# Patient Record
Sex: Female | Born: 1935
Health system: Southern US, Community
[De-identification: ages and names within clinical notes are randomized; demographics above are authoritative.]

## PROBLEM LIST (undated history)

## (undated) DIAGNOSIS — K219 Gastro-esophageal reflux disease without esophagitis: Secondary | ICD-10-CM

## (undated) DIAGNOSIS — F32A Depression, unspecified: Secondary | ICD-10-CM

## (undated) DIAGNOSIS — F329 Major depressive disorder, single episode, unspecified: Secondary | ICD-10-CM

## (undated) DIAGNOSIS — F419 Anxiety disorder, unspecified: Secondary | ICD-10-CM

## (undated) DIAGNOSIS — E7800 Pure hypercholesterolemia, unspecified: Secondary | ICD-10-CM

## (undated) DIAGNOSIS — G309 Alzheimer's disease, unspecified: Secondary | ICD-10-CM

## (undated) DIAGNOSIS — I10 Essential (primary) hypertension: Secondary | ICD-10-CM

## (undated) DIAGNOSIS — C50919 Malignant neoplasm of unspecified site of unspecified female breast: Secondary | ICD-10-CM

## (undated) DIAGNOSIS — F0280 Dementia in other diseases classified elsewhere without behavioral disturbance: Secondary | ICD-10-CM

## (undated) HISTORY — DX: Depression, unspecified: F32.A

## (undated) HISTORY — DX: Essential (primary) hypertension: I10

## (undated) HISTORY — DX: Major depressive disorder, single episode, unspecified: F32.9

## (undated) HISTORY — PX: EYE SURGERY: SHX253

## (undated) HISTORY — DX: Anxiety disorder, unspecified: F41.9

## (undated) HISTORY — DX: Pure hypercholesterolemia, unspecified: E78.00

---

## 1998-08-26 ENCOUNTER — Other Ambulatory Visit: Admission: RE | Admit: 1998-08-26 | Discharge: 1998-08-26 | Payer: Self-pay | Admitting: Obstetrics and Gynecology

## 1999-09-14 ENCOUNTER — Other Ambulatory Visit: Admission: RE | Admit: 1999-09-14 | Discharge: 1999-09-14 | Payer: Self-pay | Admitting: Obstetrics and Gynecology

## 2000-10-12 ENCOUNTER — Other Ambulatory Visit: Admission: RE | Admit: 2000-10-12 | Discharge: 2000-10-12 | Payer: Self-pay | Admitting: *Deleted

## 2002-10-10 ENCOUNTER — Encounter: Admission: RE | Admit: 2002-10-10 | Discharge: 2002-10-11 | Payer: Self-pay | Admitting: Family Medicine

## 2002-11-08 ENCOUNTER — Other Ambulatory Visit: Admission: RE | Admit: 2002-11-08 | Discharge: 2002-11-08 | Payer: Self-pay | Admitting: Obstetrics and Gynecology

## 2003-04-16 ENCOUNTER — Encounter: Admission: RE | Admit: 2003-04-16 | Discharge: 2003-07-15 | Payer: Self-pay | Admitting: Orthopedic Surgery

## 2003-06-18 ENCOUNTER — Encounter (INDEPENDENT_AMBULATORY_CARE_PROVIDER_SITE_OTHER): Payer: Self-pay | Admitting: Specialist

## 2003-06-18 ENCOUNTER — Ambulatory Visit (HOSPITAL_COMMUNITY): Admission: RE | Admit: 2003-06-18 | Discharge: 2003-06-18 | Payer: Self-pay | Admitting: Gastroenterology

## 2006-07-13 ENCOUNTER — Ambulatory Visit (HOSPITAL_COMMUNITY): Admission: RE | Admit: 2006-07-13 | Discharge: 2006-07-14 | Payer: Self-pay | Admitting: General Surgery

## 2006-07-13 ENCOUNTER — Encounter (INDEPENDENT_AMBULATORY_CARE_PROVIDER_SITE_OTHER): Payer: Self-pay | Admitting: Specialist

## 2007-06-27 ENCOUNTER — Encounter: Admission: RE | Admit: 2007-06-27 | Discharge: 2007-06-27 | Payer: Self-pay | Admitting: Gastroenterology

## 2010-08-28 NOTE — Op Note (Signed)
Shelly Cervantes, Shelly Cervantes                           ACCOUNT NO.:  [id number]   MEDICAL RECORD NO.:  [id number]                   PATIENT TYPE:  AMB   LOCATION:  ENDO                                 FACILITY:  [facility]   PHYSICIAN:  Petra Kuba, M.D.                 DATE OF BIRTH:  26-Oct-1935   DATE OF PROCEDURE:  06/18/2003  DATE OF DISCHARGE:                                 OPERATIVE REPORT   PROCEDURE:  Colonoscopy, hot biopsy.   INDICATIONS:  Screening.   Consent was signed after risks, benefits, methods, options thoroughly  discussed multiple times in the office.   MEDICINES USED:  Demerol 60, Versed 7.   PROCEDURE:  Rectal inspection was pertinent for external hemorrhoid, small.  Digital exam was negative.  A video pediatric adjustable colonoscope was  inserted.  There was some difficulty due to her inability to hold air and a  tortuous approximate level of the splenic flexure, which required first  rolling her on her back, then on her right side and then back to her left  side with various abdominal pressures; however, once by this area, was able  to advanced into the cecum.  No abnormality was seen on insertion.  The  cecum was identified by the appendiceal and the ileocecal valve.  Prep was  adequate.  There were some liquid stool that required washing and  suctioning.  The scope was slowly withdrawn.  In the ascending colon, two  tiny polyps were seen, which were hot biopsied x1, and put in the first  container.  The scope was slowly withdrawn.  She did have a tortuous  __________ and when we fell back around that curve, we did try to readvance  around the curves.  She also had difficulty holding air on withdrawal, which  made the possibility of missing tiny lesions.  In the distal sigmoid, two  tiny polyps were seen, which were hot biopsied and put in a second  container.  No other abnormalities were seen as we slowly withdrew back to  the rectum.  Anorectal pull-through and  retroflexion confirmed some small  hemorrhoids.  The scope was reinserted a short way up the left side of the  colon and air was suctioned, scope removed.  The patient tolerated the  procedure well.  There was no immediate complication.   ENDOSCOPIC DIAGNOSES:  1. Internal and external hemorrhoids.  2. Tortuous __________ colon.  3. Tiny distal sigmoid polyp, hot biopsied.  4. Two ascending small polyps, hot biopsied.  5. Otherwise within normal limits to the cecum.   PLAN:  Will await pathology to determine future colonic screening, consider  virtual colonoscopy in the future based on tortuosity.  Happy to see her  back p.r.n., otherwise return to Dr. Tenny Craw for customary health care  maintenance to include yearly rectals and guaiacs.  Petra Kuba, M.D.    MEM/MEDQ  D:  06/18/2003  T:  06/18/2003  Job:  [id number]   cc:   C. Duane Lope, M.D.  [address]  [place]  [place] [address]  Fax: [phone]

## 2010-08-28 NOTE — Op Note (Signed)
Shelly Cervantes, Shelly Cervantes                 ACCOUNT NO.:  [id number]   MEDICAL RECORD NO.:  [id number]          PATIENT TYPE:  AMB   LOCATION:  DAY                          FACILITY:  [facility]   PHYSICIAN:  Angelia Mould. Derrell Lolling, M.D.DATE OF BIRTH:  [birth year]   DATE OF PROCEDURE:  07/13/2006  DATE OF DISCHARGE:                               OPERATIVE REPORT   PREOPERATIVE DIAGNOSIS:  Biliary dyskinesia   POSTOPERATIVE DIAGNOSIS:  Biliary dyskinesia   OPERATION PERFORMED:  Laparoscopic cholecystectomy with intraoperative  cholangiogram.   SURGEON:  Angelia Mould. Derrell Lolling, M.D.   FIRST ASSISTANT:  Wilmon Arms. Tsuei, M.D.   OPERATIVE INDICATIONS:  This is a 75 year old white female in good  health.  For 2 or 3 months she has had some intermittent pain in the  right upper abdomen.  She had episode in January which lasted 2 weeks  with nausea.  It has not been that severe, but has been intermittent.  She has had a gallbladder ultrasound which looked normal; but a  hepatobiliary scan shows a markedly decreased ejection fraction of only  2%.  It is felt that her symptoms are quite consistent with a biliary  dyskinesia; and cholecystectomy was offered.  She agreed with this; and  is brought to operating room electively.   OPERATIVE FINDINGS:  The gallbladder was chronically inflamed.  There  were fairly extensive dense chronic adhesions to the lower body and  infundibulum of the gallbladder which took at least 10 minutes to take  down.  The dissection of the gallbladder out of its bed also revealed  some chronic inflammation.  The gallbladder was slightly discolored.  The anatomy of the cystic duct, cystic artery, and common bile duct were  traditional.  The intraoperative cholangiogram showed normal  intrahepatic and extrahepatic biliary anatomy, no filling defects, no  obstruction, and good flow of contrast into the duodenum.  The liver,  stomach, duodenum, small intestine, and large intestine were  grossly  normal to inspection.   OPERATIVE TECHNIQUE:  Following the induction of general endotracheal  anesthesia the patient was identified as to correct patient and correct  procedure.  The abdomen was prepped and draped in a sterile fashion.  Intravenous antibiotics were given prior to the incision.  Then 0.5%  Marcaine with epinephrine was used as a local infiltration anesthetic.   A transverse incision was made at the lower rim of the umbilicus,  through a previous laparoscopy scar.  The fascia was incised in the  midline.  The abdominal cavity was entered under direct vision.  There  were no adhesions around the umbilicus.  A 10-mm Hassan trocar was  inserted and secured with a pursestring suture of #0 Vicryl.  A  pneumoperitoneum was created.  A video camera was inserted with  visualization and findings as described above.  A 10-mm trocar was  placed in the subxiphoid region; and two 5-mm trocars placed in the  right midabdomen.  The gallbladder fundus was elevated.  We had to  carefully take down a lot of adhesions, for about 10 minutes, using  sharp dissection and  electrocautery.  This went well.  We were able to  retract the infundibulum laterally.  The cystic artery was somewhat  anterior and we isolated the cystic artery as it went on to the wall of  the gallbladder; secured it with multiple metal clips and divided it.   We dissected out the cystic duct.  A cholangiogram catheter was inserted  into the cystic duct.  A cholangiogram was obtained using the C-arm.  The cholangiogram was normal.  Cholangiogram catheter was removed; and  the cystic duct secured with multiple metal clips and divided.  The  gallbladder was dissected from its bed with electrocautery placed in a  specimen bag and removed.  We had a little bit of bleeding high on the  bed of the gallbladder, near the liver edge, which required some  cautery; but that was a 100% hemostatic at the end of the case.    We irrigated fairly extensively, under the liver; and at the end the  case the irrigation fluid was completely clear.  There was no evidence  of any bleeding or bile leak whatsoever.  The trocars were removed under  direct vision.  There was no bleeding from the trocar sites.  Pneumoperitoneum was released.  Fascia at the umbilicus was closed with  #0 Vicryl sutures.  The skin incisions were closed with subcuticular  sutures of 4-0 Monocryl and Steri-Strips.  Clean bandages were placed;  and the patient taken to the recovery room in stable condition.  Estimated blood loss was about 10 mL.  Complications none.  Sponge  needle and instrument counts were correct.      Angelia Mould. Derrell Lolling, M.D.  Electronically Signed     HMI/MEDQ  D:  07/13/2006  T:  07/13/2006  Job:  [id number]   cc:   C. Duane Lope, M.D.  Fax: [phone]

## 2012-06-12 ENCOUNTER — Other Ambulatory Visit: Payer: Self-pay | Admitting: Neurology

## 2012-06-12 DIAGNOSIS — R413 Other amnesia: Secondary | ICD-10-CM

## 2012-06-12 DIAGNOSIS — G3184 Mild cognitive impairment, so stated: Secondary | ICD-10-CM

## 2012-06-26 ENCOUNTER — Ambulatory Visit
Admission: RE | Admit: 2012-06-26 | Discharge: 2012-06-26 | Disposition: A | Discharge status: Home / Self Care | Payer: Medicare Other | Source: Ambulatory Visit | Attending: Neurology | Admitting: Neurology

## 2012-06-26 DIAGNOSIS — R413 Other amnesia: Secondary | ICD-10-CM

## 2012-06-26 DIAGNOSIS — G3184 Mild cognitive impairment, so stated: Secondary | ICD-10-CM

## 2012-06-30 ENCOUNTER — Telehealth: Payer: Self-pay | Admitting: *Deleted

## 2012-06-30 ENCOUNTER — Telehealth: Payer: Self-pay | Admitting: Neurology

## 2012-06-30 NOTE — Telephone Encounter (Signed)
Brett Canales: Please call patient to  relay the results of her recent brain MRI report. She had her test on 06/26/2012 and it is reported as mildly abnormal showing mild atrophy, meaning mild volume loss. She also had some chronic small vessel disease which means that with time there is mild atherosclerosis in the small blood vessels of the brain. Both of the findings of her brain scan may actually be in keeping with her age. There is really nothing different that we need to do differently at this time. We had talked about her taking a baby aspirin if she can tolerate it. I would like to see if she can start taking a baby aspirin if she can tolerate it from the stomach standpoint. Thanks

## 2012-07-04 NOTE — Telephone Encounter (Signed)
CONTACTED PT AND RELAYED RESULTS.  SHE WILL START TAKING BABY ASPIRIN AGAIN.

## 2012-08-04 ENCOUNTER — Ambulatory Visit: Payer: Self-pay | Admitting: Neurology

## 2016-05-05 DIAGNOSIS — H1852 Epithelial (juvenile) corneal dystrophy: Secondary | ICD-10-CM | POA: Diagnosis not present

## 2016-05-05 DIAGNOSIS — H40053 Ocular hypertension, bilateral: Secondary | ICD-10-CM | POA: Diagnosis not present

## 2016-05-05 DIAGNOSIS — H35033 Hypertensive retinopathy, bilateral: Secondary | ICD-10-CM | POA: Diagnosis not present

## 2016-05-05 DIAGNOSIS — H401134 Primary open-angle glaucoma, bilateral, indeterminate stage: Secondary | ICD-10-CM | POA: Diagnosis not present

## 2016-05-20 DIAGNOSIS — R69 Illness, unspecified: Secondary | ICD-10-CM | POA: Diagnosis not present

## 2016-05-20 DIAGNOSIS — J329 Chronic sinusitis, unspecified: Secondary | ICD-10-CM | POA: Diagnosis not present

## 2016-05-27 DIAGNOSIS — Z7982 Long term (current) use of aspirin: Secondary | ICD-10-CM | POA: Diagnosis not present

## 2016-05-27 DIAGNOSIS — G4700 Insomnia, unspecified: Secondary | ICD-10-CM | POA: Diagnosis not present

## 2016-05-27 DIAGNOSIS — Z6821 Body mass index (BMI) 21.0-21.9, adult: Secondary | ICD-10-CM | POA: Diagnosis not present

## 2016-05-27 DIAGNOSIS — J0100 Acute maxillary sinusitis, unspecified: Secondary | ICD-10-CM | POA: Diagnosis not present

## 2016-05-27 DIAGNOSIS — Z0000 Encounter for general adult medical examination without abnormal findings: Secondary | ICD-10-CM | POA: Diagnosis not present

## 2016-05-27 DIAGNOSIS — I10 Essential (primary) hypertension: Secondary | ICD-10-CM | POA: Diagnosis not present

## 2016-05-27 DIAGNOSIS — R69 Illness, unspecified: Secondary | ICD-10-CM | POA: Diagnosis not present

## 2016-05-27 DIAGNOSIS — R42 Dizziness and giddiness: Secondary | ICD-10-CM | POA: Diagnosis not present

## 2016-06-17 ENCOUNTER — Encounter: Payer: Self-pay | Admitting: Neurology

## 2016-06-17 ENCOUNTER — Ambulatory Visit (INDEPENDENT_AMBULATORY_CARE_PROVIDER_SITE_OTHER): Payer: Medicare HMO | Admitting: Neurology

## 2016-06-17 VITALS — BP 122/68 | HR 70 | Resp 16 | Ht 65.0 in | Wt 125.0 lb

## 2016-06-17 DIAGNOSIS — R69 Illness, unspecified: Secondary | ICD-10-CM | POA: Diagnosis not present

## 2016-06-17 DIAGNOSIS — F0280 Dementia in other diseases classified elsewhere without behavioral disturbance: Secondary | ICD-10-CM | POA: Diagnosis not present

## 2016-06-17 DIAGNOSIS — G301 Alzheimer's disease with late onset: Secondary | ICD-10-CM | POA: Diagnosis not present

## 2016-06-17 MED ORDER — MEMANTINE HCL-DONEPEZIL HCL ER 28-10 MG PO CP24: 1.0000 | ORAL_CAPSULE | Freq: Every day | ORAL | 5 refills | Status: DC

## 2016-06-17 NOTE — Patient Instructions (Signed)
Please increase water intake to about 6 glasses (8 oz each) per day. Please reduce and eliminate alcohol altogether, as the medications you take don't combine well with alcohol.  Try to exercise daily.  We will request a formal neuropsychological test (aka cognitive testing) for your memory complaints. This requires a referral to a trained and licensed neuropsychologist and will be a separate appointment at a different clinic.  I will prescribe a new medication called Namzaric 28-10 mg, which is a combination pill with Namenda and Aricept in one. This may replace the donepezil and memantine in the near future. It will likely require prior authorization.

## 2016-06-17 NOTE — Progress Notes (Signed)
Subjective:    Patient ID: Shelly Cervantes is a 81 y.o. female.  HPI     Star Age, MD, PhD [facility] [address], [address] [place], [place] [address]  Dear Dr. Harrington Challenger,   I saw your patient, Shelly Cervantes, upon your kind request in my neurologic clinic today for initial consultation of memory loss. The patient is accompanied by her husband and daughter  today. As you know, [name] is a 81 year old right-handed woman with an underlying medical history of anxiety, depression, hyperlipidemia, hypertension, insomnia, low back pain, and glaucoma, whom I have seen before over 3 years ago for memory loss.  I met her in February 2014, at which time her MMSE was 27 out of 30, clock drawing was 4 out of 4. Of note, she did not show for a follow-up appointment which was scheduled for April 2014. She had a brain MRI without contrast on 06/26/2012 which showed mild perisylvian, subcortical mesial temporal atrophy, mild chronic small vessel ischemic disease.  She was started on donepezil in November 2015, has been on 5 mg once daily in the evening. She may have tried a higher dose per husband, but he reports that she had GI side effects. She has been on Namenda generic 10 mg once daily in the morning since September 2017. Unclear if this was escalated to 10 mg twice daily but she may have had side effects on a higher dose per husband. She was started on BuSpar in September 2017 as well for anxiety. She has been on Wellbutrin long-acting for over one year for her depression. Mood wise, she has been doing well, feels stable, per husband, she has done quite well with respect to depression and anxiety in the past 6 months. She no longer drives, she has not driven a car and over 18 months per husband and daughter. She has 2 daughters, 3 grandchildren and one great-grandchild. Bedtime is early, around 7 PM or 8 PM, often more closer to 7. Wake up time is around 6 AM. She does take a  couple of naps during the day. She does not always drink enough water and does not exercise regularly. She drinks coffee 1 or 2 cups per morning and some juice, maybe 1 glass per day and wine, 1 glass every other day or so. Her memory loss has worsened over time. She does not have a telltale history of dementia and her family, her older sister has memory loss, she is 13 years older. Per previous records: One brother died in his 63s from [place]. Mother lived to be 55 years old, father died of lung cancer at age 18. She is a nonsmoker.  Her Past Medical History Is Significant For: Past Medical History:  Diagnosis Date  . High cholesterol   . Hypertension     Her Past Surgical History Is Significant For: No past surgical history on file.  Her Family History Is Significant For: Family History  Problem Relation Age of Onset  . Cancer Mother   . Cancer Father     Her Social History Is Significant For: Social History   Social History  . Marital status: Married    Spouse name: N/A  . Number of children: 12  . Years of education: 2   Occupational History  . Retired     Social History Main Topics  . Smoking status: Never Smoker  . Smokeless tobacco: Never Used  . Alcohol use Yes  . Drug use:  No  . Sexual activity: Not Asked   Other Topics Concern  . None   Social History Narrative   Denies caffeine use     Her Allergies Are:  Allergies  Allergen Reactions  . Codeine   :   Her Current Medications Are:  Outpatient Encounter Prescriptions as of 06/17/2016  Medication Sig  . aspirin 81 MG tablet Take 81 mg by mouth daily.  Marland Kitchen buPROPion (WELLBUTRIN XL) 300 MG 24 hr tablet Take 300 mg by mouth every morning.  . busPIRone (BUSPAR) 15 MG tablet TAKE 1/2-1 TABLET TWICE A DAY ORALLY 30 DAYS  . donepezil (ARICEPT) 5 MG tablet Take 5 mg by mouth at bedtime.  Marland Kitchen lisinopril (PRINIVIL,ZESTRIL) 20 MG tablet Take 20 mg by mouth daily.  . memantine (NAMENDA) 10 MG tablet TAKE 1/2-1 TABLET  TWICE A DAY ORALLY 30 DAY(S)  . traZODone (DESYREL) 50 MG tablet Take 50 mg by mouth at bedtime.  . Memantine HCl-Donepezil HCl (NAMZARIC) 28-10 MG CP24 Take 1 capsule by mouth daily.   No facility-administered encounter medications on file as of 06/17/2016.   :   Review of Systems:  Out of a complete 14 point review of systems, all are reviewed and negative with the exception of these symptoms as listed below:  Review of Systems  Neurological:       Patient has been seen here for memory loss 3+ years ago. Daughter asks if patient is on the appropriate medication and if any further testing needs to be done.     Objective:  Neurologic Exam  Physical Exam Physical Examination:   Vitals:   06/17/16 1412  BP: 122/68  Pulse: 70  Resp: 16    General Examination: The patient is a very pleasant 81 y.o. female in no acute distress. She appears well-developed and well-nourished and well groomed. She does not provide her history fully. Her husband provides most of her history. Her daughter provides some details as well.  HEENT: Normocephalic, atraumatic, pupils are equal, round and reactive to light and accommodation. Extraocular tracking is good without limitation to gaze excursion or nystagmus noted. Normal smooth pursuit is noted. Hearing is grossly intact. Tympanic membranes are clear bilaterally. Face is symmetric with normal facial animation and normal facial sensation. Speech is clear with no dysarthria noted. There is no hypophonia. There is no lip, neck/head, jaw or voice tremor. Neck is supple with full range of passive and active motion. There are no carotid bruits on auscultation. Oropharynx exam reveals: mild mouth dryness, adequate dental hygiene and mild airway crowding, due to smaller airway entry and redundant soft palate. Mallampati is class II. Tongue protrudes centrally and palate elevates symmetrically.   Chest: Clear to auscultation without wheezing, rhonchi or crackles  noted.  Heart: S1+S2+0, regular and normal without murmurs, rubs or gallops noted.   Abdomen: Soft, non-tender and non-distended with normal bowel sounds appreciated on auscultation.  Extremities: There is no pitting edema in the distal lower extremities bilaterally. Pedal pulses are intact.  Skin: Warm and dry without trophic changes noted.  Musculoskeletal: exam reveals no obvious joint deformities, tenderness or joint swelling or erythema.   Neurologically:  Mental status: The patient is awake, alert and oriented to place, self, circumstance. Her immediate and remote memory, attention, language skills and fund of knowledge are impaired. Speech is clear with normal prosody and enunciation. Thought process is linear. Mood is normal and affect is blunted. She is mildly anxious appearing.  On 06/17/2016: MMSE: 23/30, CDT: 4/4,  AFT: 7/min.   Cranial nerves II - XII are as described above under HEENT exam. In addition: shoulder shrug is normal with equal shoulder height noted. Motor exam: Normal bulk, strength for age and tone is noted. There is no drift, tremor or rebound. Romberg is positive. Reflexes are 1-2+ throughout. Babinski: Toes are flexor bilaterally. Fine motor skills and coordination: intact with normal finger taps, normal hand movements, normal rapid alternating patting, normal foot taps and normal foot agility.  Cerebellar testing: No dysmetria or intention tremor.  Sensory exam: intact to light touch in the upper and lower extremities.  Gait, station and balance: She stands easily. No veering to one side is noted. No leaning to one side is noted. Posture is age-appropriate and stance is narrow based. Balance is mildly impaired, tandem walk is not possible for her. She is wearing fairly high heeled sandals, I asked her to take this off for gait testing and reflex testing.               Assessment and Plan:   In summary, MYRISSA CHIPLEY is a very pleasant 81 y.o.-year old female with  an underlying medical history of anxiety, depression, hyperlipidemia, hypertension, insomnia, low back pain, and glaucoma, who presents for evaluation of her memory loss. Her history, physical examination, memory scores and brain MRI testing are concerning for Alzheimer's disease without behavioral disturbance. She does have a history of anxiety and depression which are under control on current medications. She is on lower doses for Aricept and Namenda generic, we could try to escalate these one after another if possible. I would like to see if she can take the combination medication called Namzaric 28-10 milligrams strength, especially if she had side effects on the higher doses of donepezil and memantine. We will try to submit this for prior authorization. In the interim, she can continue with her current medications. She is advised to refrain from drinking alcohol and taper off as she is taking multiple psychotropic medications that will not combine well with alcohol consumption. She is furthermore advised to drink more water. Her memory scores have declined compared to 3 years ago which is expected. We will also request formal neuropsychological testing with a neuropsychologist. She is agreeable to a referral. I had a long chat with the patient and her family about my findings and the diagnosis of memory loss and dementia, its prognosis and treatment options. Implications of diagnosis explained at length with the patient and caregiver. We talked about medical treatments and non-pharmacological approaches. We talked about maintaining a healthy lifestyle in general and staying active mentally and physically. I stressed the importance of regular exercise, within of course the patient's own mobility limitations.  I suggested a 3 month follow-up, sooner as needed, I answered all the questions today and the patient and her family were in agreement.   Thank you very much for allowing me to participate in the care  of this nice patient. If I can be of any further assistance to you please do not hesitate to call me at [phone].  Sincerely,   Star Age, MD, PhD

## 2016-06-18 ENCOUNTER — Encounter: Payer: Self-pay | Admitting: Psychology

## 2016-07-07 DIAGNOSIS — R69 Illness, unspecified: Secondary | ICD-10-CM | POA: Diagnosis not present

## 2016-07-07 DIAGNOSIS — G250 Essential tremor: Secondary | ICD-10-CM | POA: Diagnosis not present

## 2016-07-21 DIAGNOSIS — R0981 Nasal congestion: Secondary | ICD-10-CM | POA: Diagnosis not present

## 2016-07-21 DIAGNOSIS — R42 Dizziness and giddiness: Secondary | ICD-10-CM | POA: Diagnosis not present

## 2016-07-21 DIAGNOSIS — H6980 Other specified disorders of Eustachian tube, unspecified ear: Secondary | ICD-10-CM | POA: Diagnosis not present

## 2016-07-26 ENCOUNTER — Encounter: Payer: Self-pay | Admitting: Psychology

## 2016-07-26 ENCOUNTER — Ambulatory Visit (INDEPENDENT_AMBULATORY_CARE_PROVIDER_SITE_OTHER): Payer: Medicare HMO | Admitting: Psychology

## 2016-07-26 DIAGNOSIS — F0280 Dementia in other diseases classified elsewhere without behavioral disturbance: Secondary | ICD-10-CM

## 2016-07-26 DIAGNOSIS — G301 Alzheimer's disease with late onset: Secondary | ICD-10-CM

## 2016-07-26 NOTE — Progress Notes (Signed)
   Neuropsychology Note  Shelly Cervantes came in today for 1 hour of neuropsychological testing with technician, Shelly Cervantes, BS, under the supervision of Dr. Macarthur Critchley. The patient appeared anxious and complained of a sinus headache, per behavioral observation or via self-report to the technician. Rest breaks were offered. Shelly Cervantes will return within 2 weeks for a feedback session with Dr. Si Raider at which time her test performances, clinical impressions and treatment recommendations will be reviewed in detail. The patient understands she can contact our office should she require our assistance before this time.  Full report to follow.

## 2016-07-26 NOTE — Progress Notes (Signed)
NEUROPSYCHOLOGICAL INTERVIEW (CPT: [id number])  Name: Shelly Cervantes Date of Birth: [birth year] Date of Interview: 07/26/2016  Reason for Referral:  Shelly Cervantes is a 81 y.o. female who is referred for neuropsychological evaluation by Dr. Star Age of [facility] due to concerns about memory loss. This patient is accompanied in the office by her husband who supplements the history.  History of Presenting Problem:  Shelly Cervantes has seen Dr. Rexene Alberts for neurologic evaluation in 2014 and more recently on 06/17/2016 for memory loss. She is prescribed donepezil and Namenda for dementia (probable Alzheimer's disease). MRI of the brain performed on 06/23/2016 reportedly revealed mild perisylvian, subcortical and mesial temporal atrophy, as well as mild chronic small vessel ischemic changes. Her MMSE score in 05/2012 was 27/30; on 06/17/2016 it was 23/30.   At today's appointment, the patient endorsed memory decline with gradual onset and progressive worsening. Her husband reported first noticing cognitive decline around the time she stopped working. 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 The patient reported sadness about her cognitive changes, stating, "I know where I'm at and what's happening - I know I'm not as sharp as I used to be." She and her husband reported short term memory loss characterized by forgetfulness for recent conversations and events, misplacing/losing items, repeating questions, and disorientation when driving. About a year ago, she was driving to her hair dresser's and could not recall how to get there. At that point, her husband took over all the driving. He also manages her medications (prepares the weekly pillbox and administers them daily) and is trying to do more of the cooking. She is not doing much cooking anymore and seems to forget how to make things she used to make. She does use the calendar to remember  appointments. She used to manage all the finances and bills but her husband is helping more with that now.   The patient's husband reports he is concerned about "how depressed she can get". She gets down about not being able to do things she used to do, like the housekeeping and cooking. More recently, she has started declining to go places she used to really enjoy, like going out to lunch with her daughter or going dancing with her husband. She will still do these things occasionally but she has more of a tendency to say she "doesn't feel up to it" an hour or two before they are supposed to go. However, when she does go dancing or do other social activities, she enjoys them. She also attends church regularly. She has not had any hallucinations or behavioral disturbance. She denied suicidal ideation or intention. She has been on Wellbutrin for about 1 1/2 to 2 years. She also takes Buspar. Prior psychiatric history was denied.  With regard to physical functioning, she has been having increased unsteadiness on her feet for the past 6 months. She has had one fall, coming into her home. She bumped her head but did not lose consciousness. She has not had any other falls. She notes that she has had sinus problems all her life but this year seems to be particularly bad. She recently got a steroid injection. Her sinus problem causes dizziness.  Her husband reports her appetite/food intake is reduced. She is sleeping more (typically 7:30 pm to 6:30 am) and also likes to take a nap in the afternoon. Her husband would like to get them in a regular  exercise program since they are not dancing as much as they used to. He also would like her to start playing the piano again, to help with mental stimulation (she played >20 years ago).  Family history is significant for dementia in the patient's older sister (8 years her senior).   Social History: Shelly Cervantes was born and raised in [place]. 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 She has been married once before and is divorced. She has been married to her current husband, Shelly Cervantes, for 24 years. She has two daughters from her first marriage, and both live in [place]. She is close with them. She has three grandchildren and one great-grandchild. [name] lives with her husband and their pet dog, Shelly Cervantes. She reportedly has wine a couple times a week, 1-2 glasses. She was never a smoker or tobacco user. She denied illicit substance use.   Medical History: Past Medical History:  Diagnosis Date  . High cholesterol   . Hypertension     Current Medications:  Outpatient Encounter Prescriptions as of 07/26/2016  Medication Sig  . aspirin 81 MG tablet Take 81 mg by mouth daily.  Marland Kitchen buPROPion (WELLBUTRIN XL) 300 MG 24 hr tablet Take 300 mg by mouth every morning.  . busPIRone (BUSPAR) 15 MG tablet TAKE 1/2-1 TABLET TWICE A DAY ORALLY 30 DAYS  . donepezil (ARICEPT) 5 MG tablet Take 5 mg by mouth at bedtime.  Marland Kitchen lisinopril (PRINIVIL,ZESTRIL) 20 MG tablet Take 20 mg by mouth daily.  . memantine (NAMENDA) 10 MG tablet TAKE 1/2-1 TABLET TWICE A DAY ORALLY 30 DAY(S)  . Memantine HCl-Donepezil HCl (NAMZARIC) 28-10 MG CP24 Take 1 capsule by mouth daily.  . traZODone (DESYREL) 50 MG tablet Take 50 mg by mouth at bedtime.   No facility-administered encounter medications on file as of 07/26/2016.      Behavioral Observations:   Appearance: Neatly and appropriately dressed and groomed Gait: Ambulated with her husband's assistance. Unsteady gait. Speech: Fluent; normal rate, rhythm and volume Thought process: Generally linear Affect: Full, anxious Interpersonal: Pleasant, slightly disinhibited, jovial and joking during the clinical interview, low frustration tolerance during the testing session   TESTING: There is medical necessity to proceed with neuropsychological  assessment as the results will be used to aid in differential diagnosis and clinical decision-making and to inform specific treatment recommendations. Per the patient, her husband and medical records reviewed, there has been a change in cognitive functioning and a reasonable suspicion of dementia due to Alzheimer's disease. Following the clinical interview, the patient completed a full battery of neuropsychological testing with my psychometrician under my supervision. The patient required extra encouragement and redirection in order to complete the testing.   PLAN: Mrs. Bibian and her husband will return to see me for a follow-up session at which time her test performances and my impressions and treatment recommendations will be reviewed in detail.   Full neuropsychological evaluation report to follow.

## 2016-08-04 NOTE — Progress Notes (Signed)
NEUROPSYCHOLOGICAL EVALUATION   Name:    Shelly Cervantes  Date of Birth:   [birth year] Date of Interview:  07/26/2016 Date of Testing:  07/26/2016   Date of Feedback:  08/05/2016       Background Information:  Reason for Referral:  Shelly Cervantes is a 81 y.o. female referred by Dr. Star Age to assess her current level of cognitive functioning and assist in differential diagnosis. The current evaluation consisted of a review of available medical records, an interview with the patient and her husband, and the completion of a neuropsychological testing battery. Informed consent was obtained.  History of Presenting Problem:  Shelly Cervantes has seen Dr. Rexene Alberts for neurologic evaluation in 2014 and more recently on 06/17/2016 for memory loss. She is prescribed donepezil and Namenda for dementia (probable Alzheimer's disease). MRI of the brain performed on 06/23/2016 reportedly revealed mild perisylvian, subcortical and mesial temporal atrophy, as well as mild chronic small vessel ischemic changes. Her MMSE score in 05/2012 was 27/30; on 06/17/2016 it was 23/30.   At today's appointment, the patient endorsed memory decline with gradual onset and progressive worsening. Her husband reported first noticing cognitive decline around the time she stopped working. 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 The patient reported sadness about her cognitive changes, stating, "I know where I'm at and what's happening - I know I'm not as sharp as I used to be." She and her husband reported short term memory loss characterized by forgetfulness for recent conversations and events, misplacing/losing items, repeating questions, and disorientation when driving. About a year ago, she was driving to her hair dresser's and could not recall how to get there. At that point, her husband took over all the driving. He also manages her medications (prepares the weekly pillbox and  administers them daily) and is trying to do more of the cooking. She is not doing much cooking anymore and seems to forget how to make things she used to make. She does use the calendar to remember appointments. She used to manage all the finances and bills but her husband is helping more with that now.   The patient's husband reports he is concerned about "how depressed she can get". She gets down about not being able to do things she used to do, like the housekeeping and cooking. More recently, she has started declining to go places she used to really enjoy, like going out to lunch with her daughter or going dancing with her husband. She will still do these things occasionally but she has more of a tendency to say she "doesn't feel up to it" an hour or two before they are supposed to go. However, when she does go dancing or do other social activities, she enjoys them. She also attends church regularly. She has not had any hallucinations or behavioral disturbance. She denied suicidal ideation or intention. She has been on Wellbutrin for about 1 1/2 to 2 years. She also takes Buspar. Prior psychiatric history was denied.  With regard to physical functioning, she has been having increased unsteadiness on her feet for the past 6 months. She has had one fall, coming into her home. She bumped her head but did not lose consciousness. She has not had any other falls. She notes that she has had sinus problems all her life but this year seems to be particularly bad. She recently got a steroid injection. Her sinus  problem causes dizziness.  Her husband reports her appetite/food intake is reduced. She is sleeping more (typically 7:30 pm to 6:30 am) and also likes to take a nap in the afternoon. Her husband would like to get them in a regular exercise program since they are not dancing as much as they used to. He also would like her to start playing the piano again, to help with mental stimulation (she played >20  years ago).  Family history is significant for dementia in the patient's older sister (8 years her senior).   Social History: Shelly Cervantes was born and raised in [place]. 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 She has been married once before and is divorced. She has been married to her current husband, Shelly Cervantes, for 24 years. She has two daughters from her first marriage, and both live in [place]. She is close with them. She has three grandchildren and one great-grandchild. [name] lives with her husband and their pet dog, Shelly Cervantes. She reportedly has wine a couple times a week, 1-2 glasses. She was never a smoker or tobacco user. She denied illicit substance use.   Medical History:  Past Medical History:  Diagnosis Date  . High cholesterol   . Hypertension     Current medications:  Outpatient Encounter Prescriptions as of 08/05/2016  Medication Sig  . aspirin 81 MG tablet Take 81 mg by mouth daily.  Marland Kitchen buPROPion (WELLBUTRIN XL) 300 MG 24 hr tablet Take 300 mg by mouth every morning.  . busPIRone (BUSPAR) 15 MG tablet TAKE 1/2-1 TABLET TWICE A DAY ORALLY 30 DAYS  . donepezil (ARICEPT) 5 MG tablet Take 5 mg by mouth at bedtime.  Marland Kitchen lisinopril (PRINIVIL,ZESTRIL) 20 MG tablet Take 20 mg by mouth daily.  . memantine (NAMENDA) 10 MG tablet TAKE 1/2-1 TABLET TWICE A DAY ORALLY 30 DAY(S)  . Memantine HCl-Donepezil HCl (NAMZARIC) 28-10 MG CP24 Take 1 capsule by mouth daily.  . traZODone (DESYREL) 50 MG tablet Take 50 mg by mouth at bedtime.   No facility-administered encounter medications on file as of 08/05/2016.      Current Examination:  Behavioral Observations:   Appearance: Neatly and appropriately dressed and groomed Gait: Ambulated with her husband's assistance. Unsteady gait. Speech: Fluent; normal rate, rhythm and volume Thought process: Generally linear Affect: Full,  anxious Interpersonal: Pleasant, slightly disinhibited, jovial and joking during the clinical interview, very low frustration tolerance during the testing session and required significant encouragement/redirection in order to complete the testing session Orientation: Oriented to person but was 1 year off on current age (reported "89"). Disoriented to time (one day off on the day of the week, two months off, did not know the date, reported year as 2014). Oriented to city but not to office/street name. Could not name the current President or his predecessor.  Tests Administered: . Test of Premorbid Functioning (TOPF) . Wechsler Adult Intelligence Scale-Fourth Edition (WAIS-IV): Similarities, Music therapist, Coding and Digit Span subtests . Wechsler Memory Scale-Fourth Edition (WMS-IV) Older Adult Version (ages 87-90): Logical Memory I, II and Recognition subtests  . [employer] Verbal Learning Test - 2nd Edition (CVLT-2) Short Form . Repeatable Battery for the Assessment of Neuropsychological Status (RBANS) Form A:  Figure Copy and Recall subtests and Semantic Fluency subtest . Neuropsychological Assessment Battery (NAB) Language Module, Form 1: Naming subtest . Boston Diagnostic Aphasia Examination: Complex Ideational Material subtest . Controlled  Oral Word Association Test (COWAT) . Trail Making Test A and B . Clock drawing test . Geriatric Depression Scale (GDS) 15 Item . Generalized Anxiety Disorder - 7 item screener (GAD-7)  Test Results: Note: Standardized scores are presented only for use by appropriately trained professionals and to allow for any future test-retest comparison. These scores should not be interpreted without consideration of all the information that is contained in the rest of the report. The most recent standardization samples from the test publisher or other sources were used whenever possible to derive standard scores; scores were corrected for age, gender, ethnicity and  education when available.   Test Scores:  Test Name Raw Score Standardized Score Descriptor  TOPF 29/70 SS= 91 Average  WAIS-IV Subtests     Similarities 11/36 ss= 5 Borderline  Block Design 16/66 ss= 6 Low average  Coding 17/135 ss= 4 Impaired  Digit Span Forward 15/16 ss= 17 Very superior  Digit Span Backward 11/16 ss= 15 Superior  WMS-IV Subtests     LM I 7/53 ss= 2 Impaired  LM II 0/39 ss= 1 Severely impaired  LM II Recognition 12/23 Cum %: 3-9 Impaired  RBANS Subtests     Figure Copy 12/20 Z= -3.2 Severely impaired  Figure Recall 0/20 Z= -3 Severely impaired  Semantic Fluency 9/40 Z= -2.1 Impaired  CVLT-II Scores     Trial 1 1/9 Z= -3 Severely impaired  Trial 4 3/9 Z= -2.5 Impaired  Trials 1-4 total 7/36 T= 12 Severely impaired  SD Free Recall 1/9 Z= -2 Impaired  LD Free Recall 0/9 Z= -2 Impaired  LD Cued Recall 1/9 Z= -3 Severely impaired  Recognition Discriminability 9/9 hits,15 false positives Z= -2 Impaired  Forced Choice Recognition 8/9  Abnormal  NAB Naming 19/31 T= 19 Severely impaired  BDAE Complex Ideational Material 6/12  Impaired  COWAT-FAS 41 T= 54 Average  COWAT-Animals 7 T= 28 Impaired  Trail Making Test A  87" 1 error T= 28 Impaired  Trail Making Test B  Pt unable   Severely impaired  Clock Drawing   Impaired  GDS-15 1/15  WNL  GAD-7 5/21  Mild      Description of Test Results:  Premorbid verbal intellectual abilities were estimated to have been within the average range based on a test of word reading. Psychomotor processing speed was impaired. Auditory attention and working memory were areas of relative strength, ranging from very superior to superior. Visual-spatial construction ranged from low average to severely impaired. Language abilities were impaired. Specifically, confrontation naming was severely impaired, and semantic verbal fluency was impaired. Auditory comprehension of complex ideational material was severely impaired. With regard to  verbal memory, encoding and acquisition of non-contextual information (i.e., word list) was severely impaired. After a brief distracter task, free recall was impaired (1/9 items recalled). After a delay, free recall was impaired (0/9 items recalled). Cued recall was severely impaired (1/9 items recalled). Performance on a yes/no recognition task was impaired with a significantly elevated number (15) of false positive errors. On another verbal memory test, encoding and acquisition of contextual auditory information (i.e., short stories) was impaired. After a delay, free recall was severely impaired. Performance on a yes/no recognition task also was impaired. With regard to non-verbal memory, delayed free recall of visual information was severely impaired (no aspects of the original stimulus were recalled). Executive functioning was impaired on several tasks. Mental flexibility and set-shifting were severely impaired; she was unable to complete Trails [address]. Verbal abstract reasoning was  borderline impaired. Performance on a clock drawing task was impaired. Meanwhile, verbal fluency with phonemic search restrictions was average, and an area of relative strength. On self-report questionnaires, the patient's responses were not  indicative of clinically significant depression, but she did endorse mild anxiety at the present time. Symptoms endorsed included: mild nervousness, inability to control worrying, frequent worrying, difficulty relaxing, and irritability.   Clinical Impressions: Mild to moderate dementia most likely secondary to Alzheimer's disease. Results of the current cognitive evaluation reveal several areas of impairment, including temporal orientation, verbal and non-verbal memory, semantic retrieval (i.e., semantic fluency and confrontation naming), mental flexibility and set-shifting, and abstract reasoning. Additionally, there is evidence that her cognitive deficits are interfering with her ability to  manage complex tasks, such as managing finances, medications, driving and cooking. As such, diagnostic criteria for a dementia syndrome are met.   The patient's cognitive profile is suggestive of hippocampal consolidation dysfunction and medial-temporal lobe involvement. Alzheimer's disease is the most likely etiology, given her cognitive profile clinical features and disease course. Due to the level of impairment on testing and functional decline in daily life, she is considered in the mild to moderate stage of dementia. She is experiencing increased depression and anxiety which are considered secondary to her dementia as opposed to a primary psychiatric disorder.   Recommendations: Based on the findings of the present evaluation, the following recommendations are offered:  1. The patient is considered an appropriate candidate for cholinesterase inhibitor therapy. She should continue treatment with Aricept/Namenda. 2. The patient should continue to receive assistance with complex ADLs, including transportation, finances, medications, cooking and transportation. She is not driving.  3. The patient's family may wish to attend a local dementia caregiver support group and/or seek additional information from the Alzheimer's Association ([employer]).  4. The patient should continue to participate in activities which provide mental stimulation, safe cardiovascular exercise, and social interaction. Listening to music regularly is encouraged as this is something that she enjoys and is shown to improve mood/engagement in individuals with AD.  5. The patient's husband is most concerned about the patient's reduce interest in activities she used to enjoy, and she does demonstrate some depression and anxiety. She is already prescribed Wellbutrin and Buspirone. Consultation with a geriatric psychiatrist such as Dr. Norma Fredrickson may be helpful in determining if a change in medication needs to be made.       Feedback to Patient: HARLEA GOETZINGER her husband and her daughter returned for a feedback appointment on 08/05/2016 to review the results of her neuropsychological evaluation with this provider. 40 minutes face-to-face time was spent reviewing her test results, my impressions and my recommendations as detailed above.    Total time spent on this patient's case: [id number] unit for interview with psychologist; [phone] units of testing by psychometrician under psychologist's supervision; [phone] units for medical record review, scoring of neuropsychological tests, interpretation of test results, preparation of this report, and review of results to the patient by psychologist.      Thank you for your referral of SHADOE CRYAN. Please feel free to contact me if you have any questions or concerns regarding this report.

## 2016-08-05 ENCOUNTER — Ambulatory Visit (INDEPENDENT_AMBULATORY_CARE_PROVIDER_SITE_OTHER): Payer: Medicare HMO | Admitting: Psychology

## 2016-08-05 ENCOUNTER — Encounter: Payer: Self-pay | Admitting: Psychology

## 2016-08-05 DIAGNOSIS — G301 Alzheimer's disease with late onset: Secondary | ICD-10-CM | POA: Diagnosis not present

## 2016-08-05 DIAGNOSIS — F0280 Dementia in other diseases classified elsewhere without behavioral disturbance: Secondary | ICD-10-CM

## 2016-08-05 NOTE — Patient Instructions (Signed)
1. The patient is considered an appropriate candidate for cholinesterase inhibitor therapy. She should continue treatment with Aricept/Namenda. 2. The patient should continue to receive assistance with complex ADLs, including transportation, finances, medications, cooking and transportation. She is not driving.  3. The patient's family may wish to attend a local dementia caregiver support group and/or seek additional information from the Alzheimer's Association ([employer]).  4. The patient should continue to participate in activities which provide mental stimulation, safe cardiovascular exercise, and social interaction. Listening to music regularly is encouraged as this is something that she enjoys and is shown to improve mood/engagement in individuals with AD.  5. The patient's husband is most concerned about the patient's reduce interest in activities she used to enjoy, and she does demonstrate some depression and anxiety. She is already prescribed Wellbutrin and Buspirone. Consultation with a geriatric psychiatrist such as Dr. Norma Fredrickson may be helpful in determining if a change in medication needs to be made.   The brain games website I mentioned is www.lumosity.com

## 2016-08-17 DIAGNOSIS — H40053 Ocular hypertension, bilateral: Secondary | ICD-10-CM | POA: Diagnosis not present

## 2016-08-17 DIAGNOSIS — H401111 Primary open-angle glaucoma, right eye, mild stage: Secondary | ICD-10-CM | POA: Diagnosis not present

## 2016-08-17 DIAGNOSIS — H401122 Primary open-angle glaucoma, left eye, moderate stage: Secondary | ICD-10-CM | POA: Diagnosis not present

## 2016-09-09 DIAGNOSIS — R531 Weakness: Secondary | ICD-10-CM | POA: Diagnosis not present

## 2016-09-09 DIAGNOSIS — R42 Dizziness and giddiness: Secondary | ICD-10-CM | POA: Diagnosis not present

## 2016-09-09 DIAGNOSIS — R69 Illness, unspecified: Secondary | ICD-10-CM | POA: Diagnosis not present

## 2016-09-20 ENCOUNTER — Encounter: Payer: Self-pay | Admitting: Neurology

## 2016-09-20 ENCOUNTER — Ambulatory Visit (INDEPENDENT_AMBULATORY_CARE_PROVIDER_SITE_OTHER): Payer: Medicare HMO | Admitting: Neurology

## 2016-09-20 VITALS — BP 130/76 | HR 80 | Ht 64.0 in | Wt 127.5 lb

## 2016-09-20 DIAGNOSIS — R69 Illness, unspecified: Secondary | ICD-10-CM | POA: Diagnosis not present

## 2016-09-20 DIAGNOSIS — G301 Alzheimer's disease with late onset: Secondary | ICD-10-CM | POA: Diagnosis not present

## 2016-09-20 DIAGNOSIS — F0280 Dementia in other diseases classified elsewhere without behavioral disturbance: Secondary | ICD-10-CM | POA: Diagnosis not present

## 2016-09-20 MED ORDER — DONEPEZIL HCL 10 MG PO TABS: 10.0000 mg | ORAL_TABLET | Freq: Every day | ORAL | 3 refills | Status: DC

## 2016-09-20 NOTE — Progress Notes (Signed)
Subjective:    Cervantes ID: Shelly Cervantes is a 81 y.o. female.  HPI     Interim history:   Shelly Cervantes is a 81 year old right-handed woman with an underlying medical history of anxiety, depression, hyperlipidemia, hypertension, insomnia, low back pain, and glaucoma, who presents for follow-up consultation of her memory loss. Shelly Cervantes is accompanied by her husband and her daughter today. I saw her on 06/17/2016 for memory loss. At Shelly time, her MMSE was MMSE: 23/30, CDT: 4/4, AFT: 7/min. she was taking Namenda and Aricept and I suggested we switch her to [employer] for convenience. She was advised to be better hydrated with water and reduce and eliminate in fact her alcohol consumption. I referred her for cognitive testing.  She had neuropsychological testing on 07/26/2016 and a follow-up appointment with neuropsychology on 08/05/2016. I reviewed Dr. Si Raider Heath's neuropsychological report:   <<Impressions: Mild to moderate dementia most likely secondary to Alzheimer's disease. Results of Shelly current cognitive evaluation reveal several areas of impairment, including temporal orientation, verbal and non-verbal memory, semantic retrieval (i.e., semantic fluency and confrontation naming), mental flexibility and set-shifting, and abstract reasoning. Additionally, there is evidence that her cognitive deficits are interfering with her ability to manage complex tasks, such as managing finances, medications, driving and cooking. As such, diagnostic criteria for a dementia syndrome are met.    Shelly Cervantes's cognitive profile is suggestive of hippocampal consolidation dysfunction and medial-temporal lobe involvement. Alzheimer's disease is Shelly most likely etiology, given her cognitive profile clinical features and disease course. Due to Shelly level of impairment on testing and functional decline in daily life, she is considered in Shelly mild to moderate stage of dementia. She is experiencing increased depression and  anxiety which are considered secondary to her dementia as opposed to a primary psychiatric disorder.    Recommendations: Based on Shelly findings of Shelly present evaluation, Shelly following recommendations are offered:   1. Shelly Cervantes is considered an appropriate candidate for cholinesterase inhibitor therapy. She should continue treatment with Aricept/Namenda. 2. Shelly Cervantes should continue to receive assistance with complex ADLs, including transportation, finances, medications, cooking and transportation. She is not driving.  3. Shelly Cervantes's family may wish to attend a local dementia caregiver support group and/or seek additional information from [facility] [employer] ([email or web address]).  4. Shelly Cervantes should continue to participate in activities which provide mental stimulation, safe cardiovascular exercise, and social interaction. Listening to music regularly is encouraged as this is something that she enjoys and is shown to improve mood/engagement in individuals with AD.  5. Shelly Cervantes's husband is most concerned about Shelly Cervantes's reduce interest in activities she used to enjoy, and she does demonstrate some depression and anxiety. She is already prescribed Wellbutrin and Buspirone. Consultation with a geriatric psychiatrist such as Dr. Norma Fredrickson may be helpful in determining if a change in medication needs to be made. >>   Today, 09/20/2016 (all dictated new, as well as above notes, some dictation done in note pad or Word, outside of chart, may appear as copied):  She reports doing okay, no specific complaints, unfortunately, her insurance did not pay for Shelly Namzaric. She is still on donepezil 5 mg in Shelly evening and Namenda generic 10 mg twice a day. Of note, according to my note from March 2018 she may have tried a higher dose of donepezil but had GI side effects. Today, her husband states, she was never on 10 mg. She is willing to try it. She no longer drives.  She is not physically very  active, sometimes walks Shelly dog. She tends to sleep more and takes a midmorning nap and also an afternoon nap per husband. She's not exercising on a regular basis. He would be willing to look into Silver sneakers again. She has had some nonspecific dizziness, no vertigo, no falls thankfully. She has lightheadedness sometimes when she first stands. She has no joint pain thankfully, no headaches. She also c/o chronic sinus issues.    Shelly Cervantes's allergies, current medications, family history, past medical history, past social history, past surgical history and problem list were reviewed and updated as appropriate.   Previously (copied from previous notes for reference):   I met her in February 2014, at which time her MMSE was 27 out of 30, clock drawing was 4 out of 4. Of note, she did not show for a follow-up appointment which was scheduled for April 2014. She had a brain MRI without contrast on 06/26/2012 which showed mild perisylvian, subcortical mesial temporal atrophy, mild chronic small vessel ischemic disease.  She was started on donepezil in November 2015, has been on 5 mg once daily in Shelly evening. She may have tried a higher dose per husband, but he reports that she had GI side effects. She has been on Namenda generic 10 mg once daily in Shelly morning since September 2017. Unclear if this was escalated to 10 mg twice daily but she may have had side effects on a higher dose per husband. She was started on BuSpar in September 2017 as well for anxiety. She has been on Wellbutrin long-acting for over one year for her depression. Mood wise, she has been doing well, feels stable, per husband, she has done quite well with respect to depression and anxiety in Shelly past 6 months. She no longer drives, she has not driven a car and over 18 months per husband and daughter. She has 2 daughters, 3 grandchildren and one great-grandchild. Bedtime is early, around 7 PM or 8 PM, often more closer to 7. Wake up time is  around 6 AM. She does take a couple of naps during Shelly day. She does not always drink enough water and does not exercise regularly. She drinks coffee 1 or 2 cups per morning and some juice, maybe 1 glass per day and wine, 1 glass every other day or so. Her memory loss has worsened over time. She does not have a telltale history of dementia and her family, her older sister has memory loss, she is 48 years older. Per previous records: One brother died in his 43s from [place]. Mother lived to be 89 years old, father died of lung cancer at age 15. She is a nonsmoker.   Her Past Medical History Is Significant For: Past Medical History:  Diagnosis Date  . High cholesterol   . Hypertension     Her Past Surgical History Is Significant For: No past surgical history on file.  Her Family History Is Significant For: Family History  Problem Relation Age of Onset  . Cancer Mother   . Cancer Father   . Dementia Sister     Her Social History Is Significant For: Social History   Social History  . Marital status: Married    Spouse name: N/A  . Number of children: 12  . Years of education: 2   Occupational History  . Retired     Social History Main Topics  . Smoking status: Never Smoker  . Smokeless tobacco: Never Used  .  Alcohol use Yes     Comment: 1-2 glasses of wine 2x/week  . Drug use: No  . Sexual activity: Not Asked   Other Topics Concern  . None   Social History Narrative   Denies caffeine use     Her Allergies Are:  Allergies  Allergen Reactions  . Codeine   :   Her Current Medications Are:  Outpatient Encounter Prescriptions as of 09/20/2016  Medication Sig  . aspirin 81 MG tablet Take 81 mg by mouth daily.  Marland Kitchen buPROPion (WELLBUTRIN XL) 300 MG 24 hr tablet Take 300 mg by mouth every morning.  . busPIRone (BUSPAR) 15 MG tablet TAKE 1/2-1 TABLET TWICE A DAY ORALLY 30 DAYS  . donepezil (ARICEPT) 5 MG tablet Take 5 mg by mouth at bedtime.  Marland Kitchen lisinopril (PRINIVIL,ZESTRIL) 20 MG  tablet Take 20 mg by mouth daily.  . memantine (NAMENDA) 10 MG tablet TAKE 1/2-1 TABLET TWICE A DAY ORALLY 30 DAY(S)  . Memantine HCl-Donepezil HCl (NAMZARIC) 28-10 MG CP24 Take 1 capsule by mouth daily.  . traZODone (DESYREL) 50 MG tablet Take 50 mg by mouth at bedtime.   No facility-administered encounter medications on file as of 09/20/2016.   :  Review of Systems:  Out of a complete 14 point review of systems, all are reviewed and negative with Shelly exception of these symptoms as listed below:  Review of Systems  Neurological:       Pt presents today for follow up. Pt has some concern about dizziness. PCP referred to Dr Rexene Alberts and PCP would like to know if Dr Rexene Alberts would recommend ENT.    Objective:  Neurologic Exam  Physical Exam Physical Examination:   Vitals:   09/20/16 1447  BP: 130/76  Pulse: 80   General Examination: Shelly Cervantes is a very pleasant 81 y.o. female in no acute distress. She appears well-developed and well-nourished and well groomed.   HEENT: Normocephalic, atraumatic, pupils are equal, round and reactive to light and accommodation. Extraocular tracking is good without limitation to gaze excursion or nystagmus noted. Normal smooth pursuit is noted. Hearing is grossly intact. Face is symmetric with normal facial animation and normal facial sensation. Speech is clear with no dysarthria noted. There is no hypophonia. There is no lip, neck/head, jaw or voice tremor. Neck is supple with full range of passive and active motion. There are no carotid bruits on auscultation. Oropharynx exam reveals: mild mouth dryness, adequate dental hygiene and mild airway crowding, due to smaller airway entry and redundant soft palate. Mallampati is class II. Tongue protrudes centrally and palate elevates symmetrically.   Chest: Clear to auscultation without wheezing, rhonchi or crackles noted.  Heart: S1+S2+0, regular and normal without murmurs, rubs or gallops noted.   Abdomen:  Soft, non-tender and non-distended with normal bowel sounds appreciated on auscultation.  Extremities: There is no pitting edema in Shelly distal lower extremities bilaterally. Pedal pulses are intact.  Skin: Warm and dry without trophic changes noted.  Musculoskeletal: exam reveals no obvious joint deformities, tenderness or joint swelling or erythema.   Neurologically:  Mental status: Shelly Cervantes is awake, alert and oriented to place, self, circumstance. Her immediate and remote memory, attention, language skills and fund of knowledge are impaired. Speech is clear with normal prosody and enunciation. Thought process is linear. Mood is normal and affect is better today. She is less anxious appearing.  On 06/17/2016: MMSE: 23/30, CDT: 4/4, AFT: 7/min.   In Feb. 2014: MMSE was 27 out of 30,  clock drawing was 4 out of 4.  Cranial nerves II - XII are as described above under HEENT exam. In addition: shoulder shrug is normal with equal shoulder height noted. Motor exam: Normal bulk, strength for age and tone is noted. There is no drift, tremor or rebound. Romberg is positive. Reflexes are 1-2+ throughout. Fine motor skills and coordination: intact with normal finger taps, normal hand movements, normal rapid alternating patting, normal foot taps and normal foot agility.  Cerebellar testing: No dysmetria or intention tremor.  Sensory exam: intact to light touch in Shelly upper and lower extremities.  Gait, station and balance: She stands easily. No veering to one side is noted. No leaning to one side is noted. Posture is age-appropriate and stance is narrow based. Balance is mildly impaired, tandem walk is not tested for safety. She is wearing sandals with pointy heels.               Assessment and Plan:   In summary, Shelly Cervantes is a very pleasant 81 year old female with an underlying medical history of anxiety, depression, hyperlipidemia, hypertension, insomnia, low back pain, and glaucoma, who  presents for  Follow-up consultation of her memory loss. Her history, physical examination, memory scores and brain MRI testing are concerning for Alzheimer's disease,  Her recent neuropsychological  Test results in April 2018 were also supportive of Shelly diagnosis of Alzheimers disease in Shelly mild-to-moderate range. She also has a history of anxiety and depression which are under control on current medications. She has had some intermittent dizziness, but has a non-focal neurological exam. She may have some  Orthostatic lightheadedness or dizziness which could be side effects from her several neurotropic and psychotropic medications. She could not afford Namzaric 28-10 milligrams strength, and is  Currently on generic Namenda twice daily and donepezil 5 mg daily. We will try to increase this to 10 mg. She is advised to refrain from drinking alcohol altogether.  I had a long chat with Shelly Cervantes and her family about my findings and Shelly diagnosis of memory loss and dementia, its prognosis and treatment options. Implications of diagnosis explained at length with Shelly Cervantes and caregiver. We talked about medical treatments and non-pharmacological approaches. We talked about maintaining a healthy lifestyle in general and staying active mentally and physically. I stressed Shelly importance of regular exercise, within of course Shelly Cervantes's own mobility limitations. She is encouraged to sign up for Silver Sneakers. She is encouraged to play Shelly piano again. I increased her donepezil Rx to 10 mg daily. I suggested a 6 month follow-up, sooner as needed, I answered all Shelly questions today and Shelly Cervantes and her family were in agreement.  I spent 25 minutes in total face-to-face time with Shelly Cervantes, more than 50% of which was spent in counseling and coordination of care, reviewing test results, reviewing medication and discussing or reviewing Shelly diagnosis of dementia, its prognosis and treatment options. Pertinent  laboratory and imaging test results that were available during this visit with Shelly Cervantes were reviewed by me and considered in my medical decision making (see chart for details).

## 2016-09-20 NOTE — Patient Instructions (Addendum)
Unfortunately, dizziness is a very common complaint but is often not due to a primary neurological reason or single underlying medical problem. Often, there a combination of factors, that result in dizziness. This includes blood pressure fluctuations, medication side effects, blood sugar fluctuations, stress, vertigo, poor sleep with sleep deprivation, dehydration, and electrolyte disturbance or other metabolic and endocrinological reasons, meaning hormone related problems such as thyroid dysfunction.  Talk to Dr. Harrington Cervantes about seeing an ENT.   I will increase your donepezil to 10 mg each evening.  We will continue with generic Namenda 10 mg twice daily.   I think overall you are doing fairly well but I do want to suggest a few things today:  Play the piano again!  Remember to drink plenty of fluid, eat healthy meals and do not skip any meals. Try to eat protein with a every meal and eat a healthy snack such as fruit or nuts in between meals. Try to keep a regular sleep-wake schedule and try to exercise daily, particularly in the form of walking, 20-30 minutes a day, if you can. Good nutrition, proper sleep and exercise can help your memory and cognitive function.  Engage in social activities in your community and with your family and try to keep up with current events by reading the newspaper or watching the news. If you have computer and can go online, try [email or web address]. Also, you may like to do word finding puzzles or crossword puzzles.  I would like to see you back in 6 months, sooner if we need to. Please call us with any interim questions, concerns, problems, updates or refill requests.  Shelly Cervantes is my nurse and will answer any of your questions and relay your messages to me and also relay my messages to you.  Our phone number is [phone]. We also have an after hours call service for urgent matters and there is a physician on-call for urgent questions. For any emergencies you know to call 911  or go to the nearest emergency room.

## 2016-09-30 DIAGNOSIS — G479 Sleep disorder, unspecified: Secondary | ICD-10-CM | POA: Diagnosis not present

## 2016-09-30 DIAGNOSIS — I10 Essential (primary) hypertension: Secondary | ICD-10-CM | POA: Diagnosis not present

## 2016-09-30 DIAGNOSIS — Z0000 Encounter for general adult medical examination without abnormal findings: Secondary | ICD-10-CM | POA: Diagnosis not present

## 2016-09-30 DIAGNOSIS — R69 Illness, unspecified: Secondary | ICD-10-CM | POA: Diagnosis not present

## 2016-09-30 DIAGNOSIS — N6489 Other specified disorders of breast: Secondary | ICD-10-CM | POA: Diagnosis not present

## 2016-09-30 DIAGNOSIS — E7800 Pure hypercholesterolemia, unspecified: Secondary | ICD-10-CM | POA: Diagnosis not present

## 2016-10-04 DIAGNOSIS — G479 Sleep disorder, unspecified: Secondary | ICD-10-CM | POA: Diagnosis not present

## 2016-10-04 DIAGNOSIS — Z0000 Encounter for general adult medical examination without abnormal findings: Secondary | ICD-10-CM | POA: Diagnosis not present

## 2016-10-04 DIAGNOSIS — N6489 Other specified disorders of breast: Secondary | ICD-10-CM | POA: Diagnosis not present

## 2016-10-04 DIAGNOSIS — I10 Essential (primary) hypertension: Secondary | ICD-10-CM | POA: Diagnosis not present

## 2016-10-04 DIAGNOSIS — E7800 Pure hypercholesterolemia, unspecified: Secondary | ICD-10-CM | POA: Diagnosis not present

## 2016-10-04 DIAGNOSIS — R69 Illness, unspecified: Secondary | ICD-10-CM | POA: Diagnosis not present

## 2016-10-08 DIAGNOSIS — N6489 Other specified disorders of breast: Secondary | ICD-10-CM | POA: Diagnosis not present

## 2016-10-08 DIAGNOSIS — N6321 Unspecified lump in the left breast, upper outer quadrant: Secondary | ICD-10-CM | POA: Diagnosis not present

## 2016-10-11 ENCOUNTER — Other Ambulatory Visit: Payer: Self-pay | Admitting: Radiology

## 2016-10-11 DIAGNOSIS — Z0000 Encounter for general adult medical examination without abnormal findings: Secondary | ICD-10-CM | POA: Diagnosis not present

## 2016-10-11 DIAGNOSIS — C50412 Malignant neoplasm of upper-outer quadrant of left female breast: Secondary | ICD-10-CM | POA: Diagnosis not present

## 2016-10-14 ENCOUNTER — Telehealth: Payer: Self-pay | Admitting: *Deleted

## 2016-10-14 DIAGNOSIS — C50412 Malignant neoplasm of upper-outer quadrant of left female breast: Secondary | ICD-10-CM

## 2016-10-14 DIAGNOSIS — Z170 Estrogen receptor positive status [ER+]: Principal | ICD-10-CM | POA: Insufficient documentation

## 2016-10-14 NOTE — Telephone Encounter (Signed)
Confirmed [facility] for 10/20/16 at 1215pm .  Instructions and contact information given.

## 2016-10-20 ENCOUNTER — Ambulatory Visit
Admission: RE | Admit: 2016-10-20 | Discharge: 2016-10-20 | Disposition: A | Discharge status: Home / Self Care | Payer: Medicare HMO | Source: Ambulatory Visit | Attending: Radiation Oncology | Admitting: Radiation Oncology

## 2016-10-20 ENCOUNTER — Encounter: Payer: Self-pay | Admitting: Physical Therapy

## 2016-10-20 ENCOUNTER — Ambulatory Visit: Payer: Medicare HMO | Attending: General Surgery | Admitting: Physical Therapy

## 2016-10-20 ENCOUNTER — Other Ambulatory Visit: Payer: Self-pay | Admitting: *Deleted

## 2016-10-20 ENCOUNTER — Ambulatory Visit (HOSPITAL_BASED_OUTPATIENT_CLINIC_OR_DEPARTMENT_OTHER): Payer: Medicare HMO | Admitting: Oncology

## 2016-10-20 ENCOUNTER — Other Ambulatory Visit (HOSPITAL_BASED_OUTPATIENT_CLINIC_OR_DEPARTMENT_OTHER): Payer: Medicare HMO

## 2016-10-20 ENCOUNTER — Encounter: Payer: Self-pay | Admitting: Oncology

## 2016-10-20 VITALS — BP 112/53 | HR 74 | Temp 97.6°F | Resp 17 | Ht 64.0 in | Wt 128.7 lb

## 2016-10-20 DIAGNOSIS — I10 Essential (primary) hypertension: Secondary | ICD-10-CM | POA: Insufficient documentation

## 2016-10-20 DIAGNOSIS — F0280 Dementia in other diseases classified elsewhere without behavioral disturbance: Secondary | ICD-10-CM | POA: Insufficient documentation

## 2016-10-20 DIAGNOSIS — C50412 Malignant neoplasm of upper-outer quadrant of left female breast: Secondary | ICD-10-CM

## 2016-10-20 DIAGNOSIS — R2681 Unsteadiness on feet: Secondary | ICD-10-CM | POA: Diagnosis present

## 2016-10-20 DIAGNOSIS — Z170 Estrogen receptor positive status [ER+]: Secondary | ICD-10-CM | POA: Insufficient documentation

## 2016-10-20 DIAGNOSIS — Z801 Family history of malignant neoplasm of trachea, bronchus and lung: Secondary | ICD-10-CM | POA: Insufficient documentation

## 2016-10-20 DIAGNOSIS — Z7982 Long term (current) use of aspirin: Secondary | ICD-10-CM | POA: Insufficient documentation

## 2016-10-20 DIAGNOSIS — F419 Anxiety disorder, unspecified: Secondary | ICD-10-CM | POA: Insufficient documentation

## 2016-10-20 DIAGNOSIS — R293 Abnormal posture: Secondary | ICD-10-CM | POA: Diagnosis present

## 2016-10-20 DIAGNOSIS — M6281 Muscle weakness (generalized): Secondary | ICD-10-CM | POA: Diagnosis present

## 2016-10-20 DIAGNOSIS — C50912 Malignant neoplasm of unspecified site of left female breast: Secondary | ICD-10-CM | POA: Diagnosis not present

## 2016-10-20 DIAGNOSIS — F329 Major depressive disorder, single episode, unspecified: Secondary | ICD-10-CM | POA: Insufficient documentation

## 2016-10-20 DIAGNOSIS — R262 Difficulty in walking, not elsewhere classified: Secondary | ICD-10-CM

## 2016-10-20 DIAGNOSIS — G309 Alzheimer's disease, unspecified: Secondary | ICD-10-CM | POA: Insufficient documentation

## 2016-10-20 DIAGNOSIS — E7800 Pure hypercholesterolemia, unspecified: Secondary | ICD-10-CM | POA: Insufficient documentation

## 2016-10-20 LAB — COMPREHENSIVE METABOLIC PANEL
ALT: 23 U/L (ref 0–55)
ANION GAP: 11 meq/L (ref 3–11)
AST: 23 U/L (ref 5–34)
Albumin: 4 g/dL (ref 3.5–5.0)
Alkaline Phosphatase: 67 U/L (ref 40–150)
BUN: 16.6 mg/dL (ref 7.0–26.0)
CHLORIDE: 102 meq/L (ref 98–109)
CO2: 28 meq/L (ref 22–29)
Calcium: 10.2 mg/dL (ref 8.4–10.4)
Creatinine: 1.2 mg/dL — ABNORMAL HIGH (ref 0.6–1.1)
EGFR: 43 mL/min/{1.73_m2} — AB (ref >90)
Glucose: 89 mg/dl (ref 70–140)
POTASSIUM: 4 meq/L (ref 3.5–5.1)
Sodium: 140 mEq/L (ref 136–145)
Total Bilirubin: 0.42 mg/dL (ref 0.20–1.20)
Total Protein: 7.2 g/dL (ref 6.4–8.3)

## 2016-10-20 LAB — CBC WITH DIFFERENTIAL/PLATELET
BASO%: 0.9 % (ref 0.0–2.0)
Basophils Absolute: 0 10*3/uL (ref 0.0–0.1)
EOS%: 1.5 % (ref 0.0–7.0)
Eosinophils Absolute: 0.1 10*3/uL (ref 0.0–0.5)
HCT: 40.3 % (ref 34.8–46.6)
HGB: 13.3 g/dL (ref 11.6–15.9)
LYMPH%: 35.2 % (ref 14.0–49.7)
MCH: 30.8 pg (ref 25.1–34.0)
MCHC: 33.1 g/dL (ref 31.5–36.0)
MCV: 93.2 fL (ref 79.5–101.0)
MONO#: 0.5 10*3/uL (ref 0.1–0.9)
MONO%: 11.3 % (ref 0.0–14.0)
NEUT#: 2.1 10*3/uL (ref 1.5–6.5)
NEUT%: 51.1 % (ref 38.4–76.8)
PLATELETS: 173 10*3/uL (ref 145–400)
RBC: 4.32 10*6/uL (ref 3.70–5.45)
RDW: 13 % (ref 11.2–14.5)
WBC: 4.2 10*3/uL (ref 3.9–10.3)
lymph#: 1.5 10*3/uL (ref 0.9–3.3)

## 2016-10-20 MED ORDER — ANASTROZOLE 1 MG PO TABS: 1.0000 mg | ORAL_TABLET | Freq: Every day | ORAL | 4 refills | Status: DC

## 2016-10-20 NOTE — Patient Instructions (Signed)

## 2016-10-20 NOTE — Progress Notes (Signed)
Nutrition Assessment   Reason for Assessment:   Patient seen in breast clinic  ASSESSMENT:  81 year old female with new diagnosis of left breast cancer.  Past medical history of dementia, depression, HTN, high cholesterol  Patient's daughter and husband present during visit.  Husband prepares meals and reports she eats better when I prepare the meals but I am new at cooking.  Family reports appetite has been declining for the last 2 years. Patient has tried oral nutrition supplements but does not really like them.  Nutrition Focused Physical Exam: deferred  Medications: reviewed  Labs: reviewed  Anthropometrics:   Height: 64 inches Weight: 128 bl 11.2 oz BMI: 22  Family reports weight loss over the last 2 years but not sure how much.    Estimated Energy Needs  Kcals: 1700 calories/d Protein: 70-87 g/d Fluid: 1.7 L/d  NUTRITION DIAGNOSIS: Inadequate oral intake related to dementia as evidenced by decreased intake and weight loss per family   MALNUTRITION DIAGNOSIS: not enough information to determine at this time   INTERVENTION:   Encouraged family to liberalize diet.  Discussed ways of increasing calories and protein.  Fact sheet given.   Encouraged family to try several different oral nutrition supplements. Encouraged the high calorie, high protein options.  Discussed ways to modify taste and calorie amount of oral nutrition supplements. Patient may benefit from appetite stimulant if intake is poor and weight loss continues   Contact information provided and encouraged family to contact me with questions    MONITORING, EVALUATION, GOAL: Patient will consume adequate calories and protein to maintain weight   NEXT VISIT: as needed  Ayeza Therriault B. Zenia Resides, [place], [place] Registered Dietitian [phone] (pager)

## 2016-10-20 NOTE — Progress Notes (Signed)
Radiation Oncology         [phone] ________________________________  Initial Outpatient Consultation  Name: Shelly Cervantes MRN: [id number]  Date: 10/20/2016  DOB: March 30, 1936  [id number], Dwyane Luo, MD  Excell Seltzer, MD   REFERRING PHYSICIAN: Excell Seltzer, MD  DIAGNOSIS: The encounter diagnosis was Malignant neoplasm of upper-outer quadrant of left breast in female, estrogen receptor positive ([place]).  Stage T2 Nx Mx Left Breast UOQ Invasive Lobular Mammary Carcinoma, ER Positive, PR Negative, Grade II  HISTORY OF PRESENT ILLNESS::Shelly Cervantes is a 81 y.o. female who initially presented with physician palpated asymmetry. Mammogram on 10/08/16 showed a new 3 cm irregular asymmetry in the left breast. US showed a 2.5 cm mass at the 12:30 o'clock position, and axilla was negative.  Biopsy of the left breast on 10/11/16 revealed invasive mammary carcinoma, ER positive, PR negative, grade II.   The patient presents to the multidisciplinary clinic today to discuss the role that radiation therapy may play in the treatment of her disease.  On review of systems, the patient is positive for glasses, sinus problems, dentures, breast lump, anxiety, and depression.  Gynecologic History: Age at first menstrual period? 18 Are you still having periods? No  Approximate date of last period? 2000 If you no longer have periods: Have you used hormone replacement? No Obstetric History:  How many children have you carried to term? 2  Your age at first live birth? 71 Pregnant now or trying to get pregnant? No Have you used birth control pills or hormone shots for contraception? Yes If so, for how long (or approximate dates)? [id number] Health Maintenance: Have you ever had a colonoscopy? Yes If yes, date? 2007 Have you ever had a bone density? No Date of your last PAP smear? 1996  Date of your FIRST mammogram? 1965  PREVIOUS RADIATION THERAPY: No  PAST MEDICAL HISTORY:  has a past  medical history of Anxiety; Depression; High cholesterol; and Hypertension.    PAST SURGICAL HISTORY:No past surgical history on file.  FAMILY HISTORY: family history includes Cancer in her father and mother; Dementia in her sister; Lung cancer in her father.  SOCIAL HISTORY:  reports that she has never smoked. She has never used smokeless tobacco. She reports that she drinks about 1.8 oz of alcohol per week . She reports that she does not use drugs. The patient is retired. She lives with her husband.  ALLERGIES: Codeine  MEDICATIONS:  Current Outpatient Prescriptions  Medication Sig Dispense Refill  . anastrozole (ARIMIDEX) 1 MG tablet Take 1 tablet (1 mg total) by mouth daily. 90 tablet 4  . aspirin 81 MG tablet Take 81 mg by mouth daily.    Marland Kitchen buPROPion (WELLBUTRIN XL) 300 MG 24 hr tablet Take 300 mg by mouth every morning.  12  . busPIRone (BUSPAR) 15 MG tablet daily  7  . donepezil (ARICEPT) 10 MG tablet Take 1 tablet (10 mg total) by mouth at bedtime. 90 tablet 3  . lisinopril (PRINIVIL,ZESTRIL) 20 MG tablet Take 20 mg by mouth daily.  12  . memantine (NAMENDA) 10 MG tablet two times daily  7  . Multiple Vitamin (MULTI-VITAMIN DAILY PO) Take by mouth.    . traZODone (DESYREL) 50 MG tablet Take 50 mg by mouth at bedtime.  12   No current facility-administered medications for this encounter.     REVIEW OF SYSTEMS:  A 10+ POINT REVIEW OF SYSTEMS WAS OBTAINED including neurology, dermatology, psychiatry, cardiac, respiratory, lymph, extremities, GI, GU, musculoskeletal,  constitutional, reproductive, HEENT. All pertinent positives are noted in the HPI. All others are negative.   PHYSICAL EXAM:  eight 5\' 4"   Weight 128 lbs 11 oz  BMI 75.6  Systolic 433  Diastolic 53  Pulse 74  Respirations 17  General: Alert, very pleasant. Accompanied by husband and 2 daughters  HEENT: Head is normocephalic.  Neck: Neck is supple, no palpable cervical or supraclavicular lymphadenopathy. Heart:  Regular in rate and rhythm with no murmurs. Chest: Clear to auscultation bilaterally. Musculoskeletal: symmetric strength and muscle tone throughout. Neurologic:  No obvious focalities. Speech is fluent. Coordination is intact. Breast: Right breast shows no palpable mass or nipple discharge. Left breast shows a large area of thickening in the upper-outer quadrant spanning 4 cm x 5 cm, concerning for a breast mass. No nipple discharge or bleeding.   ECOG = 3   LABORATORY DATA:  Lab Results  Component Value Date   WBC 4.2 10/20/2016   HGB 13.3 10/20/2016   HCT 40.3 10/20/2016   MCV 93.2 10/20/2016   PLT 173 10/20/2016   NEUTROABS 2.1 10/20/2016   Lab Results  Component Value Date   NA 140 10/20/2016   K 4.0 10/20/2016   CO2 28 10/20/2016   GLUCOSE 89 10/20/2016   CREATININE 1.2 (H) 10/20/2016   CALCIUM 10.2 10/20/2016      RADIOGRAPHY: No results found.    IMPRESSION: Shelly Cervantes is a 81 y.o. woman with left breast invasive lobular carcinoma. Given the patient's progressive alzheimer's disease, it is doubtful she would benefit from radiation therapy and her family express concerns that she would be too fatigued. She may not be able to have a mastectomy given her situation, and thus hormone therapy alone may be the best approach for her.  PLAN: The patient may be a candidate for mastectomy, however based on her progressive alzheimer's she and her family may elect for only hormone therapy. MRI likely to be ordered with lobular histology and further evaluation of breast mass size.      ------------------------------------------------  Blair Promise, PhD, MD  This document serves as a record of services personally performed by Gery Pray, MD. It was created on his behalf by Maryla Morrow, a trained medical scribe. The creation of this record is based on the scribe's personal observations and the provider's statements to them. This document has been checked and approved by  the attending provider.

## 2016-10-20 NOTE — Progress Notes (Signed)
[place]  Telephone:([phone]) [phone] Fax:(336) [phone]     ID: Shelly Cervantes DOB: 25-Dec-1935  MR#: [id number]  LJQ#:[id number]  Patient Care Team: Shelly Cruel, MD as PCP - General (Family Medicine) Shelly Seltzer, MD as Consulting Physician (General Surgery) Shelly Cervantes, Shelly Dad, MD as Consulting Physician (Oncology) Shelly Pray, MD as Consulting Physician (Radiation Oncology) Shelly Cruel, MD OTHER MD:  CHIEF COMPLAINT: Estrogen receptor positive breast cancer  CURRENT TREATMENT: Definitive surgery pending   BREAST CANCER HISTORY: The patient's primary care physician Dr. Harrington Cervantes noted a change in the patient's left breast upper outer quadrant and set her up for bilateral diagnostic mammography with tomography and left breast ultrasonography at [facility] 10/08/2016. The breast density was category B. In the left breast upper outer quadrant there was a new 3 cm area of asymmetry with architectural distortion. Ultrasound measured this at 2.5 cm. There were no abnormalities in the left axilla by sonography.  On 10/11/2016 the patient underwent core biopsy of the left breast mass in question, and this showed ([id number]) invasive lobular carcinoma, E-cadherin negative, grade 2, estrogen receptor 90% positive with strong staining intensity but progesterone receptor negative. MIB-1 was 10% and there was insufficient invasive tumor for HER-2 evaluation.  The patient's subsequent history is as detailed below.  INTERVAL HISTORY: Shelly Cervantes was evaluated in the multidisciplinary breast cancer clinic 10/19/2016 accompanied by her husband Shelly Cervantes and her daughters Shelly Cervantes and Shelly Cervantes. Her case was also presented in the multidisciplinary breast cancer conference that same morning. At that time a preliminary plan was proposed: Definitive surgery with sentinel lymph node sampling, most likely no chemotherapy, most likely adjuvant radiation, depending on final pathology  REVIEW OF  SYSTEMS: Aside from the change noted in the breast, there were no specific symptoms leading to the original mammogram, which was routinely scheduled. The patient denies unusual headaches, visual changes, nausea, vomiting, stiff neck, dizziness, or gait imbalance. There has been no cough, phlegm production, or pleurisy, no chest pain or pressure, and no change in bowel or bladder habits. The patient denies fever, rash, bleeding, unexplained fatigue or unexplained weight loss. The patient does admit to some anxiety and depression. A detailed review of systems was otherwise entirely negative.   PAST MEDICAL HISTORY: Past Medical History:  Diagnosis Date  . Anxiety   . Depression   . High cholesterol   . Hypertension     PAST SURGICAL HISTORY: History reviewed. No pertinent surgical history.  FAMILY HISTORY Family History  Problem Relation Age of Onset  . Cancer Mother   . Cancer Father   . Lung cancer Father   . Dementia Sister   The patient's father died at age 60 from lung cancer. The patient's mother died at age 61. The patient had 2 brothers, 1 sister. There is no history of breast or ovarian cancer in the family.  GYNECOLOGIC HISTORY:  No LMP recorded. Menarche age 89, first live birth age 23, she is [place] P2. She stopped having periods around June 2000. She never took hormone replacement. She used oral contraceptives for approximately 9 years remotely with no complications.  SOCIAL HISTORY:  She is retired. She has 2 children from her first marriage, Shelly Cervantes who is a Marine scientist working as a Occupational hygienist at [employer], and Shelly Cervantes, who also lives in [place] and works in Engineer, technical sales for [employer]. The patient has 3 biologic grandchildren and one great-grandchild. Her husband Shelly Cervantes has 2 children from his first marriage, a daughter in  [place] and his son in [place]. The patient attends a friend's church    ADVANCED DIRECTIVES: In place HEALTH MAINTENANCE: Social  History  Substance Use Topics  . Smoking status: Never Smoker  . Smokeless tobacco: Never Used  . Alcohol use 1.8 oz/week    3 Glasses of wine per week     Comment: 1-2 glasses of wine 2x/week     Colonoscopy:2007  PAP:  Bone density: Never   Allergies  Allergen Reactions  . Codeine     Current Outpatient Prescriptions  Medication Sig Dispense Refill  . aspirin 81 MG tablet Take 81 mg by mouth daily.    Marland Kitchen buPROPion (WELLBUTRIN XL) 300 MG 24 hr tablet Take 300 mg by mouth every morning.  12  . busPIRone (BUSPAR) 15 MG tablet daily  7  . donepezil (ARICEPT) 10 MG tablet Take 1 tablet (10 mg total) by mouth at bedtime. 90 tablet 3  . lisinopril (PRINIVIL,ZESTRIL) 20 MG tablet Take 20 mg by mouth daily.  12  . memantine (NAMENDA) 10 MG tablet two times daily  7  . Multiple Vitamin (MULTI-VITAMIN DAILY PO) Take by mouth.    . traZODone (DESYREL) 50 MG tablet Take 50 mg by mouth at bedtime.  12   No current facility-administered medications for this visit.     OBJECTIVE:Elderly white woman who appears well  Vitals:   10/20/16 1322  BP: (!) 112/53  Pulse: 74  Resp: 17  Temp: 97.6 F (36.4 C)     Body mass index is 22.09 kg/m.  @.WEIGHTLAST3@    ECOG FS:0 - Asymptomatic  Ocular: Sclerae unicteric, pupils round and equal Ear-nose-throat: Oropharynx clear and moist Lymphatic: No cervical or supraclavicular adenopathy Lungs no rales or rhonchi Heart regular rate and rhythm Abd soft, nontender, positive bowel sounds MSK no focal spinal tenderness, no joint edema Neuro: non-focal, oriented to place, did not know the name of the president or the year. Pleasant affect Breasts: The right breast is benign. The left breast is status post recent biopsy. Changes are subtle and her primary care physician deserves credit for picking up on this There is no overlying erythema or nipple change. Both axillae are benign.   LAB RESULTS:  CMP     Component Value Date/Time   NA 140  10/20/2016 1309   K 4.0 10/20/2016 1309   CO2 28 10/20/2016 1309   GLUCOSE 89 10/20/2016 1309   BUN 16.6 10/20/2016 1309   CREATININE 1.2 (H) 10/20/2016 1309   CALCIUM 10.2 10/20/2016 1309   PROT 7.2 10/20/2016 1309   ALBUMIN 4.0 10/20/2016 1309   AST 23 10/20/2016 1309   ALT 23 10/20/2016 1309   ALKPHOS 67 10/20/2016 1309   BILITOT 0.42 10/20/2016 1309    No results found for: TOTALPROTELP, ALBUMINELP, A1GS, A2GS, BETS, BETA2SER, GAMS, MSPIKE, SPEI  No results found for: Nils Pyle, [facility]  Lab Results  Component Value Date   WBC 4.2 10/20/2016   NEUTROABS 2.1 10/20/2016   HGB 13.3 10/20/2016   HCT 40.3 10/20/2016   MCV 93.2 10/20/2016   PLT 173 10/20/2016      Chemistry      Component Value Date/Time   NA 140 10/20/2016 1309   K 4.0 10/20/2016 1309   CO2 28 10/20/2016 1309   BUN 16.6 10/20/2016 1309   CREATININE 1.2 (H) 10/20/2016 1309      Component Value Date/Time   CALCIUM 10.2 10/20/2016 1309   ALKPHOS 67 10/20/2016 1309  AST 23 10/20/2016 1309   ALT 23 10/20/2016 1309   BILITOT 0.42 10/20/2016 1309       No results found for: LABCA2  No components found for: [id number]  No results for input(s): INR in the last 168 hours.  Urinalysis No results found for: COLORURINE, APPEARANCEUR, LABSPEC, PHURINE, GLUCOSEU, HGBUR, BILIRUBINUR, KETONESUR, PROTEINUR, UROBILINOGEN, NITRITE, LEUKOCYTESUR   STUDIES: Outside studies discussed with the patient  ELIGIBLE FOR AVAILABLE RESEARCH PROTOCOL: no  ASSESSMENT: 82 y.o. [place], [place] woman  (1) anastrozole started 10/20/2016  (2) definitive surgery to follow  (3) adjuvant radiation as appropriate  PLAN: We spent the better part of today's hour-long appointment discussing the biology of breast cancer in general, and the specifics of the patient's tumor in particular. We first reviewed the fact that cancer is not one disease but more than 100 different diseases and that it is important to  keep them separate-- otherwise when friends and relatives discuss their own cancer experiences with [facility] confusion can result. Similarly we explained that if breast cancer spreads to the bone or liver, the patient would not have bone cancer or liver cancer, but breast cancer in the bone and breast cancer in the liver: one cancer in three places-- not 3 different cancers which otherwise would have to be treated in 3 different ways.  We discussed the difference between local and systemic therapy. In terms of loco-regional treatment, lumpectomy plus radiation is equivalent to mastectomy as far as survival is concerned. For this reason, and because the cosmetic results are generally superior, we recommend breast conserving surgery. We also noted that in terms of sequencing of treatments, whether systemic therapy or surgery is done first does not affect the ultimate outcome.  We then discussed the rationale for systemic therapy. There is some risk that this cancer may have already spread to other parts of her body. Patients frequently ask at this point about bone scans, CAT scans and PET scans to find out if they have occult breast cancer somewhere else. The problem is that in early stage disease we are much more likely to find false positives then true cancers and this would expose the patient to unnecessary procedures as well as unnecessary radiation. Scans cannot answer the question the patient really would like to know, which is whether she has microscopic disease elsewhere in her body. For those reasons we do not recommend them.  Of course we would proceed to aggressive evaluation of any symptoms that might suggest metastatic disease, but that is not the case here.  Next we went over the options for systemic therapy which are anti-estrogens, anti-HER-2 immunotherapy, and chemotherapy. Jynesis does not meet criteria for anti-HER-2 immunotherapy. She is a good candidate for anti-estrogens.  The question of  chemotherapy is more complicated. Chemotherapy is most effective in rapidly growing, aggressive tumors. It is much less effective in not high-grade, slow growing cancers, like Ammara 's. Since the expected contribution of chemotherapy in this case is expected to be marginal if any, given the patient's age and comorbidity we will bypass Oncotype testing and proceed to anti-estrogens as the only systemic therapy.  In that regard we discussed anastrozole in detail. She has a good understanding of the possible toxicities, side effects and Occasions. I went ahead and placed the prescription for her to start that now so if there are any surgical delays or other complications before definitive surgery she is already under treatment. We will also give [place] information on her tolerance of this  agent which may be relevant to the decision regarding radiation.  The overall plan then is to start with surgery, consider radiation depending on final surgical results, and continue anastrozole or minimum of 5 years.  Yomayra has a good understanding of the overall plan. She agrees with it. She knows the goal of treatment in her case is cure. She will call with any problems that may develop before her next visit here.  Shelly Cruel, MD   10/20/2016 3:01 PM Medical Oncology and Hematology [facility] [address] [place], [place] [address] Tel. [phone]    Fax. [phone]

## 2016-10-20 NOTE — Therapy (Signed)
[place], [place], [address] Phone: [phone]   Fax:  [phone]  Physical Therapy Evaluation  Patient Details  Name: Shelly Cervantes MRN: [id number] Date of Birth: 23-Apr-1935 Referring Provider: Dr. Excell Seltzer  Encounter Date: 10/20/2016      PT End of Session - 10/20/16 2217    Visit Number 1   Number of Visits 1   PT Start Time 1330   PT Stop Time 8315  Also saw pt from 1420-1425 for a total of 29 minutes   PT Time Calculation (min) 24 min   Activity Tolerance Patient tolerated treatment well   Behavior During Therapy [facility] for tasks assessed/performed      Past Medical History:  Diagnosis Date  . Anxiety   . Depression   . High cholesterol   . Hypertension     History reviewed. No pertinent surgical history.  There were no vitals filed for this visit.       Subjective Assessment - 10/20/16 2206    Subjective Patient's family reports she is here today to be seen by her medical team for her newly diagnosed left breast cancer. They also report the patient has been diagnosed with Alzheimer's dementia which they report is fairly severe. Due to her cognition, they report she has declined physically and is unmotivated to be active.   Patient is accompained by: Family member  Husband and daughters (one is a Marine scientist)   Pertinent History Patient was diagnosed on 10/08/16 with left grade 2 invasive lobular carcinoma breast cancer. It is located in the upper outer quadrant and measures 2.5 cm. It is ER positive, PR negative, and there was insufficient tissue to determine HER2 status on biopsy. Ki67 is 10%. She is currently under treatment for Alzheimer's but the patient and family report she is otherwise independent and living at home with her husband.   Patient Stated Goals Reduce lymphedema risk, reduce fall risk, improve strength and function before surgery   Currently in Pain? No/denies             [facility] PT Assessment - 10/20/16 0001      Assessment   Medical Diagnosis Left breast cancer, fall risk, deconditioned   Referring Provider Dr. Excell Seltzer   Onset Date/Surgical Date 10/08/16   Hand Dominance Right   Prior Therapy none     Precautions   Precautions Other (comment);Fall   Precaution Comments Active cancer; cognitive deficits     Restrictions   Weight Bearing Restrictions No     Balance Screen   Has the patient fallen in the past 6 months No   Has the patient had a decrease in activity level because of a fear of falling?  No   Is the patient reluctant to leave their home because of a fear of falling?  No     Home Ecologist residence   Living Arrangements Spouse/significant other   Available Help at Discharge Family     Prior Function   Level of Independence Independent with basic ADLs   Vocation Retired   Leisure She does not exercise     Cognition   Overall Cognitive Status History of cognitive impairments - at baseline  Due to Alzheimer's   Attention Divided   Divided Attention Impaired   Memory Impaired   Memory Impairment Other (comment)  Not fully assessed but unable to recall year     Posture/Postural Control   Posture/Postural Control Postural  limitations   Postural Limitations Forward head;Rounded Shoulders     ROM / Strength   AROM / PROM / Strength AROM     AROM   AROM Assessment Site Shoulder;Cervical   Right/Left Shoulder Right;Left   Right Shoulder Extension 47 Degrees   Right Shoulder Flexion 130 Degrees   Right Shoulder ABduction 163 Degrees   Right Shoulder Internal Rotation 85 Degrees   Right Shoulder External Rotation 85 Degrees   Left Shoulder Extension 45 Degrees   Left Shoulder Flexion 119 Degrees   Left Shoulder ABduction 142 Degrees   Left Shoulder Internal Rotation 56 Degrees   Left Shoulder External Rotation 80 Degrees   Cervical Flexion WNL   Cervical Extension WNL    Cervical - Right Side Bend WNL   Cervical - Left Side Bend WNL   Cervical - Right Rotation WNL   Cervical - Left Rotation WNL           LYMPHEDEMA/ONCOLOGY QUESTIONNAIRE - 10/20/16 2213      Type   Cancer Type Left breast cancer     Treatment   Current Hormone Treatment Yes   Date 10/20/16   Drug Name Arimidex     Lymphedema Assessments   Lymphedema Assessments Upper extremities     Right Upper Extremity Lymphedema   10 cm Proximal to Olecranon Process 24 cm   Olecranon Process 20.7 cm   10 cm Proximal to Ulnar Styloid Process 18.4 cm   Just Proximal to Ulnar Styloid Process 14 cm   Across Hand at [employer] 17.4 cm   At [place] of 2nd Digit 5.6 cm     Left Upper Extremity Lymphedema   10 cm Proximal to Olecranon Process 24 cm   Olecranon Process 20.8 cm   10 cm Proximal to Ulnar Styloid Process 18.1 cm   Just Proximal to Ulnar Styloid Process 13.9 cm   Across Hand at [employer] 16.8 cm   At [place] of 2nd Digit 5.5 cm         Objective measurements completed on examination: See above findings.       Patient was instructed today in a home exercise program today for post op shoulder range of motion. These included active assist shoulder flexion in sitting, scapular retraction, wall walking with shoulder abduction, and hands behind head external rotation.  She was encouraged to do these twice a day, holding 3 seconds and repeating 5 times when permitted by her physician.               PT Education - 10/20/16 2215    Education provided Yes   Education Details Lymphedema risk reduction and post op shoulder ROM HEP   Person(s) Educated Patient;Spouse;Child(ren)   Methods Explanation;Demonstration;Verbal cues;Handout   Comprehension Verbalized understanding;Returned demonstration;Verbal cues required              Breast Clinic Goals - 10/20/16 2226      Patient will be able to verbalize understanding of pertinent lymphedema risk  reduction practices relevant to her diagnosis specifically related to skin care.   Time 1   Period Days   Status Achieved     Patient will be able to return demonstrate and/or verbalize understanding of the post-op home exercise program related to regaining shoulder range of motion.   Time 1   Period Days   Status Achieved     Patient will be able to verbalize understanding of the importance of attending the postoperative After Breast Cancer Class  for further lymphedema risk reduction education and therapeutic exercise.   Time 1   Period Days   Status Achieved          Long Term Clinic Goals - 10/20/16 2227      CC Long Term Goal  #1   Title Patient will be independent with her home exercise program for functional strengthening with hre husband's assistance.   Time 4   Period Weeks   Status New     CC Long Term Goal  #2   Title Patient's family will report >/= 25% improvement in tolerance for doing tasks which require walking such as shopping.   Time 4   Period Weeks   Status New     CC Long Term Goal  #3   Title Improve BERG balance score by >/= 4 points to reduce fall risk.   Baseline BERG to be performed at next visit.   Time 4   Period Weeks   Status New     CC Long Term Goal  #4   Title Patient and/or husband will report she is able to stand for >/= 20 minutes without sitting to tolerate showering and dressing without rest.   Time 4   Period Weeks   Status New     CC Long Term Goal  #5   Title Patient and/or husband will report a good understanding of a walking program and that it has been implemented.   Time 4   Period Weeks   Status New             Plan - 10/20/16 2217    Clinical Impression Statement Patient was diagnosed on 10/08/16 with left grade 2 invasive lobular carcinoma breast cancer. It is located in the upper outer quadrant and measures 2.5 cm. It is ER positive, PR negative, and there was insufficient tissue to determine HER2 status on  biopsy. Ki67 is 10%. She is currently under treatment for Alzheimer's but the patient and family report she is otherwise independent and living at home with her husband. Her family has concerns about her decline in activity tolerance since Alzheimer's symptoms began and wonderes about PT to improve strength and walking. They also have concerns about falling. Her multidisciplinary medical team met prior to her assessments to determine a recommended treatment plan. She is planning to begin Arimidex today and have an MRI to determine extent of disease as this is a lobular cancer and there is an area of asymmetry that appears unclear. Depending on MRI results, she will likely undergo a left lumpectomy and sentinel node biopsy followed by radiation and anti-estrogen therapy. She will benefit from PT now to improve overall functional status, reduce fall risk, and reduce generalized weakness which may improve overall surgical outcomes. She will also benefit from post op PT to reduce lymphedema risk and regain shoulder ROM. Although she is reluctant about attending therapy, her family was insisting on this and after the potential benefits were explained, she agreed to attend.   History and Personal Factors relevant to plan of care: Alzheimer's; unknown extent of breast cancer disease   Clinical Presentation Evolving   Clinical Presentation due to: Unknown extent of breast cancer disease   Clinical Decision Making Moderate   Rehab Potential Good   Clinical Impairments Affecting Rehab Potential Alzheimer's   PT Frequency 2x / week   PT Duration 4 weeks   PT Treatment/Interventions ADLs/Self Care Home Management;Therapeutic activities;Therapeutic exercise;Balance training;Neuromuscular re-education;Patient/family education;Functional mobility training;Energy conservation   PT  Next Visit Plan BERG balance test; assess LE strength; begin HEP for functional strengthening   PT Home Exercise Plan Post op shoulder ROM HEP    Consulted and Agree with Plan of Care Patient;Family member/caregiver   Family Member Consulted Husband and 2 daughters      Patient will benefit from skilled therapeutic intervention in order to improve the following deficits and impairments:  Decreased activity tolerance, Decreased balance, Decreased cognition, Decreased mobility, Decreased knowledge of precautions, Decreased endurance, Decreased range of motion, Decreased strength, Postural dysfunction, Impaired UE functional use, Difficulty walking  Visit Diagnosis: Muscle weakness (generalized) - Plan: PT plan of care cert/re-cert  Carcinoma of upper-outer quadrant of left breast in female, estrogen receptor positive ([place]) - Plan: PT plan of care cert/re-cert  Abnormal posture - Plan: PT plan of care cert/re-cert  Difficulty in walking, not elsewhere classified - Plan: PT plan of care cert/re-cert  Patient will follow up at outpatient cancer rehab if needed following surgery.  If the patient requires physical therapy at that time, a specific plan will be dictated and sent to the referring physician for approval. The patient was educated today on appropriate basic range of motion exercises to begin post operatively and the importance of attending the After Breast Cancer class following surgery.  Patient was educated today on lymphedema risk reduction practices as it pertains to recommendations that will benefit the patient immediately following surgery.  She verbalized good understanding.        G-Codes - 11/11/16 Jun 15, 2227    Functional Assessment Tool Used (Outpatient Only) Clinical Judgement   Functional Limitation Mobility: Walking and moving around   Mobility: Walking and Moving Around Current Status [phone]) At least 40 percent but less than 60 percent impaired, limited or restricted   Mobility: Walking and Moving Around Goal Status [phone]) At least 1 percent but less than 20 percent impaired, limited or restricted       Problem  List Patient Active Problem List   Diagnosis Date Noted  . Malignant neoplasm of upper-outer quadrant of left breast in female, estrogen receptor positive ([place]) 10/14/2016   Annia Friendly, PT November 11, 2016 10:33 PM  [place], [place], [address] Phone: [phone]   Fax:  [phone]  Name: Shelly Cervantes MRN: [id number] Date of Birth: [birth year]

## 2016-10-21 NOTE — Progress Notes (Signed)
[place] Psychosocial Distress Screening Spiritual Care  Counselor met patient and her family in Breast Multidisciplinary Clinic to introduce [place] team/resources, reviewing distress screen per protocol.  The patient scored a 4 on the Psychosocial Distress Thermometer which indicates Mild distress. Also assessed for distress and other psychosocial needs.   Counselor reviewed information packet with pt and her family, informing them of the many opportunities that the [employer] offers. Pt's family seemed highly interested in participating in art and gardening classes.   Follow up needed: No.  Westly Pam, MS [facility] [place] [phone]  ONCBCN DISTRESS SCREENING 10/21/2016  Screening Type Initial Screening  Distress experienced in past week (1-10) 4  Emotional problem type Nervousness/Anxiety;Boredom  Referral to support programs Yes

## 2016-10-26 ENCOUNTER — Telehealth: Payer: Self-pay | Admitting: *Deleted

## 2016-10-26 NOTE — Telephone Encounter (Signed)
Spoke to pt regarding [place] from 7.11.18. Denies questions or concerns regarding dx or treatment care plan. Encourage pt to call with needs. Received verbal understanding

## 2016-10-27 ENCOUNTER — Other Ambulatory Visit: Payer: Medicare HMO

## 2016-10-28 ENCOUNTER — Other Ambulatory Visit: Payer: Self-pay

## 2016-10-28 DIAGNOSIS — Z170 Estrogen receptor positive status [ER+]: Principal | ICD-10-CM

## 2016-10-28 DIAGNOSIS — C50412 Malignant neoplasm of upper-outer quadrant of left female breast: Secondary | ICD-10-CM

## 2016-11-01 ENCOUNTER — Ambulatory Visit
Admission: RE | Admit: 2016-11-01 | Discharge: 2016-11-01 | Disposition: A | Discharge status: Home / Self Care | Payer: Medicare HMO | Source: Ambulatory Visit | Attending: Oncology | Admitting: Oncology

## 2016-11-01 DIAGNOSIS — Z170 Estrogen receptor positive status [ER+]: Principal | ICD-10-CM

## 2016-11-01 DIAGNOSIS — C50412 Malignant neoplasm of upper-outer quadrant of left female breast: Secondary | ICD-10-CM

## 2016-11-01 DIAGNOSIS — C50912 Malignant neoplasm of unspecified site of left female breast: Secondary | ICD-10-CM | POA: Diagnosis not present

## 2016-11-01 MED ORDER — GADOBENATE DIMEGLUMINE 529 MG/ML IV SOLN
6.0000 mL | Freq: Once | INTRAVENOUS | Status: AC | PRN
  Administered 2016-11-01: 6 mL via INTRAVENOUS

## 2016-11-02 ENCOUNTER — Encounter: Payer: Self-pay | Admitting: Physical Therapy

## 2016-11-03 ENCOUNTER — Ambulatory Visit: Payer: Self-pay | Admitting: General Surgery

## 2016-11-03 ENCOUNTER — Other Ambulatory Visit: Payer: Medicare HMO

## 2016-11-03 DIAGNOSIS — C50912 Malignant neoplasm of unspecified site of left female breast: Secondary | ICD-10-CM

## 2016-11-03 DIAGNOSIS — Z170 Estrogen receptor positive status [ER+]: Principal | ICD-10-CM

## 2016-11-03 NOTE — Addendum Note (Signed)
Addended by: Arbutus Ped C on: 11/03/2016 09:05 AM   Modules accepted: Orders

## 2016-11-04 ENCOUNTER — Ambulatory Visit: Payer: Medicare HMO | Admitting: Physical Therapy

## 2016-11-04 DIAGNOSIS — M6281 Muscle weakness (generalized): Secondary | ICD-10-CM | POA: Diagnosis not present

## 2016-11-04 DIAGNOSIS — R2681 Unsteadiness on feet: Secondary | ICD-10-CM

## 2016-11-04 NOTE — Therapy (Addendum)
[place], [place], [address] Phone: [phone]   Fax:  [phone]  Physical Therapy Treatment  Patient Details  Name: Shelly Cervantes MRN: [id number] Date of Birth: 1935/11/13 Referring Provider: Dr. Excell Seltzer  Encounter Date: 11/04/2016      PT End of Session - 11/04/16 1733    PT Start Time 1447  patient late for appt. today   PT Stop Time 1519   PT Time Calculation (min) 32 min   Activity Tolerance Patient tolerated treatment well   Behavior During Therapy [facility] for tasks assessed/performed      Past Medical History:  Diagnosis Date  . Anxiety   . Depression   . High cholesterol   . Hypertension     No past surgical history on file.  There were no vitals filed for this visit.      Subjective Assessment - 11/04/16 1450    Subjective Just got the MRI results yesterday.  It's a little larger than the mammogram showed, about 5 cm., but we still think the plan is a lumpectomy.  Still waiting to find out treatment plans. Husband reports that the patient gets dizzy easily and she blames the sinus problems.   Patient is accompained by: Family member  husband   Pertinent History Patient was diagnosed on 10/08/16 with left grade 2 invasive lobular carcinoma breast cancer. It is located in the upper outer quadrant and measures 2.5 cm. It is ER positive, PR negative, and there was insufficient tissue to determine HER2 status on biopsy. Ki67 is 10%. She is currently under treatment for Alzheimer's but the patient and family report she is otherwise independent and living at home with her husband.   Patient Stated Goals none   Currently in Pain? Yes   Pain Score 8    Pain Location Other (Comment)  sinuses   Pain Descriptors / Indicators Aching   Aggravating Factors  seasonal   Pain Relieving Factors take tylenol            [facility] PT Assessment - 11/04/16 0001      ROM / Strength   AROM  / PROM / Strength (P)  Strength     Strength   Strength Assessment Site (P)  Hip;Knee;Ankle   Right/Left Hip (P)  Right;Left   Right Hip Flexion (P)  5/5   Right Hip Extension (P)  4-/5   Right Hip ABduction (P)  5/5   Right Hip ADduction (P)  3+/5   Left Hip Flexion (P)  5/5   Left Hip Extension (P)  4+/5   Left Hip ABduction (P)  5/5   Left Hip ADduction (P)  3+/5   Right/Left Knee (P)  Right;Left   Right Knee Flexion (P)  5/5   Right Knee Extension (P)  4+/5   Left Knee Flexion (P)  5/5   Left Knee Extension (P)  5/5   Right/Left Ankle (P)  Right;Left   Right Ankle Dorsiflexion (P)  5/5   Right Ankle Inversion (P)  5/5   Right Ankle Eversion (P)  5/5   Left Ankle Dorsiflexion (P)  5/5   Left Ankle Inversion (P)  5/5   Left Ankle Eversion (P)  4+/5     Standardized Balance Assessment   Standardized Balance Assessment (P)  Berg Balance Test     Berg Balance Test   Sit to Stand (P)  Able to stand without using hands and stabilize independently   Standing Unsupported (  P)  Able to stand safely 2 minutes   Sitting with Back Unsupported but Feet Supported on Floor or Stool (P)  Able to sit safely and securely 2 minutes   Stand to Sit (P)  Sits safely with minimal use of hands   Transfers (P)  Able to transfer safely, minor use of hands   Standing Unsupported with Eyes Closed (P)  Able to stand 10 seconds safely   Standing Ubsupported with Feet Together (P)  Able to place feet together independently and stand 1 minute safely   From Standing, Reach Forward with Outstretched Arm (P)  Can reach confidently >25 cm (10")   From Standing Position, Pick up Object from Floor (P)  Able to pick up shoe safely and easily   From Standing Position, Turn to Look Behind Over each Shoulder (P)  Looks behind from both sides and weight shifts well   Turn 360 Degrees (P)  Able to turn 360 degrees safely but slowly   Standing Unsupported, Alternately Place Feet on Step/Stool (P)  Able to stand  independently and complete 8 steps >20 seconds   Standing Unsupported, One Foot in Front (P)  Able to plae foot ahead of the other independently and hold 30 seconds   Standing on One Leg (P)  Tries to lift leg/unable to hold 3 seconds but remains standing independently   Total Score (P)  49                                   Breast Clinic Goals - 10/20/16 2226      Patient will be able to verbalize understanding of pertinent lymphedema risk reduction practices relevant to her diagnosis specifically related to skin care.   Time 1   Period Days   Status Achieved     Patient will be able to return demonstrate and/or verbalize understanding of the post-op home exercise program related to regaining shoulder range of motion.   Time 1   Period Days   Status Achieved     Patient will be able to verbalize understanding of the importance of attending the postoperative After Breast Cancer Class for further lymphedema risk reduction education and therapeutic exercise.   Time 1   Period Days   Status Achieved          Long Term Clinic Goals - 11/04/16 1738      CC Long Term Goal  #1   Title Patient will be independent with her home exercise program for functional strengthening with hre husband's assistance.   Status On-going     CC Long Term Goal  #2   Title Patient's family will report >/= 25% improvement in tolerance for doing tasks which require walking such as shopping.   Status On-going     CC Long Term Goal  #3   Title Improve BERG balance score by >/= 4 points to reduce fall risk.   Baseline 49/56 on initial Berg performance   Status On-going     CC Long Term Goal  #4   Title Patient and/or husband will report she is able to stand for >/= 20 minutes without sitting to tolerate showering and dressing without rest.   Status On-going     CC Long Term Goal  #5   Title Patient and/or husband will report a good understanding of a walking program and that  it has been implemented.   Status On-going  Plan - 11/04/16 1733    Clinical Impression Statement Patient with dementia as noted previously asked several times today, "Why am I doing this?" She was accompanied by her good-natured husband today.  She scored 49/56 on Berg balance test, struggling just with higher level activities involving single leg stance, narrow base of support and the like.  She does have a risk for falls with that score.  Her LE strength is fairly good overall, but with weakness noted in hip extensors and adductors, so she would benefit from LE strengthening.  The couple is not yet sure of the treatment plan for her breast cancer.   Rehab Potential Good   Clinical Impairments Affecting Rehab Potential Alzheimer's   PT Frequency 2x / week   PT Duration 4 weeks   PT Treatment/Interventions ADLs/Self Care Home Management;Therapeutic activities;Therapeutic exercise;Balance training;Neuromuscular re-education;Patient/family education;Functional mobility training;Energy conservation   PT Next Visit Plan Begin HEP for functional strengthening and high level balance retraining   PT Home Exercise Plan Post op shoulder ROM HEP   Consulted and Agree with Plan of Care Patient;Family member/caregiver  husband      Patient will benefit from skilled therapeutic intervention in order to improve the following deficits and impairments:  Decreased activity tolerance, Decreased balance, Decreased cognition, Decreased mobility, Decreased knowledge of precautions, Decreased endurance, Decreased range of motion, Decreased strength, Postural dysfunction, Impaired UE functional use, Difficulty walking  Visit Diagnosis: Muscle weakness (generalized)  Unsteadiness on feet     Problem List Patient Active Problem List   Diagnosis Date Noted  . Malignant neoplasm of upper-outer quadrant of left breast in female, estrogen receptor positive ([place]) 10/14/2016     Jwan Hornbaker 11/04/2016, 5:41 PM  [place], [place], [address] Phone: [phone]   Fax:  [phone]  Name: ADALAE BAYSINGER MRN: [id number] Date of Birth: [birth year]  Serafina Royals, PT 11/04/16 5:41 PM  PHYSICAL THERAPY DISCHARGE SUMMARY  Visits from Start of Care: 2  Current functional level related to goals / functional outcomes: Unknown, as the patient did not return after this second visit to complete the planned course of therapy.   Remaining deficits: Unknown   Education / Equipment: none Plan: Patient agrees to discharge.  Patient goals were not met. Patient is being discharged due to not returning since the last visit.  ?????    Serafina Royals, PT 07/07/17 4:10 PM

## 2016-11-11 ENCOUNTER — Other Ambulatory Visit: Payer: Self-pay | Admitting: *Deleted

## 2016-11-11 DIAGNOSIS — C50412 Malignant neoplasm of upper-outer quadrant of left female breast: Secondary | ICD-10-CM

## 2016-11-11 DIAGNOSIS — Z170 Estrogen receptor positive status [ER+]: Principal | ICD-10-CM

## 2016-11-12 NOTE — Pre-Procedure Instructions (Signed)
SHERRAL DIROCCO  11/12/2016      [id number] [place], [place] [address] [place] [address] Phone: [phone] Fax: [phone]    Your procedure is scheduled on August 16  Report to [facility] at [place].M.  Call this number if you have problems the morning of surgery:  [phone]   Remember:  Do not eat food or drink liquids after midnight.   Take these medicines the morning of surgery with A SIP OF WATER anastrozole (ARIMIDEX),  buPROPion (WELLBUTRIN XL),  donepezil (ARICEPT), memantine (NAMENDA), eye drops,   7 days prior to surgery STOP taking any Aleve, Naproxen, Ibuprofen, Motrin, Advil, Goody's, BC's, all herbal medications, fish oil, and all vitamins  Follow your doctors instructions regarding your Aspirin.  If no instructions were given by the doctor you will need to call the office to get instructions.  Your pre admission RN will also call for those instructions    Do not wear jewelry, make-up or nail polish.  Do not wear lotions, powders, or perfumes, or deoderant.  Do not shave 48 hours prior to surgery.  Men may shave face and neck.  Do not bring valuables to the hospital.  [facility] is not responsible for any belongings or valuables.  Contacts, dentures or bridgework may not be worn into surgery.  Leave your suitcase in the car.  After surgery it may be brought to your room.  For patients admitted to the hospital, discharge time will be determined by your treatment team.  Patients discharged the day of surgery will not be allowed to drive home.    Special instructions:   [facility]- Preparing For Surgery  Before surgery, you can play an important role. Because skin is not sterile, your skin needs to be as free of germs as possible. You can reduce the number of germs on your skin by washing with CHG (chlorahexidine gluconate) Soap before surgery.  CHG is an antiseptic cleaner which kills germs  and bonds with the skin to continue killing germs even after washing.  Please do not use if you have an allergy to CHG or antibacterial soaps. If your skin becomes reddened/irritated stop using the CHG.  Do not shave (including legs and underarms) for at least 48 hours prior to first CHG shower. It is OK to shave your face.  Please follow these instructions carefully.   1. Shower the NIGHT BEFORE SURGERY and the MORNING OF SURGERY with CHG.   2. If you chose to wash your hair, wash your hair first as usual with your normal shampoo.  3. After you shampoo, rinse your hair and body thoroughly to remove the shampoo.  4. Use CHG as you would any other liquid soap. You can apply CHG directly to the skin and wash gently with a scrungie or a clean washcloth.   5. Apply the CHG Soap to your body ONLY FROM THE NECK DOWN.  Do not use on open wounds or open sores. Avoid contact with your eyes, ears, mouth and genitals (private parts). Wash genitals (private parts) with your normal soap.  6. Wash thoroughly, paying special attention to the area where your surgery will be performed.  7. Thoroughly rinse your body with warm water from the neck down.  8. DO NOT shower/wash with your normal soap after using and rinsing off the CHG Soap.  9. Pat yourself dry with a CLEAN TOWEL.   10. Wear CLEAN PAJAMAS  11. Place CLEAN SHEETS on your bed the night of your first shower and DO NOT SLEEP WITH PETS.    Day of Surgery: Do not apply any deodorants/lotions. Please wear clean clothes to the hospital/surgery center.      Please read over the following fact sheets that you were given.

## 2016-11-15 ENCOUNTER — Encounter (HOSPITAL_COMMUNITY)
Admission: RE | Admit: 2016-11-15 | Discharge: 2016-11-15 | Disposition: A | Discharge status: Home / Self Care | Payer: Medicare HMO | Source: Ambulatory Visit | Attending: General Surgery | Admitting: General Surgery

## 2016-11-15 ENCOUNTER — Encounter (HOSPITAL_COMMUNITY): Payer: Self-pay | Admitting: *Deleted

## 2016-11-15 DIAGNOSIS — Z01818 Encounter for other preprocedural examination: Secondary | ICD-10-CM | POA: Insufficient documentation

## 2016-11-15 DIAGNOSIS — C50912 Malignant neoplasm of unspecified site of left female breast: Secondary | ICD-10-CM | POA: Diagnosis not present

## 2016-11-15 HISTORY — DX: Gastro-esophageal reflux disease without esophagitis: K21.9

## 2016-11-15 HISTORY — DX: Dementia in other diseases classified elsewhere, unspecified severity, without behavioral disturbance, psychotic disturbance, mood disturbance, and anxiety: F02.80

## 2016-11-15 HISTORY — DX: Alzheimer's disease, unspecified: G30.9

## 2016-11-15 LAB — BASIC METABOLIC PANEL
ANION GAP: 9 (ref 5–15)
BUN: 11 mg/dL (ref 6–20)
CALCIUM: 9.7 mg/dL (ref 8.9–10.3)
CO2: 26 mmol/L (ref 22–32)
CREATININE: 1.02 mg/dL — AB (ref 0.44–1.00)
Chloride: 106 mmol/L (ref 101–111)
GFR, EST AFRICAN AMERICAN: 59 mL/min — AB (ref >60)
GFR, EST NON AFRICAN AMERICAN: 51 mL/min — AB (ref >60)
Glucose, Bld: 107 mg/dL — ABNORMAL HIGH (ref 65–99)
Potassium: 3.7 mmol/L (ref 3.5–5.1)
SODIUM: 141 mmol/L (ref 135–145)

## 2016-11-15 LAB — CBC
HCT: 38.9 % (ref 36.0–46.0)
Hemoglobin: 12.9 g/dL (ref 12.0–15.0)
MCH: 30.4 pg (ref 26.0–34.0)
MCHC: 33.2 g/dL (ref 30.0–36.0)
MCV: 91.7 fL (ref 78.0–100.0)
PLATELETS: 158 10*3/uL (ref 150–400)
RBC: 4.24 MIL/uL (ref 3.87–5.11)
RDW: 12.6 % (ref 11.5–15.5)
WBC: 4.5 10*3/uL (ref 4.0–10.5)

## 2016-11-15 NOTE — Pre-Procedure Instructions (Addendum)
Shelly Cervantes  11/15/2016      [id number] [place], [place] - [place] [address] Phone: [phone] Fax: [phone]    Your procedure is scheduled on August 16  Report to [facility] at [place].M.  Call this number if you have problems the morning of surgery:  [phone]   Remember:  Do not eat food or drink liquids after midnight.  Drink breeze boost at 330 am day of surgery   Take these medicines the morning of surgery with A SIP OF WATER anastrozole (ARIMIDEX),  buPROPion (WELLBUTRIN XL),buspirone(buspar)  donepezil (ARICEPT), memantine (NAMENDA)  7 days prior to surgery 11/19/16 STOP taking any Aleve, Naproxen, Ibuprofen, Motrin, Advil, Goody's, BC's, all herbal medications, fish oil, and all vitamins,aspirin    Do not wear jewelry, make-up or nail polish.  Do not wear lotions, powders, or perfumes, or deoderant.  Do not shave 48 hours prior to surgery.  Men may shave face and neck.  Do not bring valuables to the hospital.  [facility] is not responsible for any belongings or valuables.  Contacts, dentures or bridgework may not be worn into surgery.  Leave your suitcase in the car.  After surgery it may be brought to your room.  For patients admitted to the hospital, discharge time will be determined by your treatment team.  Patients discharged the day of surgery will not be allowed to drive home.    Special instructions:   [name] Preparing For Surgery  Before surgery, you can play an important role. Because skin is not sterile, your skin needs to be as free of germs as possible. You can reduce the number of germs on your skin by washing with CHG (chlorahexidine gluconate) Soap before surgery.  CHG is an antiseptic cleaner which kills germs and bonds with the skin to continue killing germs even after washing.  Please do not use if you have an allergy to CHG or antibacterial soaps. If your  skin becomes reddened/irritated stop using the CHG.  Do not shave (including legs and underarms) for at least 48 hours prior to first CHG shower. It is OK to shave your face.  Please follow these instructions carefully.   1. Shower the NIGHT BEFORE SURGERY and the MORNING OF SURGERY with CHG.   2. If you chose to wash your hair, wash your hair first as usual with your normal shampoo.  3. After you shampoo, rinse your hair and body thoroughly to remove the shampoo.  4. Use CHG as you would any other liquid soap. You can apply CHG directly to the skin and wash gently with a scrungie or a clean washcloth.   5. Apply the CHG Soap to your body ONLY FROM THE NECK DOWN.  Do not use on open wounds or open sores. Avoid contact with your eyes, ears, mouth and genitals (private parts). Wash genitals (private parts) with your normal soap.  6. Wash thoroughly, paying special attention to the area where your surgery will be performed.  7. Thoroughly rinse your body with warm water from the neck down.  8. DO NOT shower/wash with your normal soap after using and rinsing off the CHG Soap.  9. Pat yourself dry with a CLEAN TOWEL.   10. Wear CLEAN PAJAMAS   11. Place CLEAN SHEETS on your bed the night of your first shower and DO NOT SLEEP WITH PETS.  Day of Surgery: Do not apply any deodorants/lotions.  Please wear clean clothes to the hospital/surgery center.    Please read over the following fact sheets that you were given.

## 2016-11-17 ENCOUNTER — Ambulatory Visit: Payer: Medicare HMO | Admitting: Physical Therapy

## 2016-11-22 ENCOUNTER — Encounter: Payer: Medicare HMO | Admitting: Physical Therapy

## 2016-11-24 ENCOUNTER — Encounter: Payer: Medicare HMO | Admitting: Physical Therapy

## 2016-11-24 DIAGNOSIS — C50412 Malignant neoplasm of upper-outer quadrant of left female breast: Secondary | ICD-10-CM | POA: Diagnosis not present

## 2016-11-24 MED ORDER — CELECOXIB 200 MG PO CAPS
400.0000 mg | ORAL_CAPSULE | ORAL | Status: AC
  Administered 2016-11-25: 400 mg via ORAL
  Filled 2016-11-24: qty 2

## 2016-11-24 MED ORDER — GABAPENTIN 300 MG PO CAPS
300.0000 mg | ORAL_CAPSULE | ORAL | Status: AC
  Administered 2016-11-25: 300 mg via ORAL
  Filled 2016-11-24: qty 1

## 2016-11-24 MED ORDER — CEFAZOLIN SODIUM-DEXTROSE 2-4 GM/100ML-% IV SOLN
2.0000 g | INTRAVENOUS | Status: AC
  Administered 2016-11-25: 2 g via INTRAVENOUS
  Filled 2016-11-24: qty 100

## 2016-11-24 MED ORDER — ACETAMINOPHEN 500 MG PO TABS
1000.0000 mg | ORAL_TABLET | ORAL | Status: AC
  Administered 2016-11-25: 1000 mg via ORAL
  Filled 2016-11-24: qty 2

## 2016-11-24 NOTE — H&P (Signed)
History of Present Illness  The patient is a 81 year old female who presents with breast cancer. She is a post menopausal female referred by Dr. Ladonna Snide a for evaluation of recently diagnosed carcinoma of the left breast. her primary physician Dr. Harrington Challenger recently noted a possible mass in her left breast on examination. Subsequent imaging included diagnostic mamogram showing a new approximately 3 cm area of irregular asymmetry in the upper outer quadrant middle depth with associated distortion and ultrasound showing a 2.5 cm irregular mass with indistinct margins in the upper outer breast corresponding to the mammographic finding. Axilla was negative. An ultrasound guided breast biopsy was performed on 10/12/2014 with pathology revealing invasive lobular carcinoma of the breast. She is seen now in breast multidisciplinary clinic for initial treatment planning. She has experienced a breast lump but denies pain or skin changes or nipple discharge. She does not have a personal history of any previous breast problems.  Findings at that time were the following: Tumor size: 2.5 cm Tumor grade: 2 Estrogen Receptor: positive Progesterone Receptor: negative Her-2 neu: insufficient tissue Lymph node status: negative    Problem List/Past Medical  LATE ONSET ALZHEIMER'S DISEASE WITHOUT BEHAVIORAL DISTURBANCE (G30.1)   Past Surgical History  No pertinent past surgical history   Diagnostic Studies History  Colonoscopy  5-10 years ago Mammogram  within last year Pap Smear  >5 years ago  Medication History  Medications Reconciled  Social History  Alcohol use  Occasional alcohol use. Caffeine use  Carbonated beverages, Coffee. No drug use  Tobacco use  Never smoker.  Family History  Family history unknown  First Degree Relatives   Pregnancy / Birth History  Age at menarche  30 years. Age of menopause  >62 Contraceptive History  Oral contraceptives. Gravida  2 Maternal age   30-25 Para  2  Other Problems  Breast Cancer  Depression  High blood pressure  Hypercholesterolemia  Lump In Breast     Review of Systems  General Not Present- Appetite Loss, Chills, Fatigue, Fever, Night Sweats, Weight Gain and Weight Loss. HEENT Present- Seasonal Allergies, Sinus Pain and Wears glasses/contact lenses. Not Present- Earache, Hearing Loss, Hoarseness, Nose Bleed, Oral Ulcers, Ringing in the Ears, Sore Throat, Visual Disturbances and Yellow Eyes. Respiratory Not Present- Bloody sputum, Chronic Cough, Difficulty Breathing, Snoring and Wheezing. Breast Present- Breast Mass. Not Present- Breast Pain, Nipple Discharge and Skin Changes. Cardiovascular Not Present- Chest Pain, Difficulty Breathing Lying Down, Leg Cramps, Palpitations, Rapid Heart Rate, Shortness of Breath and Swelling of Extremities. Gastrointestinal Present- Abdominal Pain. Not Present- Bloating, Bloody Stool, Change in Bowel Habits, Chronic diarrhea, Constipation, Difficulty Swallowing, Excessive gas, Gets full quickly at meals, Hemorrhoids, Indigestion, Nausea, Rectal Pain and Vomiting. Neurological Present- Decreased Memory. Not Present- Fainting, Headaches, Numbness, Seizures, Tingling, Tremor, Trouble walking and Weakness. Psychiatric Present- Depression. Not Present- Anxiety, Bipolar, Change in Sleep Pattern, Fearful and Frequent crying. Endocrine Not Present- Cold Intolerance, Excessive Hunger, Hair Changes, Heat Intolerance, Hot flashes and New Diabetes. Hematology Present- Blood Thinners. Not Present- Easy Bruising, Excessive bleeding, Gland problems, HIV and Persistent Infections.   Physical Exam  The physical exam findings are as follows: Note:General: Alert, well-developed and well nourished Caucasian female, in no distress Skin: Warm and dry without rash or infection. HEENT: No palpable masses or thyromegaly. Sclera nonicteric. Pupils equal round and reactive. Oropharynx clear. Lymph  nodes: No cervical, supraclavicular, nodes palpable. Breasts: There is a somewhat soft fairly indistinct approximately 3 cm mass palpable in the upper outer left  breast. No skin changes or nipple inversion or er steast.No other palpable abnormalities in either breast. no palpable axillary adenopathy. Lungs: Breath sounds clear and equal. No wheezing or increased work of breathing. Cardiovascular: Regular rate and rhythm without murmer. No JVD or edema. Abdomen: Nondistended. Soft and nontender. No masses palpable. No organomegaly. No palpable hernias. Extremities: No edema or joint swelling or deformity. No chronic venous stasis changes. Neurologic: Alert and responsive but oriented to person and somewhat to the situation only. Gait normal. No focal weakness. Psychiatric: Normal mood and affect. appears to have minimal insight to her problems.    Assessment & Plan  LOBULAR BREAST CANCER, LEFT (F41.423) Impression: 81 year old female with a new diagnosis of cancer of the left breast, upper outer quadrant. Clinical stage 1B, ER positive, PR negative, an insufficient tissue for HER-2 analysis. I discussed with the patient and family members present today initial surgical treatment options. We discussed options of breast conservation with lumpectomy or total mastectomy and sentinal lymph node biopsy/dissection. she and her family would prefer breast conservation with minimal surgery if at all possible. with a moderately large lobular cancer I recommended a preoperative MRI to assess the extent of her tumor before definitely recommending lumpectomy with sentinel lymph node biopsy. She has been started on Anastrazole.  We discussed the indications and nature of the procedure, and expected recovery, in detail. Surgical risks including anesthetic complications, cardiorespiratory complications, bleeding, infection, wound healing complications, blood clots, lymphedema, local and distant recurrence and possible  need for further surgery based on the final pathology was discussed and understood. Chemotherapy, hormonal therapy and radiation therapy have been discussed. They have been provided with literature regarding the treatment of breast cancer. All questions were answered. MRI: FINDINGS: Breast composition: b. Scattered fibroglandular tissue.  Background parenchymal enhancement: Moderate.  Right breast: No mass or abnormal enhancement.  Left breast: Asymmetric non masslike patchy and nodular enhancement is seen in the 12 o'clock region of the left breast measuring 3.7 x 2.3 x 4.4 cm. Signal void artifact is seen in the lateral aspect of the abnormal enhancement peer  Lymph nodes: No abnormal appearing lymph nodes.  Ancillary findings:  None.  IMPRESSION: Abnormal enhancement in the superior aspect of the left breast corresponding with the biopsy proven invasive mammary carcinoma.  RECOMMENDATION: Treatment planning of the known left breast invasive mammary carcinoma is recommended  Plan Right breast lumpectomy with axillary SLN biopsy

## 2016-11-25 ENCOUNTER — Ambulatory Visit (HOSPITAL_COMMUNITY)
Admission: RE | Admit: 2016-11-25 | Discharge: 2016-11-25 | Disposition: A | Discharge status: Home / Self Care | Payer: Medicare HMO | Source: Ambulatory Visit | Attending: General Surgery | Admitting: General Surgery

## 2016-11-25 ENCOUNTER — Ambulatory Visit (HOSPITAL_COMMUNITY): Payer: Medicare HMO | Admitting: Anesthesiology

## 2016-11-25 ENCOUNTER — Ambulatory Visit (HOSPITAL_COMMUNITY): Payer: Medicare HMO

## 2016-11-25 ENCOUNTER — Encounter (HOSPITAL_COMMUNITY): Payer: Self-pay

## 2016-11-25 ENCOUNTER — Encounter (HOSPITAL_COMMUNITY)
Admission: RE | Disposition: A | Discharge status: Home / Self Care | Payer: Self-pay | Source: Ambulatory Visit | Attending: General Surgery

## 2016-11-25 DIAGNOSIS — I10 Essential (primary) hypertension: Secondary | ICD-10-CM | POA: Insufficient documentation

## 2016-11-25 DIAGNOSIS — C773 Secondary and unspecified malignant neoplasm of axilla and upper limb lymph nodes: Secondary | ICD-10-CM | POA: Insufficient documentation

## 2016-11-25 DIAGNOSIS — F0280 Dementia in other diseases classified elsewhere without behavioral disturbance: Secondary | ICD-10-CM | POA: Diagnosis not present

## 2016-11-25 DIAGNOSIS — Z170 Estrogen receptor positive status [ER+]: Secondary | ICD-10-CM | POA: Diagnosis not present

## 2016-11-25 DIAGNOSIS — C50912 Malignant neoplasm of unspecified site of left female breast: Secondary | ICD-10-CM

## 2016-11-25 DIAGNOSIS — G8918 Other acute postprocedural pain: Secondary | ICD-10-CM | POA: Diagnosis not present

## 2016-11-25 DIAGNOSIS — G309 Alzheimer's disease, unspecified: Secondary | ICD-10-CM | POA: Insufficient documentation

## 2016-11-25 DIAGNOSIS — C50412 Malignant neoplasm of upper-outer quadrant of left female breast: Secondary | ICD-10-CM | POA: Diagnosis not present

## 2016-11-25 DIAGNOSIS — R69 Illness, unspecified: Secondary | ICD-10-CM | POA: Diagnosis not present

## 2016-11-25 HISTORY — PX: BREAST LUMPECTOMY WITH RADIOACTIVE SEED AND SENTINEL LYMPH NODE BIOPSY: SHX6550

## 2016-11-25 SURGERY — BREAST LUMPECTOMY WITH RADIOACTIVE SEED AND SENTINEL LYMPH NODE BIOPSY
Anesthesia: Regional | Site: Breast | Laterality: Left

## 2016-11-25 MED ORDER — PHENYLEPHRINE 40 MCG/ML (10ML) SYRINGE FOR IV PUSH (FOR BLOOD PRESSURE SUPPORT)
PREFILLED_SYRINGE | INTRAVENOUS | Status: DC | PRN
  Administered 2016-11-25: 120 ug via INTRAVENOUS

## 2016-11-25 MED ORDER — DEXAMETHASONE SODIUM PHOSPHATE 10 MG/ML IJ SOLN
INTRAMUSCULAR | Status: DC | PRN
  Administered 2016-11-25: 10 mg via INTRAVENOUS

## 2016-11-25 MED ORDER — LIDOCAINE 2% (20 MG/ML) 5 ML SYRINGE
INTRAMUSCULAR | Status: DC | PRN
  Administered 2016-11-25: 60 mg via INTRAVENOUS

## 2016-11-25 MED ORDER — DEXAMETHASONE SODIUM PHOSPHATE 10 MG/ML IJ SOLN
INTRAMUSCULAR | Status: AC
  Filled 2016-11-25: qty 1

## 2016-11-25 MED ORDER — LIDOCAINE 2% (20 MG/ML) 5 ML SYRINGE
INTRAMUSCULAR | Status: AC
  Filled 2016-11-25: qty 5

## 2016-11-25 MED ORDER — METHYLENE BLUE 0.5 % INJ SOLN
INTRAVENOUS | Status: AC
  Filled 2016-11-25: qty 10

## 2016-11-25 MED ORDER — TRAMADOL HCL 50 MG PO TABS: 50.0000 mg | ORAL_TABLET | Freq: Four times a day (QID) | ORAL | 1 refills | Status: DC | PRN

## 2016-11-25 MED ORDER — FENTANYL CITRATE (PF) 250 MCG/5ML IJ SOLN
INTRAMUSCULAR | Status: DC | PRN
  Administered 2016-11-25: 50 ug via INTRAVENOUS

## 2016-11-25 MED ORDER — BUPIVACAINE-EPINEPHRINE (PF) 0.25% -1:200000 IJ SOLN
INTRAMUSCULAR | Status: AC
  Filled 2016-11-25: qty 30

## 2016-11-25 MED ORDER — ONDANSETRON HCL 4 MG/2ML IJ SOLN
INTRAMUSCULAR | Status: DC | PRN
  Administered 2016-11-25: 4 mg via INTRAVENOUS

## 2016-11-25 MED ORDER — FENTANYL CITRATE (PF) 100 MCG/2ML IJ SOLN: 25.0000 ug | INTRAMUSCULAR | Status: DC | PRN

## 2016-11-25 MED ORDER — BUPIVACAINE-EPINEPHRINE 0.25% -1:200000 IJ SOLN
INTRAMUSCULAR | Status: DC | PRN
  Administered 2016-11-25: 20 mL

## 2016-11-25 MED ORDER — LACTATED RINGERS IV SOLN
INTRAVENOUS | Status: DC | PRN
  Administered 2016-11-25 (×2): via INTRAVENOUS

## 2016-11-25 MED ORDER — ONDANSETRON HCL 4 MG/2ML IJ SOLN
INTRAMUSCULAR | Status: AC
  Filled 2016-11-25: qty 2

## 2016-11-25 MED ORDER — PROPOFOL 10 MG/ML IV BOLUS
INTRAVENOUS | Status: DC | PRN
  Administered 2016-11-25: 25 mg via INTRAVENOUS
  Administered 2016-11-25: 50 mg via INTRAVENOUS
  Administered 2016-11-25: 25 mg via INTRAVENOUS
  Administered 2016-11-25 (×3): 50 mg via INTRAVENOUS

## 2016-11-25 MED ORDER — CHLORHEXIDINE GLUCONATE CLOTH 2 % EX PADS: 6.0000 | MEDICATED_PAD | Freq: Once | CUTANEOUS | Status: DC

## 2016-11-25 MED ORDER — ONDANSETRON HCL 4 MG/2ML IJ SOLN: 4.0000 mg | Freq: Once | INTRAMUSCULAR | Status: DC | PRN

## 2016-11-25 MED ORDER — PROPOFOL 10 MG/ML IV BOLUS
INTRAVENOUS | Status: AC
  Filled 2016-11-25: qty 20

## 2016-11-25 MED ORDER — PROPOFOL 500 MG/50ML IV EMUL
INTRAVENOUS | Status: DC | PRN
  Administered 2016-11-25: 75 ug/kg/min via INTRAVENOUS

## 2016-11-25 MED ORDER — 0.9 % SODIUM CHLORIDE (POUR BTL) OPTIME
TOPICAL | Status: DC | PRN
Start: 2016-11-25 — End: 2016-11-25
  Administered 2016-11-25: 1000 mL

## 2016-11-25 MED ORDER — PROPOFOL 1000 MG/100ML IV EMUL
INTRAVENOUS | Status: AC
  Filled 2016-11-25: qty 100

## 2016-11-25 MED ORDER — ROPIVACAINE HCL 7.5 MG/ML IJ SOLN
INTRAMUSCULAR | Status: DC | PRN
  Administered 2016-11-25: 20 mL via PERINEURAL

## 2016-11-25 MED ORDER — FENTANYL CITRATE (PF) 250 MCG/5ML IJ SOLN
INTRAMUSCULAR | Status: AC
  Filled 2016-11-25: qty 5

## 2016-11-25 MED ORDER — PHENYLEPHRINE 40 MCG/ML (10ML) SYRINGE FOR IV PUSH (FOR BLOOD PRESSURE SUPPORT)
PREFILLED_SYRINGE | INTRAVENOUS | Status: AC
  Filled 2016-11-25: qty 10

## 2016-11-25 MED ORDER — TECHNETIUM TC 99M SULFUR COLLOID FILTERED
1.0000 | Freq: Once | INTRAVENOUS | Status: AC | PRN
  Administered 2016-11-25: 1 via INTRADERMAL

## 2016-11-25 SURGICAL SUPPLY — 57 items
ADH SKN CLS APL DERMABOND .7 (GAUZE/BANDAGES/DRESSINGS) ×1
APPLIER CLIP 9.375 MED OPEN (MISCELLANEOUS)
APR CLP MED 9.3 20 MLT OPN (MISCELLANEOUS)
BINDER BREAST LRG (GAUZE/BANDAGES/DRESSINGS) IMPLANT
BINDER BREAST XLRG (GAUZE/BANDAGES/DRESSINGS) IMPLANT
BINDER BREAST XXLRG (GAUZE/BANDAGES/DRESSINGS) ×1 IMPLANT
BLADE SURG 15 STRL LF DISP TIS (BLADE) ×1 IMPLANT
BLADE SURG 15 STRL SS (BLADE) ×2
CANISTER SUCT 3000ML PPV (MISCELLANEOUS) ×2 IMPLANT
CHLORAPREP W/TINT 26ML (MISCELLANEOUS) ×2 IMPLANT
CLIP APPLIE 9.375 MED OPEN (MISCELLANEOUS) IMPLANT
CLIP VESOCCLUDE MED 6/CT (CLIP) ×2 IMPLANT
CONT SPEC 4OZ CLIKSEAL STRL BL (MISCELLANEOUS) ×2 IMPLANT
COVER PROBE W GEL 5X96 (DRAPES) ×2 IMPLANT
COVER SURGICAL LIGHT HANDLE (MISCELLANEOUS) ×2 IMPLANT
DERMABOND ADVANCED (GAUZE/BANDAGES/DRESSINGS) ×1
DERMABOND ADVANCED .7 DNX12 (GAUZE/BANDAGES/DRESSINGS) ×1 IMPLANT
DEVICE DUBIN SPECIMEN MAMMOGRA (MISCELLANEOUS) ×2 IMPLANT
DRAPE CHEST BREAST 15X10 FENES (DRAPES) ×2 IMPLANT
DRAPE SURG 17X23 STRL (DRAPES) ×8 IMPLANT
DRAPE UTILITY XL STRL (DRAPES) ×2 IMPLANT
ELECT COATED BLADE 2.86 ST (ELECTRODE) ×2 IMPLANT
ELECT REM PT RETURN 9FT ADLT (ELECTROSURGICAL) ×2
ELECTRODE REM PT RTRN 9FT ADLT (ELECTROSURGICAL) ×1 IMPLANT
GLOVE BIOGEL PI IND STRL 8 (GLOVE) ×1 IMPLANT
GLOVE BIOGEL PI IND STRL 8.5 (GLOVE) IMPLANT
GLOVE BIOGEL PI INDICATOR 8 (GLOVE) ×1
GLOVE BIOGEL PI INDICATOR 8.5 (GLOVE) ×1
GLOVE ECLIPSE 7.5 STRL STRAW (GLOVE) ×5 IMPLANT
GLOVE ECLIPSE 8.0 STRL XLNG CF (GLOVE) ×1 IMPLANT
GOWN STRL REUS W/ TWL LRG LVL3 (GOWN DISPOSABLE) ×1 IMPLANT
GOWN STRL REUS W/ TWL XL LVL3 (GOWN DISPOSABLE) ×1 IMPLANT
GOWN STRL REUS W/TWL LRG LVL3 (GOWN DISPOSABLE)
GOWN STRL REUS W/TWL XL LVL3 (GOWN DISPOSABLE) ×4
ILLUMINATOR WAVEGUIDE N/F (MISCELLANEOUS) IMPLANT
KIT BASIN OR (CUSTOM PROCEDURE TRAY) ×2 IMPLANT
KIT MARKER MARGIN INK (KITS) ×2 IMPLANT
NDL FILTER BLUNT 18X1 1/2 (NEEDLE) IMPLANT
NDL HYPO 25GX1X1/2 BEV (NEEDLE) ×1 IMPLANT
NDL SAFETY ECLIPSE 18X1.5 (NEEDLE) IMPLANT
NEEDLE FILTER BLUNT 18X 1/2SAF (NEEDLE)
NEEDLE FILTER BLUNT 18X1 1/2 (NEEDLE) IMPLANT
NEEDLE HYPO 18GX1.5 SHARP (NEEDLE)
NEEDLE HYPO 25GX1X1/2 BEV (NEEDLE) ×2 IMPLANT
NS IRRIG 1000ML POUR BTL (IV SOLUTION) ×2 IMPLANT
PACK SURGICAL SETUP 50X90 (CUSTOM PROCEDURE TRAY) ×2 IMPLANT
PAD ABD 8X10 STRL (GAUZE/BANDAGES/DRESSINGS) ×1 IMPLANT
PENCIL BUTTON HOLSTER BLD 10FT (ELECTRODE) ×2 IMPLANT
SPONGE LAP 18X18 X RAY DECT (DISPOSABLE) ×2 IMPLANT
SUT MON AB 5-0 PS2 18 (SUTURE) ×4 IMPLANT
SUT VIC AB 3-0 SH 18 (SUTURE) ×2 IMPLANT
SYR BULB 3OZ (MISCELLANEOUS) ×2 IMPLANT
SYR CONTROL 10ML LL (SYRINGE) ×2 IMPLANT
TOWEL OR 17X24 6PK STRL BLUE (TOWEL DISPOSABLE) ×2 IMPLANT
TOWEL OR 17X26 10 PK STRL BLUE (TOWEL DISPOSABLE) ×2 IMPLANT
TUBE CONNECTING 12X1/4 (SUCTIONS) ×2 IMPLANT
YANKAUER SUCT BULB TIP NO VENT (SUCTIONS) ×2 IMPLANT

## 2016-11-25 NOTE — Transfer of Care (Signed)
Immediate Anesthesia Transfer of Care Note  Patient: Shelly Cervantes  Procedure(s) Performed: Procedure(s): LEFT BREAST LUMPECTOMY WITH RADIOACTIVE SEED AND AXILLARY SENTINEL LYMPH NODE BIOPSY (Left)  Patient Location: PACU  Anesthesia Type:General  Level of Consciousness: drowsy, patient cooperative and responds to stimulation  Airway & Oxygen Therapy: Patient Spontanous Breathing and Patient connected to face mask oxygen  Post-op Assessment: Report given to RN  Post vital signs: Reviewed and stable  Last Vitals:  Vitals:   11/25/16 0627 11/25/16 0717  BP: (!) 132/51   Pulse: 68 64  Resp: 18 16  Temp: 36.7 C   SpO2: 98% 96%    Last Pain:  Vitals:   11/25/16 0627  TempSrc: Oral      Patients Stated Pain Goal: 4 (30/13/14 3888)  Complications: No apparent anesthesia complications

## 2016-11-25 NOTE — Discharge Instructions (Signed)
[facility] °Office Phone Number [phone] ° °BREAST BIOPSY/ PARTIAL MASTECTOMY: POST OP INSTRUCTIONS ° °Always review your discharge instruction sheet given to you by the facility where your surgery was performed. ° °IF YOU HAVE DISABILITY OR FAMILY LEAVE FORMS, YOU MUST BRING THEM TO THE OFFICE FOR PROCESSING.  DO NOT GIVE THEM TO YOUR DOCTOR. ° °1. A prescription for pain medication may be given to you upon discharge.  Take your pain medication as prescribed, if needed.  If narcotic pain medicine is not needed, then you may take acetaminophen (Tylenol) or ibuprofen (Advil) as needed. °2. Take your usually prescribed medications unless otherwise directed °3. If you need a refill on your pain medication, please contact your pharmacy.  They will contact our office to request authorization.  Prescriptions will not be filled after 5pm or on week-ends. °4. You should eat very light the first 24 hours after surgery, such as soup, crackers, pudding, etc.  Resume your normal diet the day after surgery. °5. Most patients will experience some swelling and bruising in the breast.  Ice packs and a good support bra will help.  Swelling and bruising can take several days to resolve.  °6. It is common to experience some constipation if taking pain medication after surgery.  Increasing fluid intake and taking a stool softener will usually help or prevent this problem from occurring.  A mild laxative (Milk of Magnesia or Miralax) should be taken according to package directions if there are no bowel movements after 48 hours. °7. Unless discharge instructions indicate otherwise, you may remove your bandages 24-48 hours after surgery, and you may shower at that time.  You may have steri-strips (small skin tapes) in place directly over the incision.  These strips should be left on the skin for 7-10 days.  If your surgeon used skin glue on the incision, you may shower in 24 hours.  The glue will flake off over the  next 2-3 weeks.  Any sutures or staples will be removed at the office during your follow-up visit. °8. ACTIVITIES:  You may resume regular daily activities (gradually increasing) beginning the next day.  Wearing a good support bra or sports bra minimizes pain and swelling.  You may have sexual intercourse when it is comfortable. °a. You may drive when you no longer are taking prescription pain medication, you can comfortably wear a seatbelt, and you can safely maneuver your car and apply brakes. °b. RETURN TO WORK:  ______________________________________________________________________________________ °9. You should see your doctor in the office for a follow-up appointment approximately two weeks after your surgery.  Your doctor’s nurse will typically make your follow-up appointment when she calls you with your pathology report.  Expect your pathology report 2-3 business days after your surgery.  You may call to check if you do not hear from us after three days. °10. OTHER INSTRUCTIONS: _______________________________________________________________________________________________ _____________________________________________________________________________________________________________________________________ °_____________________________________________________________________________________________________________________________________ °_____________________________________________________________________________________________________________________________________ ° °WHEN TO CALL YOUR DOCTOR: °1. Fever over 101.0 °2. Nausea and/or vomiting. °3. Extreme swelling or bruising. °4. Continued bleeding from incision. °5. Increased pain, redness, or drainage from the incision. ° °The clinic staff is available to answer your questions during regular business hours.  Please don’t hesitate to call and ask to speak to one of the nurses for clinical concerns.  If you have a medical emergency, go to the nearest  emergency room or call 911.  A surgeon from [facility] is always on call at the hospital. ° °For further questions, please visit [email or web address]  °

## 2016-11-25 NOTE — Interval H&P Note (Signed)
History and Physical Interval Note:  11/25/2016 7:31 AM  Shelly Cervantes  has presented today for surgery, with the diagnosis of LEFT BREAST CANCER  The various methods of treatment have been discussed with the patient and family. After consideration of risks, benefits and other options for treatment, the patient has consented to  Procedure(s): LEFT BREAST LUMPECTOMY WITH RADIOACTIVE SEED AND AXILLARY SENTINEL LYMPH NODE BIOPSY (Left) as a surgical intervention .  The patient's history has been reviewed, patient examined, no change in status, stable for surgery.  I have reviewed the patient's chart and labs.  Questions were answered to the patient's satisfaction.     [name] T

## 2016-11-25 NOTE — Anesthesia Procedure Notes (Signed)
Procedure Name: LMA Insertion Date/Time: 11/25/2016 8:18 AM Performed by: Sampson Si E Pre-anesthesia Checklist: Patient identified, Emergency Drugs available, Suction available and Patient being monitored Patient Re-evaluated:Patient Re-evaluated prior to induction Oxygen Delivery Method: Circle System Utilized Preoxygenation: Pre-oxygenation with 100% oxygen Induction Type: IV induction Ventilation: Mask ventilation without difficulty LMA: LMA inserted LMA Size: 4.0 Number of attempts: 1 Airway Equipment and Method: Bite block Placement Confirmation: positive ETCO2 Tube secured with: Tape Dental Injury: Teeth and Oropharynx as per pre-operative assessment

## 2016-11-25 NOTE — Anesthesia Preprocedure Evaluation (Addendum)
Anesthesia Evaluation  Patient identified by MRN, date of birth, ID band Patient awake    Reviewed: Allergy & Precautions, NPO status , Patient's Chart, lab work & pertinent test results  Airway Mallampati: II  TM Distance: >3 FB Neck ROM: Full    Dental no notable dental hx. (+) Teeth Intact, Partial Upper, Partial Lower, Dental Advisory Given   Pulmonary neg pulmonary ROS,    Pulmonary exam normal breath sounds clear to auscultation       Cardiovascular hypertension, Pt. on medications Normal cardiovascular exam Rhythm:Regular Rate:Normal     Neuro/Psych PSYCHIATRIC DISORDERS Anxiety Depression Alzheimer's disease    GI/Hepatic Neg liver ROS, Medicated and Controlled,  Endo/Other  negative endocrine ROS  Renal/GU negative Renal ROS     Musculoskeletal negative musculoskeletal ROS (+)   Abdominal   Peds  Hematology negative hematology ROS (+)   Anesthesia Other Findings High cholesterol  Reproductive/Obstetrics                           Anesthesia Physical Anesthesia Plan  ASA: III  Anesthesia Plan: General and Regional   Post-op Pain Management: GA combined w/ Regional for post-op pain   Induction: Intravenous  PONV Risk Score and Plan: 3 and Ondansetron, Dexamethasone and Treatment may vary due to age or medical condition  Airway Management Planned: LMA  Additional Equipment:   Intra-op Plan:   Post-operative Plan: Extubation in OR  Informed Consent: I have reviewed the patients History and Physical, chart, labs and discussed the procedure including the risks, benefits and alternatives for the proposed anesthesia with the patient or authorized representative who has indicated his/her understanding and acceptance.   Dental advisory given  Plan Discussed with: CRNA  Anesthesia Plan Comments:        Anesthesia Quick Evaluation

## 2016-11-25 NOTE — Op Note (Signed)
Preoperative Diagnosis: LEFT BREAST CANCER  Postoprative Diagnosis: LEFT BREAST CANCER  Procedure: Procedure(s): LEFT BREAST LUMPECTOMY WITH RADIOACTIVE SEED AND AXILLARY SENTINEL LYMPH NODE BIOPSY   Surgeon: Excell Seltzer T   Assistants: None  Anesthesia:  General LMA anesthesia  Indications: 81 year old female with a new diagnosis of cancer of the left breast, upper outer quadrant. Clinical stage 1B, ER positive, PR negative, HER-2/neu insufficient tumor measuring approximately 3 cm on ultrasound and 4 cm on MRI. After extensive discussion with the patient and her family regarding options and risks with elected to proceed with radioactive seed localized left breast lumpectomy and axillary sentinel lymph node biopsy is initial surgical treatment.    Procedure Detail:  Patient had previously undergone accurate placement of a radioactive seed at the tumor site in the upper outer left breast. Prior to surgery she underwent injection of 1 mCi of technetium sulfur colloid intradermally around the left nipple. She was brought to the operating room, placed in the supine position on the operating table, and laryngeal mask general anesthesia induced. She received preoperative IV antibiotics. The entire left chest and axilla and upper arm were widely sterilely prepped and draped. Patient timeout was performed and correct procedure verified. The neoprobe was used to localize the seed in the upper outer left breast. I made a curvilinear incision in the skin crease directly overlying the area just off the areolar border. Dissection was carried down into the subcutaneous tissue. Short skin and subcutaneous flaps were raised in all directions. There was a palpable mass associated with the area of high counts. Using the neoprobe and the palpable masses guidance I did a generous lumpectomy with cautery back into normal-appearing fatty breast tissue in all directions and the specimen was excised. This was a  globular 6 cm specimen. The margins were marked with ink. Specimen x-ray showed the seed and marking clip centrally located within the specimen. This was sent for permanent pathology. Hemostasis was assured in the wound. The lumpectomy cavity was marked with clips. The deep breast and subcutaneous tissue was closed with interrupted 3-0 Vicryl. Attention was turned to the sentinel lymph node. A hot area in the left axilla was identified and a transverse incision made in the skin crease. Dissection was carried down through the subcutaneous tissue and the clavipectoral fascia with cautery. Using the neoprobe for guidance I dissected down onto a slightly firm lymph node with high counts which was excised with cautery. Ex vivo this had counts of 675. The neoprobe identified one other area of elevated counts more medially against the chest wall and a small node was excised with ex vivo counts of 300. At this point background counts in the axilla were minimal and there was no palpable adenopathy. The deep axillary and subcutaneous tissue in this incision was closed with interrupted Vicryl and both skin incisions were closed with subcuticular Monocryl and Dermabond. Sponge needle and this may counts were correct. Dressing of ABDs and breast binder was placed.    Findings: As above  Estimated Blood Loss:  Minimal         Drains: None  Blood Given: none          Specimens: #1 left breast lumpectomy   #2 left axillary sentinel lymph nodes X 2        Complications:  * No complications entered in OR log *         Disposition: PACU - hemodynamically stable.  Condition: stable

## 2016-11-25 NOTE — Anesthesia Postprocedure Evaluation (Signed)
Anesthesia Post Note  Patient: Shelly Cervantes  Procedure(s) Performed: Procedure(s) (LRB): LEFT BREAST LUMPECTOMY WITH RADIOACTIVE SEED AND AXILLARY SENTINEL LYMPH NODE BIOPSY (Left)     Patient location during evaluation: PACU Anesthesia Type: Regional and General Level of consciousness: awake and alert Pain management: pain level controlled Vital Signs Assessment: post-procedure vital signs reviewed and stable Respiratory status: spontaneous breathing, nonlabored ventilation, respiratory function stable and patient connected to nasal cannula oxygen Cardiovascular status: blood pressure returned to baseline and stable Postop Assessment: no signs of nausea or vomiting Anesthetic complications: no    Last Vitals:  Vitals:   11/25/16 1045 11/25/16 1050  BP:  (!) 152/67  Pulse: 63 64  Resp: (!) 22 18  Temp: 36.6 C 36.6 C  SpO2: 99% 98%    Last Pain:  Vitals:   11/25/16 1050  TempSrc:   PainSc: 0-No pain                 Ryan P Ellender

## 2016-11-25 NOTE — Anesthesia Procedure Notes (Signed)
Anesthesia Regional Block: Pectoralis block   Pre-Anesthetic Checklist: ,, timeout performed, Correct Patient, Correct Site, Correct Laterality, Correct Procedure,, site marked, risks and benefits discussed, Surgical consent,  Pre-op evaluation,  At surgeon's request and post-op pain management  Laterality: Left  Prep: chloraprep       Needles:  Injection technique: Single-shot  Needle Type: Echogenic Stimulator Needle     Needle Length: 9cm  Needle Gauge: 21     Additional Needles:   Procedures: ultrasound guided,,,,,,,,  Narrative:  Start time: 11/25/2016 7:30 AM End time: 11/25/2016 7:40 AM Injection made incrementally with aspirations every 5 mL.  Performed by: Personally  Anesthesiologist: Adele Barthel P  Additional Notes: Functioning IV was confirmed and monitors were applied.  A 52mm 21ga Arrow echogenic stimulator needle was used. Sterile prep, hand hygiene and sterile gloves were used.  Negative aspiration and negative test dose prior to incremental administration of local anesthetic. The patient tolerated the procedure well.

## 2016-11-26 ENCOUNTER — Encounter (HOSPITAL_COMMUNITY): Payer: Self-pay | Admitting: General Surgery

## 2016-11-29 ENCOUNTER — Encounter: Payer: Medicare HMO | Admitting: Physical Therapy

## 2016-12-01 ENCOUNTER — Encounter: Payer: Medicare HMO | Admitting: Physical Therapy

## 2016-12-03 NOTE — Progress Notes (Addendum)
Location of Breast Cancer: upper-outer quadrant of left breast in female  Histology per Pathology Report:   11/25/16 Diagnosis 1. Breast, lumpectomy, Left - INVASIVE LOBULAR CARCINOMA, GRADE I/III, SPANNING 4.5 CM. - LOBULAR CARCINOMA IN SITU. Marland Kitchen - INVASIVE CARCINOMA IS BROADLY LESS THAN 0.1 CM TO THE ANTERIOR MARGIN, FOCALLY LESS THAN 0.1 CM TO THE INFERIOR MARGIN, AND FOCALLY LESS THAN 0.1 CM TO THE LATERAL MARGIN. - SEE ONCOLOGY TABLE BELOW. 2. Lymph node, sentinel, biopsy, Left Axillary - METASTATIC CARCINOMA IN 1 OF 1 LYMPH NODE (1/1). 3. Lymph node, sentinel, biopsy, Left Axillary - METASTATIC CARCINOMA IN 1 OF 1 LYMPH NODE (1/1).  10/10/16 Diagnosis Breast, left, needle core biopsy - INVASIVE MAMMARY CARCINOMA  Receptor Status: ER(90%), PR (0%), Her2-neu (insuffient), Ki-(10%)  Did patient present with symptoms (if so, please note symptoms) or was this found on screening mammography?: patient's PCP found a lump on exam  Past/Anticipated interventions by surgeon, if any: 11/25/16 - Procedure: LEFT BREAST LUMPECTOMY WITH RADIOACTIVE SEED AND AXILLARY SENTINEL LYMPH NODE BIOPSY;  Surgeon: Excell Seltzer, MD  Past/Anticipated interventions by medical oncology, if any: anastrozole started in July 2018.  Lymphedema issues, if any:  no    Pain issues, if any:  Yes rates pain in left breast at a 4/10.  Takes tramadol as needed.  SAFETY ISSUES:  Prior radiation? no  Pacemaker/ICD? no  Possible current pregnancy?no  Is the patient on methotrexate? no  Current Complaints / other details:  Patient is here with her husband and daughter.  Vitals were taken in Dr. [name] office today: bp 153/67, hr 80, temp 98.1, rr 18, O2 sat 100%.    Wt Readings from Last 3 Encounters:  12/08/16 126 lb 11.2 oz (57.5 kg)  11/25/16 127 lb 6.4 oz (57.8 kg)  11/15/16 127 lb 6.4 oz (57.8 kg)

## 2016-12-06 ENCOUNTER — Encounter: Payer: Medicare HMO | Admitting: Physical Therapy

## 2016-12-08 ENCOUNTER — Ambulatory Visit
Admission: RE | Admit: 2016-12-08 | Discharge: 2016-12-08 | Disposition: A | Discharge status: Home / Self Care | Payer: Medicare HMO | Source: Ambulatory Visit | Attending: Radiation Oncology | Admitting: Radiation Oncology

## 2016-12-08 ENCOUNTER — Ambulatory Visit (HOSPITAL_BASED_OUTPATIENT_CLINIC_OR_DEPARTMENT_OTHER): Payer: Medicare HMO | Admitting: Oncology

## 2016-12-08 ENCOUNTER — Encounter: Payer: Medicare HMO | Admitting: Physical Therapy

## 2016-12-08 ENCOUNTER — Encounter: Payer: Self-pay | Admitting: Radiation Oncology

## 2016-12-08 ENCOUNTER — Telehealth: Payer: Self-pay | Admitting: Oncology

## 2016-12-08 DIAGNOSIS — Z170 Estrogen receptor positive status [ER+]: Principal | ICD-10-CM

## 2016-12-08 DIAGNOSIS — C50412 Malignant neoplasm of upper-outer quadrant of left female breast: Secondary | ICD-10-CM

## 2016-12-08 DIAGNOSIS — F0280 Dementia in other diseases classified elsewhere without behavioral disturbance: Secondary | ICD-10-CM | POA: Diagnosis not present

## 2016-12-08 DIAGNOSIS — I10 Essential (primary) hypertension: Secondary | ICD-10-CM | POA: Diagnosis not present

## 2016-12-08 DIAGNOSIS — R69 Illness, unspecified: Secondary | ICD-10-CM | POA: Diagnosis not present

## 2016-12-08 DIAGNOSIS — Z9889 Other specified postprocedural states: Secondary | ICD-10-CM | POA: Diagnosis not present

## 2016-12-08 DIAGNOSIS — F329 Major depressive disorder, single episode, unspecified: Secondary | ICD-10-CM | POA: Diagnosis not present

## 2016-12-08 DIAGNOSIS — Z801 Family history of malignant neoplasm of trachea, bronchus and lung: Secondary | ICD-10-CM | POA: Diagnosis not present

## 2016-12-08 DIAGNOSIS — M8580 Other specified disorders of bone density and structure, unspecified site: Secondary | ICD-10-CM

## 2016-12-08 DIAGNOSIS — F419 Anxiety disorder, unspecified: Secondary | ICD-10-CM | POA: Diagnosis not present

## 2016-12-08 DIAGNOSIS — Z7982 Long term (current) use of aspirin: Secondary | ICD-10-CM | POA: Diagnosis not present

## 2016-12-08 DIAGNOSIS — E7800 Pure hypercholesterolemia, unspecified: Secondary | ICD-10-CM | POA: Diagnosis not present

## 2016-12-08 DIAGNOSIS — G309 Alzheimer's disease, unspecified: Secondary | ICD-10-CM | POA: Diagnosis not present

## 2016-12-08 HISTORY — DX: Malignant neoplasm of unspecified site of unspecified female breast: C50.919

## 2016-12-08 NOTE — Progress Notes (Signed)
Radiation Oncology         [phone] ________________________________  Name: Shelly Cervantes MRN: [id number]  Date: 12/08/2016  DOB: February 03, 1936  Re-evaluation  Note  CC: [name], MD  [name], [name], MD    ICD-10-CM   1. Malignant neoplasm of upper-outer quadrant of left breast in female, estrogen receptor positive ([place]) C50.412    Z17.0     Diagnosis:   Stage IIB (pT2, pN1c, cM0, G1, ER: Positive, PR: Negative, HER2: Negative) invasive lobular carcinoma presenting in the left breast  Narrative:  The patient returns today for further evaluation. The patient was initially seen in the multidisciplinary breast clinic. Since that evaluation the patient has completed her definitive surgery on 11/25/2016 the patient underwent a left lumpectomy and sentinel node procedure. The patient was found to have a 4.5 cm invasive lobular carcinoma within the left upper breast area. The surgical margins were clear but broadly less than 0.1 cm from the anterior margin and focally less than 0.1 cm from the inferior margin and focally less than 0.1 cm from the lateral margin. Earlier today the patient met with Dr. Jana Hakim and he recommended adjuvant hormonal therapy. Patient was not felt to be a potential candidate for adjuvant chemotherapy given her progressive dementia.  The patient and her family now return to discuss the role that radiation may play in her overall management.  Patient has had some pain in the left breast but is not requiring significant pain medication. Patient's husband felt the axillary scar had opened up and she was seen by Dr. Excell Seltzer who felt everything was healing appropriately without signs of infection or wound breakdown.                         ALLERGIES:  is allergic to codeine.  Meds: Current Outpatient Prescriptions  Medication Sig Dispense Refill  . anastrozole (ARIMIDEX) 1 MG tablet Take 1 tablet (1 mg total) by mouth daily. 90 tablet 4  . aspirin 81 MG  tablet Take 81 mg by mouth daily.    Marland Kitchen buPROPion (WELLBUTRIN XL) 300 MG 24 hr tablet Take 300 mg by mouth every morning.  12  . busPIRone (BUSPAR) 15 MG tablet Take 7.5-15 mg by mouth 2 (two) times daily. Takes 1 tablet in am and 0.5 tablet  at bedtime    . donepezil (ARICEPT) 10 MG tablet Take 1 tablet (10 mg total) by mouth at bedtime. (Patient taking differently: Take 10 mg by mouth daily. ) 90 tablet 3  . latanoprost (XALATAN) 0.005 % ophthalmic solution Place 1 drop into both eyes at bedtime.    Marland Kitchen lisinopril (PRINIVIL,ZESTRIL) 20 MG tablet Take 20 mg by mouth daily.  12  . memantine (NAMENDA) 10 MG tablet Take 10 mg by mouth 2 (two) times daily.    . Multiple Vitamin (MULTI-VITAMIN DAILY PO) Take 1 tablet by mouth daily.     Marland Kitchen OVER THE COUNTER MEDICATION aller -fld nasal spray  At night    . traMADol (ULTRAM) 50 MG tablet Take 1 tablet (50 mg total) by mouth every 6 (six) hours as needed. 10 tablet 1  . traZODone (DESYREL) 50 MG tablet Take 50 mg by mouth at bedtime.  12   No current facility-administered medications for this encounter.     Physical Findings: The patient is in no acute distress. She is pleasantly demented. No palpable supraclavicular or axillary adenopathy. The lungs are clear to auscultation. The heart has  a regular rhythm and rate. Examination of the right breast reveals no mass or nipple discharge. Examination of the left breast reveals a well-healing scar in the upper central breast area. The patient has a separate scar in the axillary region which is separated somewhat but no signs of drainage or infection.    Lab Findings: Lab Results  Component Value Date   WBC 4.5 11/15/2016   HGB 12.9 11/15/2016   HCT 38.9 11/15/2016   MCV 91.7 11/15/2016   PLT 158 11/15/2016    Radiographic Findings: No results found.  Impression:  Stage IIB (pT2, pN1c, cM0, G1, ER: Positive, PR: Negative, HER2: Negative) invasive lobular carcinoma presenting in the left breast. At the  time of surgery the patient was found to have a large tumor with surgical margins are extremely close in 3 directions. As above the patient has lymph node metastasis in 2 out of 2 nodes recovered. The patient would be at high risk for local regional recurrence given this situation and I would recommend adjuvant radiation therapy encompassing the left breast and axillary region. I discussed the course of treatment side effects and potential toxicities with the patient and her family. Patient's family is quite concerned that given her dementia she would not be able to tolerate the treatments well with significant fatigue. At this time the patient is taking 2 naps morning and afternoon as well as sleeping through the night in light of her generalized fatigue. The difficulty in deciding whether to do the treatment hinges on how rapid the patient's Alzheimer's will progress.  Plan:  The patient and her family will discuss whether to proceed with radiation therapy over the next several days and inform me of their  decision within the next week. If they proceed with adjuvant radiation therapy then I anticipate her radiation therapy therapy starting the first week in October. She would receive approximately 6-1/2 weeks of radiation therapy.  ____________________________________ Gery Pray, MD

## 2016-12-08 NOTE — Telephone Encounter (Signed)
Called patient's husband to see if they would like to come in earlier today for their appointment with Dr. Sondra Come.  [name] said Shelly Cervantes is not feeling well today and that they have an appointment with Dr. Jana Hakim at 2:30 so they are not able to come in earlier.

## 2016-12-08 NOTE — Progress Notes (Signed)
[place]  Telephone:(336) [phone] Fax:(336) [phone]     ID: Shelly Cervantes DOB: Aug 15, 1935  MR#: [id number]  JKK#:[id number]  Patient Care Team: [name], MD as PCP - General (Family Medicine) Excell Seltzer, MD as Consulting Physician (General Surgery) [name], [name], MD as Consulting Physician (Oncology) Gery Pray, MD as Consulting Physician (Radiation Oncology) Melvenia Beam, MD as Consulting Physician (Neurology) Chauncey Cruel, MD OTHER MD:  CHIEF COMPLAINT: Estrogen receptor positive lobular breast cancer  CURRENT TREATMENT:    BREAST CANCER HISTORY: From the original intake note:  The patient's primary care physician Dr. Harrington Challenger noted a change in the patient's left breast upper outer quadrant and set her up for bilateral diagnostic mammography with tomography and left breast ultrasonography at [facility] 10/08/2016. The breast density was category B. In the left breast upper outer quadrant there was a new 3 cm area of asymmetry with architectural distortion. Ultrasound measured this at 2.5 cm. There were no abnormalities in the left axilla by sonography.  On 10/11/2016 the patient underwent core biopsy of the left breast mass in question, and this showed ([id number]) invasive lobular carcinoma, E-cadherin negative, grade 2, estrogen receptor 90% positive with strong staining intensity but progesterone receptor negative. MIB-1 was 10% and there was insufficient invasive tumor for HER-2 evaluation.  The patient's subsequent history is as detailed below.  INTERVAL HISTORY: Kayleana returns today for further evaluation and treatment of her estrogen receptor positive lobular breast cancer accompanied by her husband and HER-2 daughters.. Since her last visit here she underwent left lumpectomy and sentinel lymph node sampling, on 11/25/2016. This showed (SZA [phone]) and invasive lobular carcinoma, grade 1, measuring 4.5 cm. Both sentinel lymph  nodes were involved.  Her case was presented at the multidisciplinary breast conference earlier today. Because the patient would have met criteria for the Z-11 study, it was felt that we could forgo full axillary dissection, proceed to radiation and anti-estrogens. She is here to discuss that and also to consider adjuvant chemotherapy since she did have spread to lymph nodes  REVIEW OF SYSTEMS: She is having some postoperative pain and taking Norco for this. It is not constipating her. She had some difficulty today recalling what she had yesterday for some per or what she did yesterday. She is generally vague in her answers to direct questions and defers to her family. Aside from these issues a detailed review of systems today was stable   PAST MEDICAL HISTORY: Past Medical History:  Diagnosis Date  . Alzheimer's disease   . Anxiety   . Breast cancer ([place])    left  . Depression   . GERD (gastroesophageal reflux disease)    occ  . High cholesterol   . Hypertension     PAST SURGICAL HISTORY: Past Surgical History:  Procedure Laterality Date  . BREAST LUMPECTOMY WITH RADIOACTIVE SEED AND SENTINEL LYMPH NODE BIOPSY Left 11/25/2016   Procedure: LEFT BREAST LUMPECTOMY WITH RADIOACTIVE SEED AND AXILLARY SENTINEL LYMPH NODE BIOPSY;  Surgeon: Excell Seltzer, MD;  Location: [place];  Service: General;  Laterality: Left;  . EYE SURGERY Bilateral    cataracts    FAMILY HISTORY Family History  Problem Relation Age of Onset  . Colon cancer Mother   . Cancer Father   . Lung cancer Father   . Dementia Sister   The patient's father died at age 68 from lung cancer. The patient's mother died at age 47. The patient had 2 brothers, 1 sister.  There is no history of breast or ovarian cancer in the family.  GYNECOLOGIC HISTORY:  No LMP recorded. Patient is postmenopausal. Menarche age 45, first live birth age 71, she is [place] P2. She stopped having periods around June 2000. She never took hormone  replacement. She used oral contraceptives for approximately 9 years remotely with no complications.  SOCIAL HISTORY:  She is retired. She has 2 children from her first marriage, [name] who is a Marine scientist working as a Occupational hygienist at [employer], and [employer], who also lives in [place] and works in Engineer, technical sales for [employer]. The patient has 3 biologic grandchildren and one great-grandchild. Her husband [name] has 2 children from his first marriage, a daughter in [place] and his son in [place]. The patient attends a friend's church    ADVANCED DIRECTIVES: In place HEALTH MAINTENANCE: Social History  Substance Use Topics  . Smoking status: Never Smoker  . Smokeless tobacco: Never Used  . Alcohol use 1.8 oz/week    3 Glasses of wine per week     Comment: 1-2 glasses of wine 2x/week     Colonoscopy:2007  PAP:  Bone density: Never   Allergies  Allergen Reactions  . Codeine Rash    Current Outpatient Prescriptions  Medication Sig Dispense Refill  . anastrozole (ARIMIDEX) 1 MG tablet Take 1 tablet (1 mg total) by mouth daily. 90 tablet 4  . aspirin 81 MG tablet Take 81 mg by mouth daily.    Marland Kitchen buPROPion (WELLBUTRIN XL) 300 MG 24 hr tablet Take 300 mg by mouth every morning.  12  . busPIRone (BUSPAR) 15 MG tablet Take 7.5-15 mg by mouth 2 (two) times daily. Takes 1 tablet in am and 0.5 tablet  at bedtime    . donepezil (ARICEPT) 10 MG tablet Take 1 tablet (10 mg total) by mouth at bedtime. (Patient taking differently: Take 10 mg by mouth daily. ) 90 tablet 3  . latanoprost (XALATAN) 0.005 % ophthalmic solution Place 1 drop into both eyes at bedtime.    Marland Kitchen lisinopril (PRINIVIL,ZESTRIL) 20 MG tablet Take 20 mg by mouth daily.  12  . memantine (NAMENDA) 10 MG tablet Take 10 mg by mouth 2 (two) times daily.    . Multiple Vitamin (MULTI-VITAMIN DAILY PO) Take 1 tablet by mouth daily.     Marland Kitchen OVER THE COUNTER MEDICATION aller -fld nasal spray  At night    . traMADol (ULTRAM)  50 MG tablet Take 1 tablet (50 mg total) by mouth every 6 (six) hours as needed. 10 tablet 1  . traZODone (DESYREL) 50 MG tablet Take 50 mg by mouth at bedtime.  12   No current facility-administered medications for this visit.     OBJECTIVE:Elderly white woman In no acute distress  Vitals:   12/08/16 1416  BP: (!) 153/67  Pulse: 80  Resp: 18  Temp: 98.1 F (36.7 C)  SpO2: 100%     Body mass index is 21.41 kg/m.  @.WEIGHTLAST3@    ECOG FS:1 - Symptomatic but completely ambulatory  Sclerae unicteric, pupils round and equal Oropharynx clear and moist No cervical or supraclavicular adenopathy Lungs no rales or rhonchi Heart regular rate and rhythm Abd soft, nontender, positive bowel sounds MSK no focal spinal tenderness, no upper extremity lymphedema Neuro: nonfocal, not confused but not well oriented with poor short-term memory, pleasant affect  Breasts: The right breast is benign. The left breast is status post recent lumpectomy. The incision is healing very nicely.  There is no evidence of dehiscence, swelling, or erythema. Both axillae are benign.   LAB RESULTS:  CMP     Component Value Date/Time   NA 141 11/15/2016 1417   NA 140 10/20/2016 1309   K 3.7 11/15/2016 1417   K 4.0 10/20/2016 1309   CL 106 11/15/2016 1417   CO2 26 11/15/2016 1417   CO2 28 10/20/2016 1309   GLUCOSE 107 (H) 11/15/2016 1417   GLUCOSE 89 10/20/2016 1309   BUN 11 11/15/2016 1417   BUN 16.6 10/20/2016 1309   CREATININE 1.02 (H) 11/15/2016 1417   CREATININE 1.2 (H) 10/20/2016 1309   CALCIUM 9.7 11/15/2016 1417   CALCIUM 10.2 10/20/2016 1309   PROT 7.2 10/20/2016 1309   ALBUMIN 4.0 10/20/2016 1309   AST 23 10/20/2016 1309   ALT 23 10/20/2016 1309   ALKPHOS 67 10/20/2016 1309   BILITOT 0.42 10/20/2016 1309   GFRNONAA 51 (L) 11/15/2016 1417   GFRAA 59 (L) 11/15/2016 1417    No results found for: TOTALPROTELP, ALBUMINELP, A1GS, A2GS, BETS, BETA2SER, GAMS, MSPIKE, SPEI  No results found  for: Nils Pyle, [facility]  Lab Results  Component Value Date   WBC 4.5 11/15/2016   NEUTROABS 2.1 10/20/2016   HGB 12.9 11/15/2016   HCT 38.9 11/15/2016   MCV 91.7 11/15/2016   PLT 158 11/15/2016      Chemistry      Component Value Date/Time   NA 141 11/15/2016 1417   NA 140 10/20/2016 1309   K 3.7 11/15/2016 1417   K 4.0 10/20/2016 1309   CL 106 11/15/2016 1417   CO2 26 11/15/2016 1417   CO2 28 10/20/2016 1309   BUN 11 11/15/2016 1417   BUN 16.6 10/20/2016 1309   CREATININE 1.02 (H) 11/15/2016 1417   CREATININE 1.2 (H) 10/20/2016 1309      Component Value Date/Time   CALCIUM 9.7 11/15/2016 1417   CALCIUM 10.2 10/20/2016 1309   ALKPHOS 67 10/20/2016 1309   AST 23 10/20/2016 1309   ALT 23 10/20/2016 1309   BILITOT 0.42 10/20/2016 1309       No results found for: LABCA2  No components found for: HFGBMS111  No results for input(s): INR in the last 168 hours.  Urinalysis No results found for: COLORURINE, APPEARANCEUR, LABSPEC, PHURINE, GLUCOSEU, HGBUR, BILIRUBINUR, KETONESUR, PROTEINUR, UROBILINOGEN, NITRITE, LEUKOCYTESUR   STUDIES: Pathology report discussed in detail with the patient who received a copy  ELIGIBLE FOR AVAILABLE RESEARCH PROTOCOL: no  ASSESSMENT: 81 y[place]o. [place], [place] womanStatus post left breast upper outer quadrant biopsy 10/11/2016 for a clinical T2 N0 invasive lobular carcinoma, E-cadherin negative, grade 2, 10 receptor strongly positive, progesterone receptor negative, with an MIB-1 of 10%. There was insufficient tissue for HER-2 evaluation.  (1) anastrozole started 10/20/2016  (2) status post left lumpectomy and sentinel lymph node sampling 11/25/2016 for a pT2 pN1, stage IIB invasive lobular carcinoma, grade 1, with close but negative margins.  (3) the patient opted against Mammaprint and adjuvant chemotherapy  (4) adjuvant radiation to follow  PLAN: I spent approximately 45 minutes with the patient, her husband,  and the patient's 2 daughters. We reviewed the pathology in detail. They understand lobular breast cancer can be difficult to detect and in fact the MRI showed no evidence of lymph node involvement. It turned out both the lymph nodes sampled were involved by tumor.  The possibility of a completion axillary dissection was discussed today. It was felt since the patient did meet criteria for  Z-11, completion axillary dissection could be avoided. The point of course is to save the patient the morbidity of significant lymphedema, which would be much more likely with a full dissection. This does mean the patient will need adjuvant radiation and that the adjuvant radiation will need to be modified slightly to cover more of the armpit.  This is important because the family questioned whether the patient should undergo radiation. I strongly urged them to agree to radiation and I described the effect of uncontrolled local disease, which can be devastating.  We then discussed the possibility of chemotherapy. I asked the patient and her family if I told them she had a 10% decrease in risk of recurrence by taking 3 months of chemotherapy would they agree and they were very clear that they would not think that a sufficient motivation for chemotherapy. I offered to send the Mammaprint in any case so there would have the actual information whether that risk reduction might or might not be available but they did not see any reason to send that test and so we are not requesting a Mammaprint on her.  She will meet with radiation oncology later today. She will continue on anastrozole, which she is tolerating remarkably well. She will return to see me in approximately 2 months and we will obtain a bone density scan prior to that visit.  They know to call for any problems that may develop before then.      Lanier Prude, MD   12/08/2016 6:32 PM Medical Oncology and Hematology [facility] [address] [place], [place] [address] Tel. [phone]    Fax. [phone]

## 2016-12-08 NOTE — Progress Notes (Signed)
Please see the Nurse Progress Note in the MD Initial Consult Encounter for this patient. 

## 2016-12-09 ENCOUNTER — Telehealth: Payer: Self-pay | Admitting: Oncology

## 2016-12-09 NOTE — Telephone Encounter (Signed)
Spoke with Mickel Baas at [facility] and scheduled her DEXA scan.   Called and spoke with her husband regarding her scan and her appt with Mendel Ryder on 10/29.   Dr.[name] was booked.

## 2016-12-17 ENCOUNTER — Telehealth: Payer: Self-pay | Admitting: Oncology

## 2016-12-17 NOTE — Telephone Encounter (Signed)
Called [name] (patient's husband) and asked if they have made a decision about radiation treatments.  He said that Lynden has an appointment with Dr. Excell Seltzer today and that they should make a decision within the next week.

## 2016-12-23 ENCOUNTER — Telehealth: Payer: Self-pay | Admitting: Oncology

## 2016-12-23 NOTE — Telephone Encounter (Signed)
Left a message for patient's husband regarding radiation treatment.  Requested a return call.

## 2016-12-29 ENCOUNTER — Telehealth: Payer: Self-pay | Admitting: Oncology

## 2016-12-29 NOTE — Telephone Encounter (Signed)
Left a message for patient's husband regarding radiation treatment.  Requested a return call.

## 2017-01-05 DIAGNOSIS — M85852 Other specified disorders of bone density and structure, left thigh: Secondary | ICD-10-CM | POA: Diagnosis not present

## 2017-01-05 DIAGNOSIS — M810 Age-related osteoporosis without current pathological fracture: Secondary | ICD-10-CM | POA: Diagnosis not present

## 2017-01-07 DIAGNOSIS — R51 Headache: Secondary | ICD-10-CM | POA: Diagnosis not present

## 2017-01-07 DIAGNOSIS — J310 Chronic rhinitis: Secondary | ICD-10-CM | POA: Diagnosis not present

## 2017-01-07 DIAGNOSIS — J343 Hypertrophy of nasal turbinates: Secondary | ICD-10-CM | POA: Diagnosis not present

## 2017-01-07 DIAGNOSIS — J342 Deviated nasal septum: Secondary | ICD-10-CM | POA: Diagnosis not present

## 2017-01-28 DIAGNOSIS — J324 Chronic pansinusitis: Secondary | ICD-10-CM | POA: Diagnosis not present

## 2017-01-28 DIAGNOSIS — J310 Chronic rhinitis: Secondary | ICD-10-CM | POA: Diagnosis not present

## 2017-01-28 DIAGNOSIS — J343 Hypertrophy of nasal turbinates: Secondary | ICD-10-CM | POA: Diagnosis not present

## 2017-01-28 DIAGNOSIS — J342 Deviated nasal septum: Secondary | ICD-10-CM | POA: Diagnosis not present

## 2017-02-07 ENCOUNTER — Telehealth: Payer: Self-pay | Admitting: Adult Health

## 2017-02-07 ENCOUNTER — Other Ambulatory Visit (HOSPITAL_BASED_OUTPATIENT_CLINIC_OR_DEPARTMENT_OTHER): Payer: Medicare HMO

## 2017-02-07 ENCOUNTER — Ambulatory Visit (HOSPITAL_BASED_OUTPATIENT_CLINIC_OR_DEPARTMENT_OTHER): Payer: Medicare HMO | Admitting: Adult Health

## 2017-02-07 ENCOUNTER — Encounter: Payer: Self-pay | Admitting: Adult Health

## 2017-02-07 VITALS — BP 130/59 | HR 87 | Temp 97.9°F | Resp 17 | Ht 64.5 in | Wt 124.5 lb

## 2017-02-07 DIAGNOSIS — Z79811 Long term (current) use of aromatase inhibitors: Secondary | ICD-10-CM

## 2017-02-07 DIAGNOSIS — Z23 Encounter for immunization: Secondary | ICD-10-CM

## 2017-02-07 DIAGNOSIS — Z170 Estrogen receptor positive status [ER+]: Secondary | ICD-10-CM

## 2017-02-07 DIAGNOSIS — C50412 Malignant neoplasm of upper-outer quadrant of left female breast: Secondary | ICD-10-CM

## 2017-02-07 DIAGNOSIS — M8580 Other specified disorders of bone density and structure, unspecified site: Secondary | ICD-10-CM

## 2017-02-07 LAB — COMPREHENSIVE METABOLIC PANEL
ALBUMIN: 4.3 g/dL (ref 3.5–5.0)
ALT: 24 U/L (ref 0–55)
ANION GAP: 9 meq/L (ref 3–11)
AST: 23 U/L (ref 5–34)
Alkaline Phosphatase: 64 U/L (ref 40–150)
BILIRUBIN TOTAL: 0.45 mg/dL (ref 0.20–1.20)
BUN: 18.3 mg/dL (ref 7.0–26.0)
CALCIUM: 10.2 mg/dL (ref 8.4–10.4)
CO2: 26 mEq/L (ref 22–29)
Chloride: 104 mEq/L (ref 98–109)
Creatinine: 1.2 mg/dL — ABNORMAL HIGH (ref 0.6–1.1)
EGFR: 45 mL/min/{1.73_m2} — ABNORMAL LOW (ref >60)
Glucose: 98 mg/dl (ref 70–140)
Potassium: 4 mEq/L (ref 3.5–5.1)
Sodium: 139 mEq/L (ref 136–145)
TOTAL PROTEIN: 7.5 g/dL (ref 6.4–8.3)

## 2017-02-07 LAB — CBC WITH DIFFERENTIAL/PLATELET
BASO%: 1 % (ref 0.0–2.0)
Basophils Absolute: 0 10*3/uL (ref 0.0–0.1)
EOS%: 1.1 % (ref 0.0–7.0)
Eosinophils Absolute: 0.1 10*3/uL (ref 0.0–0.5)
HEMATOCRIT: 41.9 % (ref 34.8–46.6)
HEMOGLOBIN: 14.2 g/dL (ref 11.6–15.9)
LYMPH#: 1.6 10*3/uL (ref 0.9–3.3)
LYMPH%: 35.5 % (ref 14.0–49.7)
MCH: 30.9 pg (ref 25.1–34.0)
MCHC: 33.8 g/dL (ref 31.5–36.0)
MCV: 91.5 fL (ref 79.5–101.0)
MONO#: 0.4 10*3/uL (ref 0.1–0.9)
MONO%: 8.3 % (ref 0.0–14.0)
NEUT%: 54.1 % (ref 38.4–76.8)
NEUTROS ABS: 2.4 10*3/uL (ref 1.5–6.5)
PLATELETS: 172 10*3/uL (ref 145–400)
RBC: 4.58 10*6/uL (ref 3.70–5.45)
RDW: 13.1 % (ref 11.2–14.5)
WBC: 4.5 10*3/uL (ref 3.9–10.3)

## 2017-02-07 MED ORDER — INFLUENZA VAC SPLIT QUAD 0.5 ML IM SUSY
0.5000 mL | PREFILLED_SYRINGE | Freq: Once | INTRAMUSCULAR | Status: AC
  Administered 2017-02-07: 0.5 mL via INTRAMUSCULAR
  Filled 2017-02-07: qty 0.5

## 2017-02-07 NOTE — Patient Instructions (Signed)

## 2017-02-07 NOTE — Progress Notes (Signed)
[place]  Telephone:(336) [phone] Fax:(336) [phone]     ID: Shelly Cervantes DOB: 1935-06-05  MR#: [id number]  LMR#:[id number]  Patient Care Team: Shelly Cruel, MD as PCP - General (Family Medicine) Shelly Seltzer, MD as Consulting Physician (General Surgery) Shelly Cervantes, Shelly Dad, MD as Consulting Physician (Oncology) Shelly Pray, MD as Consulting Physician (Radiation Oncology) Shelly Beam, MD as Consulting Physician (Neurology) Shelly Dock, NP OTHER MD:  CHIEF COMPLAINT: Estrogen receptor positive lobular breast cancer  CURRENT TREATMENT: Adjuvant anti-estrogen therapy  BREAST CANCER HISTORY: From the original intake note:  The patient's primary care physician Dr. Harrington Cervantes noted a change in the patient's left breast upper outer quadrant and set her up for bilateral diagnostic mammography with tomography and left breast ultrasonography at [facility] 10/08/2016. The breast density was category B. In the left breast upper outer quadrant there was a new 3 cm area of asymmetry with architectural distortion. Ultrasound measured this at 2.5 cm. There were no abnormalities in the left axilla by sonography.  On 10/11/2016 the patient underwent core biopsy of the left breast mass in question, and this showed ([id number]) invasive lobular carcinoma, E-cadherin negative, grade 2, estrogen receptor 90% positive with strong staining intensity but progesterone receptor negative. MIB-1 was 10% and there was insufficient invasive tumor for HER-2 evaluation.  The patient's subsequent history is as detailed below.  INTERVAL HISTORY: Shelly Cervantes returns today for further evaluation and treatment of her estrogen receptor positive lobular breast cancer accompanied by her husband, and daughter Shelly Cervantes.  She is doing well today and is taking anastrozole without any difficulty. She has opted against chemotherapy and adjuvant radiation.    REVIEW OF SYSTEMS: Shelly Cervantes is doing well.  She  denies any joint aches, dryness, hot flashes.  She did undergo bone density in 12/2016 at [facility] that demonstrates osteoporosis.  She wants to know what to do about this.  Otherwise, a detailed ROS is non contributory.    PAST MEDICAL HISTORY: Past Medical History:  Diagnosis Date  . Alzheimer's disease   . Anxiety   . Breast cancer ([place])    left  . Depression   . GERD (gastroesophageal reflux disease)    occ  . High cholesterol   . Hypertension     PAST SURGICAL HISTORY: Past Surgical History:  Procedure Laterality Date  . BREAST LUMPECTOMY WITH RADIOACTIVE SEED AND SENTINEL LYMPH NODE BIOPSY Left 11/25/2016   Procedure: LEFT BREAST LUMPECTOMY WITH RADIOACTIVE SEED AND AXILLARY SENTINEL LYMPH NODE BIOPSY;  Surgeon: Shelly Seltzer, MD;  Location: [place];  Service: General;  Laterality: Left;  . EYE SURGERY Bilateral    cataracts    FAMILY HISTORY Family History  Problem Relation Age of Onset  . Colon cancer Mother   . Cancer Father   . Lung cancer Father   . Dementia Sister   The patient's father died at age 53 from lung cancer. The patient's mother died at age 5. The patient had 2 brothers, 1 sister. There is no history of breast or ovarian cancer in the family.  GYNECOLOGIC HISTORY:  No LMP recorded. Patient is postmenopausal. Menarche age 65, first live birth age 11, she is [place] P2. She stopped having periods around June 2000. She never took hormone replacement. She used oral contraceptives for approximately 9 years remotely with no complications.  SOCIAL HISTORY:  She is retired. She has 2 children from her first marriage, Shelly Cervantes who is a Marine scientist working as a Occupational hygienist at  [employer], who also lives in [place] and works in Engineer, technical sales for [employer]. The patient has 3 biologic grandchildren and one great-grandchild. Her husband Shelly Cervantes has 2 children from his first marriage, a daughter in [place] and his son in [place]. The patient  attends a friend's church    ADVANCED DIRECTIVES: In place HEALTH MAINTENANCE: Social History  Substance Use Topics  . Smoking status: Never Smoker  . Smokeless tobacco: Never Used  . Alcohol use 1.8 oz/week    3 Glasses of wine per week     Comment: 1-2 glasses of wine 2x/week     Colonoscopy:2007  PAP:  Bone density: Never   Allergies  Allergen Reactions  . Codeine Rash    Current Outpatient Prescriptions  Medication Sig Dispense Refill  . anastrozole (ARIMIDEX) 1 MG tablet Take 1 tablet (1 mg total) by mouth daily. 90 tablet 4  . aspirin 81 MG tablet Take 81 mg by mouth daily.    Marland Kitchen buPROPion (WELLBUTRIN XL) 300 MG 24 hr tablet Take 300 mg by mouth every morning.  12  . busPIRone (BUSPAR) 15 MG tablet Take 7.5-15 mg by mouth 2 (two) times daily. Takes 1 tablet in am and 0.5 tablet  at bedtime    . donepezil (ARICEPT) 10 MG tablet Take 1 tablet (10 mg total) by mouth at bedtime. (Patient taking differently: Take 10 mg by mouth daily. ) 90 tablet 3  . latanoprost (XALATAN) 0.005 % ophthalmic solution Place 1 drop into both eyes at bedtime.    Marland Kitchen lisinopril (PRINIVIL,ZESTRIL) 20 MG tablet Take 20 mg by mouth daily.  12  . memantine (NAMENDA) 10 MG tablet Take 10 mg by mouth 2 (two) times daily.    . Multiple Vitamin (MULTI-VITAMIN DAILY PO) Take 1 tablet by mouth daily.     Marland Kitchen OVER THE COUNTER MEDICATION aller -fld nasal spray  At night    . traMADol (ULTRAM) 50 MG tablet Take 1 tablet (50 mg total) by mouth every 6 (six) hours as needed. 10 tablet 1  . traZODone (DESYREL) 50 MG tablet Take 50 mg by mouth at bedtime.  12   Current Facility-Administered Medications  Medication Dose Route Frequency Provider Last Rate Last Dose  . Influenza vac split quadrivalent PF (FLUARIX) injection 0.5 mL  0.5 mL Intramuscular Once Missie Gehrig, Charlestine Massed, NP        OBJECTIVE:Elderly white woman In no acute distress  Vitals:   02/07/17 1329  BP: (!) 130/59  Pulse: 87  Resp: 17  Temp:  97.9 F (36.6 C)  SpO2: 100%     Body mass index is 21.04 kg/m.  @.WEIGHTLAST3@    ECOG FS:1 - Symptomatic but completely ambulatory GENERAL: Patient is a well appearing female in no acute distress HEENT:  Sclerae anicteric.  Oropharynx clear and moist. No ulcerations or evidence of oropharyngeal candidiasis. Neck is supple.  NODES:  No cervical, supraclavicular, or axillary lymphadenopathy palpated.  BREAST EXAM:  Left lumpectomy healing well, mod amt of scar tissue present (unchanged per patient), left breast without nodules, masses, skin/nipple changes, right breast without nodules, masses, skin or nipple changes LUNGS:  Clear to auscultation bilaterally.  No wheezes or rhonchi. HEART:  Regular rate and rhythm. No murmur appreciated. ABDOMEN:  Soft, nontender.  Positive, normoactive bowel sounds. No organomegaly palpated. MSK:  No focal spinal tenderness to palpation. Full range of motion bilaterally in the upper extremities. EXTREMITIES:  No peripheral edema.   SKIN:  Clear with no obvious rashes or skin changes. No nail dyscrasia. NEURO:  Nonfocal. Well oriented.  Appropriate affect.     LAB RESULTS:  CMP     Component Value Date/Time   NA 139 02/07/2017 1301   K 4.0 02/07/2017 1301   CL 106 11/15/2016 1417   CO2 26 02/07/2017 1301   GLUCOSE 98 02/07/2017 1301   BUN 18.3 02/07/2017 1301   CREATININE 1.2 (H) 02/07/2017 1301   CALCIUM 10.2 02/07/2017 1301   PROT 7.5 02/07/2017 1301   ALBUMIN 4.3 02/07/2017 1301   AST 23 02/07/2017 1301   ALT 24 02/07/2017 1301   ALKPHOS 64 02/07/2017 1301   BILITOT 0.45 02/07/2017 1301   GFRNONAA 51 (L) 11/15/2016 1417   GFRAA 59 (L) 11/15/2016 1417    No results found for: TOTALPROTELP, ALBUMINELP, A1GS, A2GS, BETS, BETA2SER, GAMS, MSPIKE, SPEI  No results found for: Nils Pyle, [facility]  Lab Results  Component Value Date   WBC 4.5 02/07/2017   NEUTROABS 2.4 02/07/2017   HGB 14.2 02/07/2017   HCT 41.9  02/07/2017   MCV 91.5 02/07/2017   PLT 172 02/07/2017      Chemistry      Component Value Date/Time   NA 139 02/07/2017 1301   K 4.0 02/07/2017 1301   CL 106 11/15/2016 1417   CO2 26 02/07/2017 1301   BUN 18.3 02/07/2017 1301   CREATININE 1.2 (H) 02/07/2017 1301      Component Value Date/Time   CALCIUM 10.2 02/07/2017 1301   ALKPHOS 64 02/07/2017 1301   AST 23 02/07/2017 1301   ALT 24 02/07/2017 1301   BILITOT 0.45 02/07/2017 1301       No results found for: LABCA2  No components found for: SELTRV202  No results for input(s): INR in the last 168 hours.  Urinalysis No results found for: COLORURINE, APPEARANCEUR, LABSPEC, PHURINE, GLUCOSEU, HGBUR, BILIRUBINUR, KETONESUR, PROTEINUR, UROBILINOGEN, NITRITE, LEUKOCYTESUR   STUDIES: Pathology report discussed in detail with the patient who received a copy  ELIGIBLE FOR AVAILABLE RESEARCH PROTOCOL: no  ASSESSMENT: 81 [place] womanStatus post left breast upper outer quadrant biopsy 10/11/2016 for a clinical T2 N0 invasive lobular carcinoma, E-cadherin negative, grade 2, 10 receptor strongly positive, progesterone receptor negative, with an MIB-1 of 10%. There was insufficient tissue for HER-2 evaluation.  (1) anastrozole started 10/20/2016  (a) Bone density on 01/05/2017 consistent with osteoporosis with a T score of -3.0 in the right femoral neck.  (2) status post left lumpectomy and sentinel lymph node sampling 11/25/2016 for a pT2 pN1, stage IIB invasive lobular carcinoma, grade 1, with close but negative margins.  (3) the patient opted against Mammaprint and adjuvant chemotherapy  (4) declined adjuvant radiation  PLAN: Janay is doing well and continues to tolerate the Anastrozole with minimal difficulty.  We reviewed the fact that she has osteoporosis.  We reviewed the fact that due to this she is a candidate to start bisphosphanate therapy such as Prolia.  I reviewed risks and benefits in detail. I reviewed  that she will require dental clearance.  We discussed rare risk of osteonecrosis of the jaw.  I also gave her detailed info in her AVS.  She and her family would like to think about it, and take her into see her dentist and review with then.  I reviewed it is an every 6 months injection.  I also reviewed that in the meantime she should work on calcium, vitamin d intake along with weight bearing  exercises.  She will return for follow up in 6 months for labs, and eval by Dr. Jana Hakim.  I encouraged her to continue to f/u with Dr. Excell Cervantes as recommended.    They know to call for any problems that may develop before then.  A total of (30) minutes of face-to-face time was spent with this patient with greater than 50% of that time in counseling and care-coordination.    Shelly Dock, NP   02/07/2017 1:41 PM Medical Oncology and Hematology [facility] [address] [place] [address] [place], [place] [address] Tel. [phone]    Fax. [phone]

## 2017-02-07 NOTE — Telephone Encounter (Signed)
Scheduled appt per 10/29 los - Gave patient AVS and calender per los.  

## 2017-02-08 DIAGNOSIS — H40053 Ocular hypertension, bilateral: Secondary | ICD-10-CM | POA: Diagnosis not present

## 2017-02-08 DIAGNOSIS — H401122 Primary open-angle glaucoma, left eye, moderate stage: Secondary | ICD-10-CM | POA: Diagnosis not present

## 2017-02-08 DIAGNOSIS — H401111 Primary open-angle glaucoma, right eye, mild stage: Secondary | ICD-10-CM | POA: Diagnosis not present

## 2017-03-08 DIAGNOSIS — J343 Hypertrophy of nasal turbinates: Secondary | ICD-10-CM | POA: Diagnosis not present

## 2017-03-08 DIAGNOSIS — J310 Chronic rhinitis: Secondary | ICD-10-CM | POA: Diagnosis not present

## 2017-03-08 DIAGNOSIS — J329 Chronic sinusitis, unspecified: Secondary | ICD-10-CM | POA: Diagnosis not present

## 2017-03-08 DIAGNOSIS — J342 Deviated nasal septum: Secondary | ICD-10-CM | POA: Diagnosis not present

## 2017-03-21 ENCOUNTER — Telehealth: Payer: Self-pay

## 2017-03-21 NOTE — Telephone Encounter (Signed)
I called pt, spoke to pt's husband, [name], per [facility]. I advised him that our office will be closed tomorrow 03/22/17. Pt's husband is agreeable to r/s pt's appt until 03/25/17 at 10:00am. Pt's husband verbalized understanding of new appt date and time.

## 2017-03-22 ENCOUNTER — Ambulatory Visit: Payer: Medicare HMO | Admitting: Neurology

## 2017-03-25 ENCOUNTER — Encounter: Payer: Self-pay | Admitting: Neurology

## 2017-03-25 ENCOUNTER — Ambulatory Visit: Payer: Medicare HMO | Admitting: Neurology

## 2017-03-25 VITALS — BP 125/76 | HR 90 | Ht 64.0 in | Wt 124.0 lb

## 2017-03-25 DIAGNOSIS — G301 Alzheimer's disease with late onset: Secondary | ICD-10-CM

## 2017-03-25 DIAGNOSIS — F0280 Dementia in other diseases classified elsewhere without behavioral disturbance: Secondary | ICD-10-CM | POA: Diagnosis not present

## 2017-03-25 DIAGNOSIS — R69 Illness, unspecified: Secondary | ICD-10-CM | POA: Diagnosis not present

## 2017-03-25 MED ORDER — DONEPEZIL HCL 10 MG PO TABS: 10.0000 mg | ORAL_TABLET | Freq: Every day | ORAL | 3 refills | Status: DC

## 2017-03-25 NOTE — Patient Instructions (Signed)
We will continue with your dual memory medications.  Try to exercise daily if you can. Try to hydrate well with water.  We will follow up in 6 months.

## 2017-03-25 NOTE — Progress Notes (Signed)
Subjective:    Patient ID: Shelly Cervantes is a 81 y.o. female.  HPI     Interim history:   Shelly Cervantes is a 81 year old right-handed woman with an underlying medical history of anxiety, depression, hyperlipidemia, hypertension, insomnia, low back pain, and glaucoma, who presents for follow-up consultation of her memory loss. The patient is accompanied by her husband today. I saw her on 09/20/2016, at which time she reported doing okay. She was on donepezil 5 mg in the evening and generic Namenda 10 mg twice a day. I suggested we increase this to 10 mg daily if tolerated. She may have had some GI side effects in the past on the 10 mg strength. We also talked about her neuropsychological evaluation with Dr. Bonita Quin in April 2018 with findings in keeping with mild to moderate dementia, most likely of the Alzheimer's type.  Today, 03/25/2017 (all dictated new, as well as above notes, some dictation done in note pad or Word, outside of chart, may appear as copied):   She reports doing okay, husband has noted a decline in her memory. She is tolerating her memory medications. Dx of L breast cancer in June/July 2018, had L lumpectomy for br cancer on 11/25/16. Decided against XRT.               The patient's allergies, current medications, family history, past medical history, past social history, past surgical history and problem list were reviewed and updated as appropriate.    Previously (copied from previous notes for reference):   I saw her on 06/17/2016 for memory loss. At the time, her MMSE was MMSE: 23/30, CDT: 4/4, AFT: 7/min. she was taking Namenda and Aricept and I suggested we switch her to [employer] for convenience. She was advised to be better hydrated with water and reduce and eliminate in fact her alcohol consumption. I referred her for cognitive testing.   She had neuropsychological testing on 07/26/2016 and a follow-up appointment with neuropsychology on 08/05/2016. I reviewed Dr.  Si Raider Heath's neuropsychological report:    <<Impressions: Mild to moderate dementia most likely secondary to Alzheimer's disease. Results of the current cognitive evaluation reveal several areas of impairment, including temporal orientation, verbal and non-verbal memory, semantic retrieval (i.e., semantic fluency and confrontation naming), mental flexibility and set-shifting, and abstract reasoning. Additionally, there is evidence that her cognitive deficits are interfering with her ability to manage complex tasks, such as managing finances, medications, driving and cooking. As such, diagnostic criteria for a dementia syndrome are met.    The patient's cognitive profile is suggestive of hippocampal consolidation dysfunction and medial-temporal lobe involvement. Alzheimer's disease is the most likely etiology, given her cognitive profile clinical features and disease course. Due to the level of impairment on testing and functional decline in daily life, she is considered in the mild to moderate stage of dementia. She is experiencing increased depression and anxiety which are considered secondary to her dementia as opposed to a primary psychiatric disorder.    Recommendations: Based on the findings of the present evaluation, the following recommendations are offered:   1. The patient is considered an appropriate candidate for cholinesterase inhibitor therapy. She should continue treatment with Aricept/Namenda. 2. The patient should continue to receive assistance with complex ADLs, including transportation, finances, medications, cooking and transportation. She is not driving.  3. The patient's family may wish to attend a local dementia caregiver support group and/or seek additional information from the Alzheimer's Association ([email or web address]).  4. The patient should continue to participate  in activities which provide mental stimulation, safe cardiovascular exercise, and social interaction. Listening to  music regularly is encouraged as this is something that she enjoys and is shown to improve mood/engagement in individuals with AD.  5. The patient's husband is most concerned about the patient's reduce interest in activities she used to enjoy, and she does demonstrate some depression and anxiety. She is already prescribed Wellbutrin and Buspirone. Consultation with a geriatric psychiatrist such as Dr. Norma Fredrickson may be helpful in determining if a change in medication needs to be made. >>    I met her in February 2014, at which time her MMSE was 27 out of 30, clock drawing was 4 out of 4. Of note, she did not show for a follow-up appointment which was scheduled for April 2014. She had a brain MRI without contrast on 06/26/2012 which showed mild perisylvian, subcortical mesial temporal atrophy, mild chronic small vessel ischemic disease.  She was started on donepezil in November 2015, has been on 5 mg once daily in the evening. She may have tried a higher dose per husband, but he reports that she had GI side effects. She has been on Namenda generic 10 mg once daily in the morning since September 2017. Unclear if this was escalated to 10 mg twice daily but she may have had side effects on a higher dose per husband. She was started on BuSpar in September 2017 as well for anxiety. She has been on Wellbutrin long-acting for over one year for her depression. Mood wise, she has been doing well, feels stable, per husband, she has done quite well with respect to depression and anxiety in the past 6 months. She no longer drives, she has not driven a car and over 18 months per husband and daughter. She has 2 daughters, 3 grandchildren and one great-grandchild. Bedtime is early, around 7 PM or 8 PM, often more closer to 7. Wake up time is around 6 AM. She does take a couple of naps during the day. She does not always drink enough water and does not exercise regularly. She drinks coffee 1 or 2 cups per morning and some  juice, maybe 1 glass per day and wine, 1 glass every other day or so. Her memory loss has worsened over time. She does not have a telltale history of dementia and her family, her older sister has memory loss, she is 89 years older. Per previous records: One brother died in his 86s from [place]. Mother lived to be 2 years old, father died of lung cancer at age 32. She is a nonsmoker.  Her Past Medical History Is Significant For: Past Medical History:  Diagnosis Date  . Alzheimer's disease   . Anxiety   . Breast cancer ([facility])    left  . Depression   . GERD (gastroesophageal reflux disease)    occ  . High cholesterol   . Hypertension     Her Past Surgical History Is Significant For: Past Surgical History:  Procedure Laterality Date  . BREAST LUMPECTOMY WITH RADIOACTIVE SEED AND SENTINEL LYMPH NODE BIOPSY Left 11/25/2016   Procedure: LEFT BREAST LUMPECTOMY WITH RADIOACTIVE SEED AND AXILLARY SENTINEL LYMPH NODE BIOPSY;  Surgeon: Excell Seltzer, MD;  Location: [facility];  Service: General;  Laterality: Left;  . EYE SURGERY Bilateral    cataracts    Her Family History Is Significant For: Family History  Problem Relation Age of Onset  . Colon cancer Mother   . Cancer Father   .  Lung cancer Father   . Dementia Sister     Her Social History Is Significant For: Social History   Socioeconomic History  . Marital status: Married    Spouse name: None  . Number of children: 2  . Years of education: 2  . Highest education level: None  Social Needs  . Financial resource strain: None  . Food insecurity - worry: None  . Food insecurity - inability: None  . Transportation needs - medical: None  . Transportation needs - non-medical: None  Occupational History  . Occupation: Retired   Tobacco Use  . Smoking status: Never Smoker  . Smokeless tobacco: Never Used  Substance and Sexual Activity  . Alcohol use: Yes    Alcohol/week: 1.8 oz    Types: 3 Glasses of wine per week    Comment: 1-2  glasses of wine 2x/week  . Drug use: No  . Sexual activity: None  Other Topics Concern  . None  Social History Narrative   Denies caffeine use     Her Allergies Are:  Allergies  Allergen Reactions  . Codeine Rash  :   Her Current Medications Are:  Outpatient Encounter Medications as of 03/25/2017  Medication Sig  . anastrozole (ARIMIDEX) 1 MG tablet Take 1 tablet (1 mg total) by mouth daily.  Marland Kitchen aspirin 81 MG tablet Take 81 mg by mouth daily.  Marland Kitchen buPROPion (WELLBUTRIN XL) 300 MG 24 hr tablet Take 300 mg by mouth every morning.  . busPIRone (BUSPAR) 15 MG tablet Take 7.5-15 mg by mouth 2 (two) times daily. Takes 1 tablet in am and 0.5 tablet  at bedtime  . donepezil (ARICEPT) 10 MG tablet Take 1 tablet (10 mg total) by mouth at bedtime. (Patient taking differently: Take 10 mg by mouth daily. )  . latanoprost (XALATAN) 0.005 % ophthalmic solution Place 1 drop into both eyes at bedtime.  Marland Kitchen lisinopril (PRINIVIL,ZESTRIL) 20 MG tablet Take 20 mg by mouth daily.  . memantine (NAMENDA) 10 MG tablet Take 10 mg by mouth 2 (two) times daily.  . Multiple Vitamin (MULTI-VITAMIN DAILY PO) Take 1 tablet by mouth daily.   Marland Kitchen OVER THE COUNTER MEDICATION aller -fld nasal spray  At night  . traMADol (ULTRAM) 50 MG tablet Take 1 tablet (50 mg total) by mouth every 6 (six) hours as needed.  . traZODone (DESYREL) 50 MG tablet Take 50 mg by mouth at bedtime.   No facility-administered encounter medications on file as of 03/25/2017.   :  Review of Systems:  Out of a complete 14 point review of systems, all are reviewed and negative with the exception of these symptoms as listed below:  Review of Systems  Neurological:       Pt presents today to discuss her memory. Pt's husband reports a decline in her memory.    Objective:  Neurological Exam  Physical Exam Physical Examination:   Vitals:   03/25/17 1026  BP: 125/76  Pulse: 90    General Examination: The patient is a very pleasant 81 y.o.  female in no acute distress. She appears well-developed and well-nourished and well groomed. Good spirits.  HEENT:Normocephalic, atraumatic, pupils are equal, round and reactive to light and accommodation. Extraocular tracking is good without limitation to gaze excursion or nystagmus noted. Normal smooth pursuit is noted. Hearing is grossly intact. Face is symmetric with normal facial animation and normal facial sensation. Speech is clear with no dysarthria noted. There is no hypophonia. There is no lip, neck/head, jaw  or voice tremor. Neck is supple with full range of passive and active motion. There are no carotid bruits on auscultation. Oropharynx exam reveals: mildmouth dryness, adequatedental hygiene and mildairway crowding, due to smaller airway entry and redundant soft palate. Mallampati is class II. Tongue protrudes centrally and palate elevates symmetrically.   Chest:Clear to auscultation without wheezing, rhonchi or crackles noted. No tenderness left chest area.   Heart:S1+S2+0, regular and normal without murmurs, rubs or gallops noted.   Abdomen:Soft, non-tender and non-distended with normal bowel sounds appreciated on auscultation.  Extremities:There is nopitting edema in the distal lower extremities bilaterally. Pedal pulses are intact.  Skin: Warm and dry without trophic changes noted.  Musculoskeletal: exam reveals no obvious joint deformities, tenderness or joint swelling or erythema.   Neurologically:  Mental status: The patient is awake, alert and oriented to place, self, circumstance. Herimmediate and remote memory, attention, language skills and fund of knowledge are impaired. Speech is clear with normal prosody and enunciation. Thought process is linear. Mood is normaland affect is good.    On 03/25/2017: MMSE: 20/30, CDT: 4/4, AFT: 8/min.  On 06/17/2016: MMSE: 23/30, CDT: 4/4, AFT: 7/min.   In Feb. 2014: MMSE was 27 out of 30, clock drawing was 4 out of  4.  Cranial nerves II - XII are as described above under HEENT exam. In addition: shoulder shrug is normal with equal shoulder height noted. Motor exam: Normal bulk, strength for age and tone is noted. There is no drift, tremor or rebound. Reflexes are 1+ throughout. Fine motor skills and coordination: grossly intact.  Cerebellar testing: No dysmetria or intention tremor. Sensory exam: intact to light touch in the upper and lower extremities.  Gait, station and balance: Shestands easily. No veering to one side is noted. No leaning to one side is noted. Posture is age-appropriate and stance is narrow based.Balance is mildly impaired, tandem walk is not tested for safety. She is wearing high heels.   Assessment and Plan:   In summary, Tyjae Shvartsman Huffis a very pleasant 81 year old female with an underlying medical history of anxiety, depression, hyperlipidemia, hypertension, insomnia, low back pain, and glaucoma, whopresents for follow-up consultation of her memory loss. Her history, physical examination, memory scores and brain MRI testing, as well as neuropsychological test results in April 2018 are supportive of the diagnosis of Alzheimers disease in the mild-to-moderate range. She has had a Hx of anxiety and depression which are under control on current medications. She has had some intermittent dizziness, but has a non-focal neurological exam. She is stable now. She could not afford Namzaric 28-10 milligrams strength, and is now on generic Namenda twice daily and donepezil 10 mg daily tolerating both medications. She has significantly reduced her alcohol intake and per husband has not had any except for a few occasions. She is doing better with her water hydration. She is advised to try to exercise on a regular basis. She was in the interim diagnosed with left-sided breast cancer and had a lumpectomy in August. She has recovered well from her surgery and decided to forego XRT. Overall,  she is doing reasonably well at this time. I would like for her to continue her medications, we will do a 6 month recheck with one of our nurse practitioners. I answered all their questions today and the patient and her husband were in agreement.  I spent 25 minutes in total face-to-face time with the patient, more than 50% of which was spent in counseling and coordination of  care, reviewing test results, reviewing medication and discussing or reviewing the diagnosis of dementia, its prognosis and treatment options. Pertinent laboratory and imaging test results that were available during this visit with the patient were reviewed by me and considered in my medical decision making (see chart for details).

## 2017-06-13 DIAGNOSIS — M25519 Pain in unspecified shoulder: Secondary | ICD-10-CM | POA: Diagnosis not present

## 2017-06-13 DIAGNOSIS — C50919 Malignant neoplasm of unspecified site of unspecified female breast: Secondary | ICD-10-CM | POA: Diagnosis not present

## 2017-06-13 DIAGNOSIS — F0390 Unspecified dementia without behavioral disturbance: Secondary | ICD-10-CM | POA: Diagnosis not present

## 2017-06-13 DIAGNOSIS — R69 Illness, unspecified: Secondary | ICD-10-CM | POA: Diagnosis not present

## 2017-06-13 DIAGNOSIS — F324 Major depressive disorder, single episode, in partial remission: Secondary | ICD-10-CM | POA: Diagnosis not present

## 2017-06-13 DIAGNOSIS — R1013 Epigastric pain: Secondary | ICD-10-CM | POA: Diagnosis not present

## 2017-06-21 DIAGNOSIS — C50919 Malignant neoplasm of unspecified site of unspecified female breast: Secondary | ICD-10-CM | POA: Diagnosis not present

## 2017-06-21 DIAGNOSIS — M25511 Pain in right shoulder: Secondary | ICD-10-CM | POA: Diagnosis not present

## 2017-06-21 DIAGNOSIS — R69 Illness, unspecified: Secondary | ICD-10-CM | POA: Diagnosis not present

## 2017-06-21 DIAGNOSIS — F419 Anxiety disorder, unspecified: Secondary | ICD-10-CM | POA: Diagnosis not present

## 2017-06-21 DIAGNOSIS — F324 Major depressive disorder, single episode, in partial remission: Secondary | ICD-10-CM | POA: Diagnosis not present

## 2017-06-24 DIAGNOSIS — C50912 Malignant neoplasm of unspecified site of left female breast: Secondary | ICD-10-CM | POA: Diagnosis not present

## 2017-06-28 DIAGNOSIS — M7541 Impingement syndrome of right shoulder: Secondary | ICD-10-CM | POA: Diagnosis not present

## 2017-06-28 DIAGNOSIS — M25511 Pain in right shoulder: Secondary | ICD-10-CM | POA: Diagnosis not present

## 2017-07-08 DIAGNOSIS — C50919 Malignant neoplasm of unspecified site of unspecified female breast: Secondary | ICD-10-CM | POA: Diagnosis not present

## 2017-07-08 DIAGNOSIS — F324 Major depressive disorder, single episode, in partial remission: Secondary | ICD-10-CM | POA: Diagnosis not present

## 2017-07-08 DIAGNOSIS — M25511 Pain in right shoulder: Secondary | ICD-10-CM | POA: Diagnosis not present

## 2017-07-08 DIAGNOSIS — R69 Illness, unspecified: Secondary | ICD-10-CM | POA: Diagnosis not present

## 2017-07-08 DIAGNOSIS — F419 Anxiety disorder, unspecified: Secondary | ICD-10-CM | POA: Diagnosis not present

## 2017-07-11 DIAGNOSIS — R69 Illness, unspecified: Secondary | ICD-10-CM | POA: Diagnosis not present

## 2017-07-11 DIAGNOSIS — M25511 Pain in right shoulder: Secondary | ICD-10-CM | POA: Diagnosis not present

## 2017-07-11 DIAGNOSIS — C50919 Malignant neoplasm of unspecified site of unspecified female breast: Secondary | ICD-10-CM | POA: Diagnosis not present

## 2017-07-11 DIAGNOSIS — F419 Anxiety disorder, unspecified: Secondary | ICD-10-CM | POA: Diagnosis not present

## 2017-07-11 DIAGNOSIS — F324 Major depressive disorder, single episode, in partial remission: Secondary | ICD-10-CM | POA: Diagnosis not present

## 2017-07-13 DIAGNOSIS — M25511 Pain in right shoulder: Secondary | ICD-10-CM | POA: Diagnosis not present

## 2017-07-13 DIAGNOSIS — F419 Anxiety disorder, unspecified: Secondary | ICD-10-CM | POA: Diagnosis not present

## 2017-07-13 DIAGNOSIS — F324 Major depressive disorder, single episode, in partial remission: Secondary | ICD-10-CM | POA: Diagnosis not present

## 2017-07-13 DIAGNOSIS — C50919 Malignant neoplasm of unspecified site of unspecified female breast: Secondary | ICD-10-CM | POA: Diagnosis not present

## 2017-07-13 DIAGNOSIS — R69 Illness, unspecified: Secondary | ICD-10-CM | POA: Diagnosis not present

## 2017-07-18 DIAGNOSIS — R69 Illness, unspecified: Secondary | ICD-10-CM | POA: Diagnosis not present

## 2017-07-18 DIAGNOSIS — F419 Anxiety disorder, unspecified: Secondary | ICD-10-CM | POA: Diagnosis not present

## 2017-07-18 DIAGNOSIS — F324 Major depressive disorder, single episode, in partial remission: Secondary | ICD-10-CM | POA: Diagnosis not present

## 2017-07-18 DIAGNOSIS — M25511 Pain in right shoulder: Secondary | ICD-10-CM | POA: Diagnosis not present

## 2017-07-18 DIAGNOSIS — C50919 Malignant neoplasm of unspecified site of unspecified female breast: Secondary | ICD-10-CM | POA: Diagnosis not present

## 2017-07-20 DIAGNOSIS — F419 Anxiety disorder, unspecified: Secondary | ICD-10-CM | POA: Diagnosis not present

## 2017-07-20 DIAGNOSIS — R69 Illness, unspecified: Secondary | ICD-10-CM | POA: Diagnosis not present

## 2017-07-20 DIAGNOSIS — M25511 Pain in right shoulder: Secondary | ICD-10-CM | POA: Diagnosis not present

## 2017-07-20 DIAGNOSIS — C50919 Malignant neoplasm of unspecified site of unspecified female breast: Secondary | ICD-10-CM | POA: Diagnosis not present

## 2017-07-20 DIAGNOSIS — F324 Major depressive disorder, single episode, in partial remission: Secondary | ICD-10-CM | POA: Diagnosis not present

## 2017-07-27 DIAGNOSIS — C50919 Malignant neoplasm of unspecified site of unspecified female breast: Secondary | ICD-10-CM | POA: Diagnosis not present

## 2017-07-27 DIAGNOSIS — F324 Major depressive disorder, single episode, in partial remission: Secondary | ICD-10-CM | POA: Diagnosis not present

## 2017-07-27 DIAGNOSIS — R69 Illness, unspecified: Secondary | ICD-10-CM | POA: Diagnosis not present

## 2017-07-27 DIAGNOSIS — M25511 Pain in right shoulder: Secondary | ICD-10-CM | POA: Diagnosis not present

## 2017-07-27 DIAGNOSIS — F419 Anxiety disorder, unspecified: Secondary | ICD-10-CM | POA: Diagnosis not present

## 2017-08-05 NOTE — Progress Notes (Signed)
[place]  Telephone:([phone]) [phone] Fax:(336) [phone]     ID: Shelly Cervantes DOB: Jan 11, 1936  MR#: [id number]  GHW#:[id number]  Patient Care Team: [name], MD as PCP - General (Family Medicine) Excell Seltzer, MD as Consulting Physician (General Surgery) [name], [name], MD as Consulting Physician (Oncology) Gery Pray, MD as Consulting Physician (Radiation Oncology) Melvenia Beam, MD as Consulting Physician (Neurology) OTHER MD:  CHIEF COMPLAINT: Estrogen receptor positive lobular breast cancer  CURRENT TREATMENT: Anastrozole  BREAST CANCER HISTORY: From the original intake note:  The patient's primary care physician Dr. Harrington Challenger noted a change in the patient's left breast upper outer quadrant and set her up for bilateral diagnostic mammography with tomography and left breast ultrasonography at [facility] 10/08/2016. The breast density was category B. In the left breast upper outer quadrant there was a new 3 cm area of asymmetry with architectural distortion. Ultrasound measured this at 2.5 cm. There were no abnormalities in the left axilla by sonography.  On 10/11/2016 the patient underwent core biopsy of the left breast mass in question, and this showed ([id number]) invasive lobular carcinoma, E-cadherin negative, grade 2, estrogen receptor 90% positive with strong staining intensity but progesterone receptor negative. MIB-1 was 10% and there was insufficient invasive tumor for HER-2 evaluation.  The patient's subsequent history is as detailed below.  INTERVAL HISTORY: Shelly Cervantes returns today for further evaluation and treatment of her estrogen receptor positive lobular breast cancer accompanied by her husband. She continues on anastrozole, with good tolerance. She denies hot flashes. Vaginal dryness is not a major issue for her.   REVIEW OF SYSTEMS: Shelly Cervantes reports that she takes Kiyona for allergies and sinus issues. She stays very active  throughout the day. She has moved to [place] living in [place]. So far, they like it. Unfortunately they do not have a gym or cafeteria. They waited about 2 years to get in. She denies unusual headaches, visual changes, nausea, vomiting, or dizziness. There has been no unusual cough, phlegm production, or pleurisy. This been no change in bowel or bladder habits. She denies unexplained fatigue or unexplained weight loss, bleeding, rash, or fever. A detailed review of systems was otherwise stable.    PAST MEDICAL HISTORY: Past Medical History:  Diagnosis Date  . Alzheimer's disease   . Anxiety   . Breast cancer ([place])    left  . Depression   . GERD (gastroesophageal reflux disease)    occ  . High cholesterol   . Hypertension     PAST SURGICAL HISTORY: Past Surgical History:  Procedure Laterality Date  . BREAST LUMPECTOMY WITH RADIOACTIVE SEED AND SENTINEL LYMPH NODE BIOPSY Left 11/25/2016   Procedure: LEFT BREAST LUMPECTOMY WITH RADIOACTIVE SEED AND AXILLARY SENTINEL LYMPH NODE BIOPSY;  Surgeon: Excell Seltzer, MD;  Location: [place];  Service: General;  Laterality: Left;  . EYE SURGERY Bilateral    cataracts    FAMILY HISTORY Family History  Problem Relation Age of Onset  . Colon cancer Mother   . Cancer Father   . Lung cancer Father   . Dementia Sister   The patient's father died at age 48 from lung cancer. The patient's mother died at age 16. The patient had 2 brothers, 1 sister. There is no history of breast or ovarian cancer in the family.  GYNECOLOGIC HISTORY:  No LMP recorded. Patient is postmenopausal. Menarche age 40, first live birth age 81, she is [place] P2. She stopped having periods  around June 2000. She never took hormone replacement. She used oral contraceptives for approximately 9 years remotely with no complications.  SOCIAL HISTORY:  She is retired. She has 2 children from her first marriage, Shelly Cervantes who is a Marine scientist working as a  Occupational hygienist at [employer], and [employer], who also lives in [place] and works in Engineer, technical sales for [employer]. The patient has 3 biologic grandchildren and one great-grandchild. Her husband [name] has 2 children from his first marriage, a daughter in [place] and his son in [place]. They also share 5 great grandchildren. The patient attends a friend's church    ADVANCED DIRECTIVES: In place HEALTH MAINTENANCE: Social History   Tobacco Use  . Smoking status: Never Smoker  . Smokeless tobacco: Never Used  Substance Use Topics  . Alcohol use: Yes    Alcohol/week: 1.8 oz    Types: 3 Glasses of wine per week    Comment: 1-2 glasses of wine 2x/week  . Drug use: No     Colonoscopy:2007  PAP:  Bone density: Never   Allergies  Allergen Reactions  . Codeine Rash    Current Outpatient Medications  Medication Sig Dispense Refill  . anastrozole (ARIMIDEX) 1 MG tablet Take 1 tablet (1 mg total) by mouth daily. 90 tablet 4  . aspirin 81 MG tablet Take 81 mg by mouth daily.    Marland Kitchen buPROPion (WELLBUTRIN XL) 300 MG 24 hr tablet Take 300 mg by mouth every morning.  12  . busPIRone (BUSPAR) 15 MG tablet Take 7.5-15 mg by mouth 2 (two) times daily. Takes 1 tablet in am and 0.5 tablet  at bedtime    . donepezil (ARICEPT) 10 MG tablet Take 1 tablet (10 mg total) by mouth daily. 90 tablet 3  . latanoprost (XALATAN) 0.005 % ophthalmic solution Place 1 drop into both eyes at bedtime.    Marland Kitchen lisinopril (PRINIVIL,ZESTRIL) 20 MG tablet Take 20 mg by mouth daily.  12  . memantine (NAMENDA) 10 MG tablet Take 10 mg by mouth 2 (two) times daily.    . Multiple Vitamin (MULTI-VITAMIN DAILY PO) Take 1 tablet by mouth daily.     . traMADol (ULTRAM) 50 MG tablet Take 1 tablet (50 mg total) by mouth every 6 (six) hours as needed. 10 tablet 1  . traZODone (DESYREL) 50 MG tablet Take 50 mg by mouth at bedtime.  12   No current facility-administered medications for this visit.     OBJECTIVE:Elderly  white woman who appears stated age  17:   08/08/17 1430  BP: 119/88  Pulse: 80  Resp: 18  Temp: 97.9 F (36.6 C)  SpO2: 100%     Body mass index is 20.32 kg/m.  @.WEIGHTLAST3@    ECOG FS:0 - Asymptomatic   Sclerae unicteric, EOMs intact  No cervical or supraclavicular adenopathy Lungs no rales or rhonchi Heart regular rate and rhythm Abd soft, nontender, positive bowel sounds MSK no focal spinal tenderness, no upper extremity lymphedema Neuro: nonfocal, well oriented, appropriate affect Breasts: The right breast is unremarkable.  The left breast is status post lumpectomy.  There is no evidence of local recurrence.  Both axillae are benign.  LAB RESULTS:  CMP     Component Value Date/Time   NA 141 08/08/2017 1417   NA 139 02/07/2017 1301   K 3.4 (L) 08/08/2017 1417   K 4.0 02/07/2017 1301   CL 105 08/08/2017 1417   CO2 27 08/08/2017 1417   CO2 26 02/07/2017  1301   GLUCOSE 103 08/08/2017 1417   GLUCOSE 98 02/07/2017 1301   BUN 13 08/08/2017 1417   BUN 18.3 02/07/2017 1301   CREATININE 0.99 08/08/2017 1417   CREATININE 1.2 (H) 02/07/2017 1301   CALCIUM 10.1 08/08/2017 1417   CALCIUM 10.2 02/07/2017 1301   PROT 6.9 08/08/2017 1417   PROT 7.5 02/07/2017 1301   ALBUMIN 4.2 08/08/2017 1417   ALBUMIN 4.3 02/07/2017 1301   AST 24 08/08/2017 1417   AST 23 02/07/2017 1301   ALT 24 08/08/2017 1417   ALT 24 02/07/2017 1301   ALKPHOS 71 08/08/2017 1417   ALKPHOS 64 02/07/2017 1301   BILITOT 0.3 08/08/2017 1417   BILITOT 0.45 02/07/2017 1301   GFRNONAA 52 (L) 08/08/2017 1417   GFRAA >60 08/08/2017 1417    No results found for: TOTALPROTELP, ALBUMINELP, A1GS, A2GS, BETS, BETA2SER, GAMS, MSPIKE, SPEI  No results found for: Nils Pyle, [facility]  Lab Results  Component Value Date   WBC 4.9 08/08/2017   NEUTROABS 2.9 08/08/2017   HGB 13.0 08/08/2017   HCT 39.6 08/08/2017   MCV 94.1 08/08/2017   PLT 166 08/08/2017      Chemistry        Component Value Date/Time   NA 141 08/08/2017 1417   NA 139 02/07/2017 1301   K 3.4 (L) 08/08/2017 1417   K 4.0 02/07/2017 1301   CL 105 08/08/2017 1417   CO2 27 08/08/2017 1417   CO2 26 02/07/2017 1301   BUN 13 08/08/2017 1417   BUN 18.3 02/07/2017 1301   CREATININE 0.99 08/08/2017 1417   CREATININE 1.2 (H) 02/07/2017 1301      Component Value Date/Time   CALCIUM 10.1 08/08/2017 1417   CALCIUM 10.2 02/07/2017 1301   ALKPHOS 71 08/08/2017 1417   ALKPHOS 64 02/07/2017 1301   AST 24 08/08/2017 1417   AST 23 02/07/2017 1301   ALT 24 08/08/2017 1417   ALT 24 02/07/2017 1301   BILITOT 0.3 08/08/2017 1417   BILITOT 0.45 02/07/2017 1301       No results found for: LABCA2  No components found for: [id number]  No results for input(s): INR in the last 168 hours.  Urinalysis No results found for: COLORURINE, APPEARANCEUR, LABSPEC, PHURINE, GLUCOSEU, HGBUR, BILIRUBINUR, KETONESUR, PROTEINUR, UROBILINOGEN, NITRITE, LEUKOCYTESUR   STUDIES: No results found.   ELIGIBLE FOR AVAILABLE RESEARCH PROTOCOL: no  ASSESSMENT: 82 y.o. [place] woman Status post left breast upper outer quadrant biopsy 10/11/2016 for a clinical T2 N0 invasive lobular carcinoma, E-cadherin negative, grade 2, 10 receptor strongly positive, progesterone receptor negative, with an MIB-1 of 10%. There was insufficient tissue for HER-2 evaluation.  (1) anastrozole started 10/20/2016  (a) Bone density on 01/05/2017 consistent with osteoporosis with a T score of -3.0 in the right femoral neck.  (b) offered denosumab/Prolia February 07, 2017--patient decided to wait  (2) status post left lumpectomy and sentinel lymph node sampling 11/25/2016 for a pT2 pN1, stage IIB invasive lobular carcinoma, grade 1, with close but negative margins.  (3) the patient opted against Mammaprint and adjuvant chemotherapy  (4) declined adjuvant radiation  PLAN: Latiqua is coming up on a year from her last mammogram in June and I  am setting her up for repeat mammography late June or early July.  She will be seeing her surgeon shortly after that.  Accordingly she will see me again next February.  She is tolerating anastrozole well and the plan will be to continue that a total of  5 years.  We again discussed osteoporosis issues and she understands the various possibilities.  However she just does not want to do anymore pills or infusions at this point.  We will continue to observe  She knows to call for any other issues that may develop before the next visit.   [name], [name], MD  08/08/17 2:54 PM Medical Oncology and Hematology [facility] [address] [place], [place] [address] Tel. [phone]    Fax. [phone]  This document serves as a record of services personally performed by Lurline Del, MD. It was created on his behalf by Sheron Nightingale, a trained medical scribe. The creation of this record is based on the scribe's personal observations and the provider's statements to them.   I have reviewed the above documentation for accuracy and completeness, and I agree with the above.

## 2017-08-08 ENCOUNTER — Telehealth: Payer: Self-pay | Admitting: Oncology

## 2017-08-08 ENCOUNTER — Inpatient Hospital Stay: Payer: Medicare HMO

## 2017-08-08 ENCOUNTER — Inpatient Hospital Stay: Payer: Medicare HMO | Attending: Oncology | Admitting: Oncology

## 2017-08-08 VITALS — BP 119/88 | HR 80 | Temp 97.9°F | Resp 18 | Ht 64.0 in | Wt 118.4 lb

## 2017-08-08 DIAGNOSIS — M8580 Other specified disorders of bone density and structure, unspecified site: Secondary | ICD-10-CM

## 2017-08-08 DIAGNOSIS — Z170 Estrogen receptor positive status [ER+]: Secondary | ICD-10-CM | POA: Diagnosis not present

## 2017-08-08 DIAGNOSIS — Z79811 Long term (current) use of aromatase inhibitors: Secondary | ICD-10-CM

## 2017-08-08 DIAGNOSIS — C50412 Malignant neoplasm of upper-outer quadrant of left female breast: Secondary | ICD-10-CM

## 2017-08-08 LAB — COMPREHENSIVE METABOLIC PANEL
ALT: 24 U/L (ref 0–55)
ANION GAP: 9 (ref 3–11)
AST: 24 U/L (ref 5–34)
Albumin: 4.2 g/dL (ref 3.5–5.0)
Alkaline Phosphatase: 71 U/L (ref 40–150)
BUN: 13 mg/dL (ref 7–26)
CHLORIDE: 105 mmol/L (ref 98–109)
CO2: 27 mmol/L (ref 22–29)
Calcium: 10.1 mg/dL (ref 8.4–10.4)
Creatinine, Ser: 0.99 mg/dL (ref 0.60–1.10)
GFR, EST NON AFRICAN AMERICAN: 52 mL/min — AB (ref >60)
Glucose, Bld: 103 mg/dL (ref 70–140)
POTASSIUM: 3.4 mmol/L — AB (ref 3.5–5.1)
SODIUM: 141 mmol/L (ref 136–145)
Total Bilirubin: 0.3 mg/dL (ref 0.2–1.2)
Total Protein: 6.9 g/dL (ref 6.4–8.3)

## 2017-08-08 LAB — CBC WITH DIFFERENTIAL/PLATELET
Basophils Absolute: 0 10*3/uL (ref 0.0–0.1)
Basophils Relative: 0 %
EOS ABS: 0 10*3/uL (ref 0.0–0.5)
Eosinophils Relative: 1 %
HEMATOCRIT: 39.6 % (ref 34.8–46.6)
HEMOGLOBIN: 13 g/dL (ref 11.6–15.9)
LYMPHS ABS: 1.6 10*3/uL (ref 0.9–3.3)
LYMPHS PCT: 32 %
MCH: 30.9 pg (ref 25.1–34.0)
MCHC: 32.8 g/dL (ref 31.5–36.0)
MCV: 94.1 fL (ref 79.5–101.0)
MONOS PCT: 7 %
Monocytes Absolute: 0.3 10*3/uL (ref 0.1–0.9)
NEUTROS ABS: 2.9 10*3/uL (ref 1.5–6.5)
NEUTROS PCT: 60 %
Platelets: 166 10*3/uL (ref 145–400)
RBC: 4.21 MIL/uL (ref 3.70–5.45)
RDW: 13.2 % (ref 11.2–14.5)
WBC: 4.9 10*3/uL (ref 3.9–10.3)

## 2017-08-08 MED ORDER — ANASTROZOLE 1 MG PO TABS: 1.0000 mg | ORAL_TABLET | Freq: Every day | ORAL | 4 refills | Status: DC

## 2017-08-08 NOTE — Telephone Encounter (Signed)
Gave patient AVS and calendar of upcoming feb 2020 appointments.

## 2017-08-10 DIAGNOSIS — C50919 Malignant neoplasm of unspecified site of unspecified female breast: Secondary | ICD-10-CM | POA: Diagnosis not present

## 2017-08-10 DIAGNOSIS — F324 Major depressive disorder, single episode, in partial remission: Secondary | ICD-10-CM | POA: Diagnosis not present

## 2017-08-10 DIAGNOSIS — M25511 Pain in right shoulder: Secondary | ICD-10-CM | POA: Diagnosis not present

## 2017-08-10 DIAGNOSIS — F419 Anxiety disorder, unspecified: Secondary | ICD-10-CM | POA: Diagnosis not present

## 2017-08-10 DIAGNOSIS — R69 Illness, unspecified: Secondary | ICD-10-CM | POA: Diagnosis not present

## 2017-08-24 DIAGNOSIS — K602 Anal fissure, unspecified: Secondary | ICD-10-CM | POA: Diagnosis not present

## 2017-08-24 DIAGNOSIS — R319 Hematuria, unspecified: Secondary | ICD-10-CM | POA: Diagnosis not present

## 2017-09-26 ENCOUNTER — Encounter: Payer: Self-pay | Admitting: Adult Health

## 2017-09-26 ENCOUNTER — Ambulatory Visit: Payer: Medicare HMO | Admitting: Adult Health

## 2017-09-27 ENCOUNTER — Ambulatory Visit: Payer: Medicare HMO | Admitting: Adult Health

## 2017-09-27 ENCOUNTER — Encounter: Payer: Self-pay | Admitting: Adult Health

## 2017-09-30 DIAGNOSIS — R2689 Other abnormalities of gait and mobility: Secondary | ICD-10-CM | POA: Diagnosis not present

## 2017-09-30 DIAGNOSIS — R0981 Nasal congestion: Secondary | ICD-10-CM | POA: Diagnosis not present

## 2017-10-11 DIAGNOSIS — J343 Hypertrophy of nasal turbinates: Secondary | ICD-10-CM | POA: Diagnosis not present

## 2017-10-11 DIAGNOSIS — J342 Deviated nasal septum: Secondary | ICD-10-CM | POA: Diagnosis not present

## 2017-10-11 DIAGNOSIS — J310 Chronic rhinitis: Secondary | ICD-10-CM | POA: Diagnosis not present

## 2017-10-11 DIAGNOSIS — J324 Chronic pansinusitis: Secondary | ICD-10-CM | POA: Diagnosis not present

## 2017-10-11 DIAGNOSIS — R51 Headache: Secondary | ICD-10-CM | POA: Diagnosis not present

## 2017-10-31 ENCOUNTER — Encounter

## 2017-10-31 ENCOUNTER — Ambulatory Visit: Payer: Medicare HMO | Admitting: Adult Health

## 2017-10-31 ENCOUNTER — Encounter: Payer: Self-pay | Admitting: Adult Health

## 2017-10-31 VITALS — BP 178/68 | HR 74 | Wt 114.0 lb

## 2017-10-31 DIAGNOSIS — F0280 Dementia in other diseases classified elsewhere without behavioral disturbance: Secondary | ICD-10-CM | POA: Diagnosis not present

## 2017-10-31 DIAGNOSIS — G301 Alzheimer's disease with late onset: Secondary | ICD-10-CM | POA: Diagnosis not present

## 2017-10-31 DIAGNOSIS — R69 Illness, unspecified: Secondary | ICD-10-CM | POA: Diagnosis not present

## 2017-10-31 NOTE — Progress Notes (Addendum)
PATIENT: Shelly Cervantes DOB: 1935/10/17  REASON FOR VISIT: follow up HISTORY FROM: patient  HISTORY OF PRESENT ILLNESS: Today 10/31/17:  Shelly Cervantes is an 82 year old female with a history of Alzheimer's disease.  She returns today for follow-up.  She is currently on Namenda and Aricept.  Reports that she is tolerating the medication well.  Her husband reports that she has lost weight since the last visit.  He reports that her appetite is not great.  He tries to supplement with Ensure' but she does not always drink them.  She lives at home with her husband.  Able to complete all ADLs independently.  She does not drive.  She no longer prepares meals.  Her husband manages all the finances.  She denies any trouble sleeping.  Denies any trouble with  mood or behavior.  Her husband reports that she has daily episodes of dizziness.  This can occur when she bends over or gets out of bed.  She is seeing Shelly Cervantes but they do not feel this is related to her sinus issues.  The patient's blood pressure is elevated today.  She has a physical with her primary care in the next 2 weeks.  She returns today for evaluation.  HISTORY 03/25/2017(Copied from Shelly Cervantes note):  She reports doing okay, husband has noted a decline in her memory. She is tolerating her memory medications. Dx of L breast cancer in June/July 2018, had L lumpectomy for br cancer on 11/25/16. Decided against XRT.   The patient's allergies, current medications, family history, past medical history, past social history, past surgical history and problem list were reviewed and updated as appropriate.   REVIEW OF SYSTEMS: Out of a complete 14 system review of symptoms, the patient complains only of the following symptoms, and all other reviewed systems are negative.  See HPI  ALLERGIES: Allergies  Allergen Reactions  . Codeine Rash    HOME MEDICATIONS: Outpatient Medications Prior to Visit  Medication Sig Dispense Refill  .  anastrozole (ARIMIDEX) 1 MG tablet Take 1 tablet (1 mg total) by mouth daily. 90 tablet 4  . aspirin 81 MG tablet Take 81 mg by mouth daily.    Marland Kitchen buPROPion (WELLBUTRIN XL) 300 MG 24 hr tablet Take 300 mg by mouth every morning.  12  . busPIRone (BUSPAR) 15 MG tablet Take 7.5-15 mg by mouth 2 (two) times daily. Takes 1 tablet in am and 0.5 tablet  at bedtime    . donepezil (ARICEPT) 10 MG tablet Take 1 tablet (10 mg total) by mouth daily. 90 tablet 3  . latanoprost (XALATAN) 0.005 % ophthalmic solution Place 1 drop into both eyes at bedtime.    Marland Kitchen lisinopril (PRINIVIL,ZESTRIL) 20 MG tablet Take 20 mg by mouth daily.  12  . memantine (NAMENDA) 10 MG tablet Take 10 mg by mouth 2 (two) times daily.    . Multiple Vitamin (MULTI-VITAMIN DAILY PO) Take 1 tablet by mouth daily.     . traMADol (ULTRAM) 50 MG tablet Take 1 tablet (50 mg total) by mouth every 6 (six) hours as needed. 10 tablet 1  . traZODone (DESYREL) 50 MG tablet Take 50 mg by mouth at bedtime.  12   No facility-administered medications prior to visit.     PAST MEDICAL HISTORY: Past Medical History:  Diagnosis Date  . Alzheimer's disease   . Anxiety   . Breast cancer ([place])    left  . Depression   . GERD (gastroesophageal reflux disease)  occ  . High cholesterol   . Hypertension     PAST SURGICAL HISTORY: Past Surgical History:  Procedure Laterality Date  . BREAST LUMPECTOMY WITH RADIOACTIVE SEED AND SENTINEL LYMPH NODE BIOPSY Left 11/25/2016   Procedure: LEFT BREAST LUMPECTOMY WITH RADIOACTIVE SEED AND AXILLARY SENTINEL LYMPH NODE BIOPSY;  Surgeon: Shelly Seltzer, MD;  Location: [place];  Service: General;  Laterality: Left;  . EYE SURGERY Bilateral    cataracts    FAMILY HISTORY: Family History  Problem Relation Age of Onset  . Colon cancer Mother   . Cancer Father   . Lung cancer Father   . Dementia Sister     SOCIAL HISTORY: Social History   Socioeconomic History  . Marital status: Married    Spouse  name: Not on file  . Number of children: 2  . Years of education: 2  . Highest education level: Not on file  Occupational History  . Occupation: Retired   Scientific laboratory technician  . Financial resource strain: Not on file  . Food insecurity:    Worry: Not on file    Inability: Not on file  . Transportation needs:    Medical: Not on file    Non-medical: Not on file  Tobacco Use  . Smoking status: Never Smoker  . Smokeless tobacco: Never Used  Substance and Sexual Activity  . Alcohol use: Yes    Alcohol/week: 1.8 oz    Types: 3 Glasses of wine per week    Comment: 1-2 glasses of wine 2x/week  . Drug use: No  . Sexual activity: Not on file  Lifestyle  . Physical activity:    Days per week: Not on file    Minutes per session: Not on file  . Stress: Not on file  Relationships  . Social connections:    Talks on phone: Not on file    Gets together: Not on file    Attends religious service: Not on file    Active member of club or organization: Not on file    Attends meetings of clubs or organizations: Not on file    Relationship status: Not on file  . Intimate partner violence:    Fear of current or ex partner: Not on file    Emotionally abused: Not on file    Physically abused: Not on file    Forced sexual activity: Not on file  Other Topics Concern  . Not on file  Social History Narrative   Denies caffeine use       PHYSICAL EXAM  Vitals:   10/31/17 0827  BP: (!) 178/68  Pulse: 74  SpO2: 96%  Weight: 114 lb (51.7 kg)   Body mass index is 19.57 kg/m.   MMSE - Mini Mental State Exam 10/31/2017 06/17/2016  Orientation to time 0 2  Orientation to Place 4 4  Registration 3 3  Attention/ Calculation 5 5  Recall 0 0  Language- name 2 objects 2 2  Language- repeat 1 1  Language- follow 3 step command 2 3  Language- read & follow direction 1 1  Write a sentence 1 1  Copy design 0 1  Total score 19 23     Generalized: Well developed, in no acute distress    Neurological examination  Mentation: Alert. Follows all commands speech and language fluent Cranial nerve II-XII: Pupils were equal round reactive to light. Extraocular movements were full, visual field were full on confrontational test. Facial sensation and strength were normal. Uvula tongue midline.  Head turning and shoulder shrug  were normal and symmetric. Motor: The motor testing reveals 5 over 5 strength of all 4 extremities. Good symmetric motor tone is noted throughout.  Sensory: Sensory testing is intact to soft touch on all 4 extremities. No evidence of extinction is noted.  Coordination: Cerebellar testing reveals good finger-nose-finger and heel-to-shin bilaterally.  Gait and station: The patient's gait is slightly unsteady.  The patient is wearing heels today. Reflexes: Deep tendon reflexes are symmetric and normal bilaterally.   DIAGNOSTIC DATA (LABS, IMAGING, TESTING) - I reviewed patient records, labs, notes, testing and imaging myself where available.  Lab Results  Component Value Date   WBC 4.9 08/08/2017   HGB 13.0 08/08/2017   HCT 39.6 08/08/2017   MCV 94.1 08/08/2017   PLT 166 08/08/2017      Component Value Date/Time   NA 141 08/08/2017 1417   NA 139 02/07/2017 1301   K 3.4 (L) 08/08/2017 1417   K 4.0 02/07/2017 1301   CL 105 08/08/2017 1417   CO2 27 08/08/2017 1417   CO2 26 02/07/2017 1301   GLUCOSE 103 08/08/2017 1417   GLUCOSE 98 02/07/2017 1301   BUN 13 08/08/2017 1417   BUN 18.3 02/07/2017 1301   CREATININE 0.99 08/08/2017 1417   CREATININE 1.2 (H) 02/07/2017 1301   CALCIUM 10.1 08/08/2017 1417   CALCIUM 10.2 02/07/2017 1301   PROT 6.9 08/08/2017 1417   PROT 7.5 02/07/2017 1301   ALBUMIN 4.2 08/08/2017 1417   ALBUMIN 4.3 02/07/2017 1301   AST 24 08/08/2017 1417   AST 23 02/07/2017 1301   ALT 24 08/08/2017 1417   ALT 24 02/07/2017 1301   ALKPHOS 71 08/08/2017 1417   ALKPHOS 64 02/07/2017 1301   BILITOT 0.3 08/08/2017 1417   BILITOT 0.45  02/07/2017 1301   GFRNONAA 52 (L) 08/08/2017 1417   GFRAA >60 08/08/2017 1417      ASSESSMENT AND PLAN 82 y.o. year old female  has a past medical history of Alzheimer's disease, Anxiety, Breast cancer ([place]), Depression, GERD (gastroesophageal reflux disease), High cholesterol, and Hypertension. here with:  1.  Memory disturbance  The patient's memory score has remained stable.  She will continue on Aricept and Namenda.  I have advised her husband to monitor her weight over the next month.  If he continues to notice that her weight is increasing he should call us and we will most likely will discontinue Aricept.  The patient is encouraged to wear tennis shoes or flats as her gait was unsteady in heels today.  The patient is advised that if her symptoms worsen or she develops new symptoms they should let us know.  She will follow-up in 6 months or sooner if needed.   I spent 15 minutes with the patient. 50% of this time was spent discussing her memory score.  Ward Givens, MSN, NP-C 10/31/2017, 8:37 AM [facility] [address], [place], [place] [address] [phone]  I reviewed the above note and documentation by the Nurse Practitioner and agree with the history, physical exam, assessment and plan as outlined above. I was immediately available for face-to-face consultation. Star Age, MD, PhD [facility])

## 2017-10-31 NOTE — Patient Instructions (Addendum)
Your Plan:  Continue namenda and aricept  Memory score is stable Speak to PCP about blood pressure If your symptoms worsen or you develop new symptoms please let us know.   Thank you for coming to see [place] at [facility]. I hope we have been able to provide you high quality care today.  You may receive a patient satisfaction survey over the next few weeks. We would appreciate your feedback and comments so that we may continue to improve ourselves and the health of our patients.

## 2017-11-16 DIAGNOSIS — I10 Essential (primary) hypertension: Secondary | ICD-10-CM | POA: Diagnosis not present

## 2017-11-16 DIAGNOSIS — E7800 Pure hypercholesterolemia, unspecified: Secondary | ICD-10-CM | POA: Diagnosis not present

## 2017-11-16 DIAGNOSIS — Z7982 Long term (current) use of aspirin: Secondary | ICD-10-CM | POA: Diagnosis not present

## 2017-11-16 DIAGNOSIS — R42 Dizziness and giddiness: Secondary | ICD-10-CM | POA: Diagnosis not present

## 2017-11-16 DIAGNOSIS — R2689 Other abnormalities of gait and mobility: Secondary | ICD-10-CM | POA: Diagnosis not present

## 2017-11-16 DIAGNOSIS — R69 Illness, unspecified: Secondary | ICD-10-CM | POA: Diagnosis not present

## 2017-11-22 ENCOUNTER — Encounter: Payer: Self-pay | Admitting: Oncology

## 2017-11-22 DIAGNOSIS — R928 Other abnormal and inconclusive findings on diagnostic imaging of breast: Secondary | ICD-10-CM | POA: Diagnosis not present

## 2017-11-25 DIAGNOSIS — I10 Essential (primary) hypertension: Secondary | ICD-10-CM | POA: Diagnosis not present

## 2017-11-25 DIAGNOSIS — R2689 Other abnormalities of gait and mobility: Secondary | ICD-10-CM | POA: Diagnosis not present

## 2017-11-25 DIAGNOSIS — E7800 Pure hypercholesterolemia, unspecified: Secondary | ICD-10-CM | POA: Diagnosis not present

## 2017-11-25 DIAGNOSIS — R69 Illness, unspecified: Secondary | ICD-10-CM | POA: Diagnosis not present

## 2017-11-25 DIAGNOSIS — Z7982 Long term (current) use of aspirin: Secondary | ICD-10-CM | POA: Diagnosis not present

## 2017-11-25 DIAGNOSIS — R42 Dizziness and giddiness: Secondary | ICD-10-CM | POA: Diagnosis not present

## 2017-11-30 DIAGNOSIS — R42 Dizziness and giddiness: Secondary | ICD-10-CM | POA: Diagnosis not present

## 2017-11-30 DIAGNOSIS — E7800 Pure hypercholesterolemia, unspecified: Secondary | ICD-10-CM | POA: Diagnosis not present

## 2017-11-30 DIAGNOSIS — Z7982 Long term (current) use of aspirin: Secondary | ICD-10-CM | POA: Diagnosis not present

## 2017-11-30 DIAGNOSIS — R2689 Other abnormalities of gait and mobility: Secondary | ICD-10-CM | POA: Diagnosis not present

## 2017-11-30 DIAGNOSIS — R69 Illness, unspecified: Secondary | ICD-10-CM | POA: Diagnosis not present

## 2017-11-30 DIAGNOSIS — I10 Essential (primary) hypertension: Secondary | ICD-10-CM | POA: Diagnosis not present

## 2017-12-02 DIAGNOSIS — E7800 Pure hypercholesterolemia, unspecified: Secondary | ICD-10-CM | POA: Diagnosis not present

## 2017-12-02 DIAGNOSIS — I10 Essential (primary) hypertension: Secondary | ICD-10-CM | POA: Diagnosis not present

## 2017-12-02 DIAGNOSIS — Z7982 Long term (current) use of aspirin: Secondary | ICD-10-CM | POA: Diagnosis not present

## 2017-12-02 DIAGNOSIS — R2689 Other abnormalities of gait and mobility: Secondary | ICD-10-CM | POA: Diagnosis not present

## 2017-12-02 DIAGNOSIS — R69 Illness, unspecified: Secondary | ICD-10-CM | POA: Diagnosis not present

## 2017-12-02 DIAGNOSIS — R42 Dizziness and giddiness: Secondary | ICD-10-CM | POA: Diagnosis not present

## 2017-12-06 DIAGNOSIS — E7800 Pure hypercholesterolemia, unspecified: Secondary | ICD-10-CM | POA: Diagnosis not present

## 2017-12-06 DIAGNOSIS — R42 Dizziness and giddiness: Secondary | ICD-10-CM | POA: Diagnosis not present

## 2017-12-06 DIAGNOSIS — Z7982 Long term (current) use of aspirin: Secondary | ICD-10-CM | POA: Diagnosis not present

## 2017-12-06 DIAGNOSIS — I10 Essential (primary) hypertension: Secondary | ICD-10-CM | POA: Diagnosis not present

## 2017-12-06 DIAGNOSIS — R69 Illness, unspecified: Secondary | ICD-10-CM | POA: Diagnosis not present

## 2017-12-06 DIAGNOSIS — R2689 Other abnormalities of gait and mobility: Secondary | ICD-10-CM | POA: Diagnosis not present

## 2017-12-08 DIAGNOSIS — I10 Essential (primary) hypertension: Secondary | ICD-10-CM | POA: Diagnosis not present

## 2017-12-08 DIAGNOSIS — E7800 Pure hypercholesterolemia, unspecified: Secondary | ICD-10-CM | POA: Diagnosis not present

## 2017-12-12 DIAGNOSIS — Z7982 Long term (current) use of aspirin: Secondary | ICD-10-CM | POA: Diagnosis not present

## 2017-12-12 DIAGNOSIS — R42 Dizziness and giddiness: Secondary | ICD-10-CM | POA: Diagnosis not present

## 2017-12-12 DIAGNOSIS — R69 Illness, unspecified: Secondary | ICD-10-CM | POA: Diagnosis not present

## 2017-12-12 DIAGNOSIS — E7800 Pure hypercholesterolemia, unspecified: Secondary | ICD-10-CM | POA: Diagnosis not present

## 2017-12-12 DIAGNOSIS — R2689 Other abnormalities of gait and mobility: Secondary | ICD-10-CM | POA: Diagnosis not present

## 2017-12-12 DIAGNOSIS — I10 Essential (primary) hypertension: Secondary | ICD-10-CM | POA: Diagnosis not present

## 2017-12-13 DIAGNOSIS — R69 Illness, unspecified: Secondary | ICD-10-CM | POA: Diagnosis not present

## 2017-12-13 DIAGNOSIS — R011 Cardiac murmur, unspecified: Secondary | ICD-10-CM | POA: Diagnosis not present

## 2017-12-13 DIAGNOSIS — Z0000 Encounter for general adult medical examination without abnormal findings: Secondary | ICD-10-CM | POA: Diagnosis not present

## 2017-12-13 DIAGNOSIS — R2689 Other abnormalities of gait and mobility: Secondary | ICD-10-CM | POA: Diagnosis not present

## 2017-12-13 DIAGNOSIS — K219 Gastro-esophageal reflux disease without esophagitis: Secondary | ICD-10-CM | POA: Diagnosis not present

## 2017-12-13 DIAGNOSIS — I10 Essential (primary) hypertension: Secondary | ICD-10-CM | POA: Diagnosis not present

## 2017-12-13 DIAGNOSIS — E7800 Pure hypercholesterolemia, unspecified: Secondary | ICD-10-CM | POA: Diagnosis not present

## 2017-12-13 DIAGNOSIS — Z23 Encounter for immunization: Secondary | ICD-10-CM | POA: Diagnosis not present

## 2017-12-13 DIAGNOSIS — R634 Abnormal weight loss: Secondary | ICD-10-CM | POA: Diagnosis not present

## 2017-12-14 DIAGNOSIS — Z7982 Long term (current) use of aspirin: Secondary | ICD-10-CM | POA: Diagnosis not present

## 2017-12-14 DIAGNOSIS — R42 Dizziness and giddiness: Secondary | ICD-10-CM | POA: Diagnosis not present

## 2017-12-14 DIAGNOSIS — I10 Essential (primary) hypertension: Secondary | ICD-10-CM | POA: Diagnosis not present

## 2017-12-14 DIAGNOSIS — R2689 Other abnormalities of gait and mobility: Secondary | ICD-10-CM | POA: Diagnosis not present

## 2017-12-14 DIAGNOSIS — E7800 Pure hypercholesterolemia, unspecified: Secondary | ICD-10-CM | POA: Diagnosis not present

## 2017-12-14 DIAGNOSIS — R69 Illness, unspecified: Secondary | ICD-10-CM | POA: Diagnosis not present

## 2017-12-20 DIAGNOSIS — J3489 Other specified disorders of nose and nasal sinuses: Secondary | ICD-10-CM | POA: Diagnosis not present

## 2017-12-20 DIAGNOSIS — Z7289 Other problems related to lifestyle: Secondary | ICD-10-CM | POA: Diagnosis not present

## 2017-12-20 DIAGNOSIS — H6123 Impacted cerumen, bilateral: Secondary | ICD-10-CM | POA: Diagnosis not present

## 2017-12-20 DIAGNOSIS — J310 Chronic rhinitis: Secondary | ICD-10-CM | POA: Diagnosis not present

## 2017-12-20 DIAGNOSIS — J342 Deviated nasal septum: Secondary | ICD-10-CM | POA: Diagnosis not present

## 2017-12-20 DIAGNOSIS — J329 Chronic sinusitis, unspecified: Secondary | ICD-10-CM | POA: Diagnosis not present

## 2017-12-20 DIAGNOSIS — J330 Polyp of nasal cavity: Secondary | ICD-10-CM | POA: Diagnosis not present

## 2017-12-20 DIAGNOSIS — J343 Hypertrophy of nasal turbinates: Secondary | ICD-10-CM | POA: Diagnosis not present

## 2017-12-21 ENCOUNTER — Other Ambulatory Visit: Payer: Self-pay | Admitting: Oncology

## 2017-12-21 DIAGNOSIS — I10 Essential (primary) hypertension: Secondary | ICD-10-CM | POA: Diagnosis not present

## 2017-12-21 DIAGNOSIS — R2689 Other abnormalities of gait and mobility: Secondary | ICD-10-CM | POA: Diagnosis not present

## 2017-12-21 DIAGNOSIS — E7800 Pure hypercholesterolemia, unspecified: Secondary | ICD-10-CM | POA: Diagnosis not present

## 2017-12-21 DIAGNOSIS — R69 Illness, unspecified: Secondary | ICD-10-CM | POA: Diagnosis not present

## 2017-12-21 DIAGNOSIS — R42 Dizziness and giddiness: Secondary | ICD-10-CM | POA: Diagnosis not present

## 2017-12-21 DIAGNOSIS — Z7982 Long term (current) use of aspirin: Secondary | ICD-10-CM | POA: Diagnosis not present

## 2018-01-03 DIAGNOSIS — Z7982 Long term (current) use of aspirin: Secondary | ICD-10-CM | POA: Diagnosis not present

## 2018-01-03 DIAGNOSIS — R42 Dizziness and giddiness: Secondary | ICD-10-CM | POA: Diagnosis not present

## 2018-01-03 DIAGNOSIS — E7800 Pure hypercholesterolemia, unspecified: Secondary | ICD-10-CM | POA: Diagnosis not present

## 2018-01-03 DIAGNOSIS — R2689 Other abnormalities of gait and mobility: Secondary | ICD-10-CM | POA: Diagnosis not present

## 2018-01-03 DIAGNOSIS — R69 Illness, unspecified: Secondary | ICD-10-CM | POA: Diagnosis not present

## 2018-01-03 DIAGNOSIS — I10 Essential (primary) hypertension: Secondary | ICD-10-CM | POA: Diagnosis not present

## 2018-01-06 DIAGNOSIS — I10 Essential (primary) hypertension: Secondary | ICD-10-CM | POA: Diagnosis not present

## 2018-01-06 DIAGNOSIS — E7800 Pure hypercholesterolemia, unspecified: Secondary | ICD-10-CM | POA: Diagnosis not present

## 2018-01-06 DIAGNOSIS — R69 Illness, unspecified: Secondary | ICD-10-CM | POA: Diagnosis not present

## 2018-01-06 DIAGNOSIS — Z7982 Long term (current) use of aspirin: Secondary | ICD-10-CM | POA: Diagnosis not present

## 2018-01-06 DIAGNOSIS — R2689 Other abnormalities of gait and mobility: Secondary | ICD-10-CM | POA: Diagnosis not present

## 2018-01-06 DIAGNOSIS — R42 Dizziness and giddiness: Secondary | ICD-10-CM | POA: Diagnosis not present

## 2018-01-31 DIAGNOSIS — H401111 Primary open-angle glaucoma, right eye, mild stage: Secondary | ICD-10-CM | POA: Diagnosis not present

## 2018-01-31 DIAGNOSIS — H401122 Primary open-angle glaucoma, left eye, moderate stage: Secondary | ICD-10-CM | POA: Diagnosis not present

## 2018-01-31 DIAGNOSIS — H40053 Ocular hypertension, bilateral: Secondary | ICD-10-CM | POA: Diagnosis not present

## 2018-01-31 DIAGNOSIS — Z961 Presence of intraocular lens: Secondary | ICD-10-CM | POA: Diagnosis not present

## 2018-02-13 DIAGNOSIS — H40052 Ocular hypertension, left eye: Secondary | ICD-10-CM | POA: Diagnosis not present

## 2018-02-13 DIAGNOSIS — H401122 Primary open-angle glaucoma, left eye, moderate stage: Secondary | ICD-10-CM | POA: Diagnosis not present

## 2018-02-27 DIAGNOSIS — H401122 Primary open-angle glaucoma, left eye, moderate stage: Secondary | ICD-10-CM | POA: Diagnosis not present

## 2018-02-27 DIAGNOSIS — H40053 Ocular hypertension, bilateral: Secondary | ICD-10-CM | POA: Diagnosis not present

## 2018-03-14 DIAGNOSIS — F324 Major depressive disorder, single episode, in partial remission: Secondary | ICD-10-CM | POA: Diagnosis not present

## 2018-03-14 DIAGNOSIS — R69 Illness, unspecified: Secondary | ICD-10-CM | POA: Diagnosis not present

## 2018-04-13 DIAGNOSIS — H401122 Primary open-angle glaucoma, left eye, moderate stage: Secondary | ICD-10-CM | POA: Diagnosis not present

## 2018-04-13 DIAGNOSIS — H40053 Ocular hypertension, bilateral: Secondary | ICD-10-CM | POA: Diagnosis not present

## 2018-04-13 DIAGNOSIS — H401111 Primary open-angle glaucoma, right eye, mild stage: Secondary | ICD-10-CM | POA: Diagnosis not present

## 2018-05-04 ENCOUNTER — Ambulatory Visit: Payer: Medicare HMO | Admitting: Neurology

## 2018-05-04 ENCOUNTER — Encounter: Payer: Self-pay | Admitting: Neurology

## 2018-05-04 VITALS — BP 149/74 | HR 80 | Ht 64.0 in | Wt 128.0 lb

## 2018-05-04 DIAGNOSIS — F0280 Dementia in other diseases classified elsewhere without behavioral disturbance: Secondary | ICD-10-CM | POA: Diagnosis not present

## 2018-05-04 DIAGNOSIS — G301 Alzheimer's disease with late onset: Secondary | ICD-10-CM

## 2018-05-04 DIAGNOSIS — R69 Illness, unspecified: Secondary | ICD-10-CM | POA: Diagnosis not present

## 2018-05-04 MED ORDER — DONEPEZIL HCL 10 MG PO TABS: 10.0000 mg | ORAL_TABLET | Freq: Every day | ORAL | 3 refills | Status: DC

## 2018-05-04 NOTE — Patient Instructions (Signed)
Your memory has been stable, thankfully.  Your weight is better.  Please continue with your healthy lifestyle.  We will see you back in 6 months.  Please continue with the Donepezil and memantine.

## 2018-05-04 NOTE — Progress Notes (Signed)
Subjective:    Patient ID: Shelly Cervantes is a 83 y.o. female.  HPI     Interim history:   Shelly Cervantes is an 83 year old right-handed woman with an underlying medical history of anxiety, depression, hyperlipidemia, hypertension, insomnia, low back pain, and glaucoma, who presents for follow-up consultation of Shelly Cervantes dementia. The patient is accompanied by Shelly Cervantes daughter and Shelly Cervantes today. I last saw Shelly Cervantes on 03/25/2017, at which time Shelly Cervantes husband reported that there was a decline in Shelly Cervantes memory. Shelly Cervantes was tolerating dual memory medications. Shelly Cervantes had undergone left-sided lumpectomy for a breast cancer diagnosis, which was diagnosed in the summer of 2018 and Shelly Cervantes had surgery in August 2018, Shelly Cervantes did not have any subsequent XRT.  Shelly Cervantes saw Ward Givens, nausea tissue in the interim on 10/31/2017, at which time Shelly Cervantes MMSE was 19. Shelly Cervantes was advised to continue with Shelly Cervantes Namenda and Aricept generic.  Today, 05/04/2018 (all dictated new, as well as above notes, some dictation done in note pad or Word, outside of chart, may appear as copied):    Shelly Cervantes reports feeling well, Shelly Cervantes history is primarily provided by Shelly Cervantes husband. He reports that Shelly Cervantes has occasional flareup in Shelly Cervantes anxiety but otherwise Shelly Cervantes depression and outlook on things are much better. Shelly Cervantes has a better appetite and Shelly Cervantes weight improved as well, back to the upper 120s where Shelly Cervantes typically has been. Shelly Cervantes has regular follow-up with Shelly Cervantes oncologist. Shelly Cervantes has been exercising in the form of walking inside a mall. They try to walk together. Shelly Cervantes has been trying to hydrate well with water. Tolerates Shelly Cervantes generic Aricept and Namenda. Namenda is typically refilled by Shelly Cervantes PCP.   The patient's allergies, current medications, family history, past medical history, past social history, past surgical history and problem list were reviewed and updated as appropriate.    Previously (copied from previous notes for reference):   I saw Shelly Cervantes on 09/20/2016, at which time Shelly Cervantes reported doing okay. Shelly Cervantes  was on donepezil 5 mg in the evening and generic Namenda 10 mg twice a day. I suggested we increase this to 10 mg daily if tolerated. Shelly Cervantes may have had some GI side effects in the past on the 10 mg strength. We also talked about Shelly Cervantes neuropsychological evaluation with Dr. Bonita Quin in April 2018 with findings in keeping with mild to moderate dementia, most likely of the Alzheimer's type.    I saw Shelly Cervantes on 06/17/2016 for memory loss. At the time, Shelly Cervantes MMSE was MMSE: 23/30, CDT: 4/4, AFT: 7/min. Shelly Cervantes was taking Namenda and Aricept and I suggested we switch Shelly Cervantes to [employer] for convenience. Shelly Cervantes was advised to be better hydrated with water and reduce and eliminate in fact Shelly Cervantes alcohol consumption. I referred Shelly Cervantes for cognitive testing.   Shelly Cervantes had neuropsychological testing on 07/26/2016 and a follow-up appointment with neuropsychology on 08/05/2016. I reviewed Dr. Si Raider Heath's neuropsychological report:    <<Impressions: Mild to moderate dementia most likely secondary to Alzheimer's disease. Results of the current cognitive evaluation reveal several areas of impairment, including temporal orientation, verbal and non-verbal memory, semantic retrieval (i.e., semantic fluency and confrontation naming), mental flexibility and set-shifting, and abstract reasoning. Additionally, there is evidence that Shelly Cervantes cognitive deficits are interfering with Shelly Cervantes ability to manage complex tasks, such as managing finances, medications, driving and cooking. As such, diagnostic criteria for a dementia syndrome are met.    The patient's cognitive profile is suggestive of hippocampal consolidation dysfunction and medial-temporal lobe involvement. Alzheimer's disease is the most likely etiology, given Shelly Cervantes  cognitive profile clinical features and disease course. Due to the level of impairment on testing and functional decline in daily life, Shelly Cervantes is considered in the mild to moderate stage of dementia. Shelly Cervantes is experiencing increased depression and  anxiety which are considered secondary to Shelly Cervantes dementia as opposed to a primary psychiatric disorder.    Recommendations: Based on the findings of the present evaluation, the following recommendations are offered:   1. The patient is considered an appropriate candidate for cholinesterase inhibitor therapy. Shelly Cervantes should continue treatment with Aricept/Namenda. 2. The patient should continue to receive assistance with complex ADLs, including transportation, finances, medications, cooking and transportation. Shelly Cervantes is not driving.  3. The patient's family may wish to attend a local dementia caregiver support group and/or seek additional information from the Alzheimer's Association ([employer]).  4. The patient should continue to participate in activities which provide mental stimulation, safe cardiovascular exercise, and social interaction. Listening to music regularly is encouraged as this is something that Shelly Cervantes enjoys and is shown to improve mood/engagement in individuals with AD.  5. The patient's husband is most concerned about the patient's reduce interest in activities Shelly Cervantes used to enjoy, and Shelly Cervantes does demonstrate some depression and anxiety. Shelly Cervantes is already prescribed Wellbutrin and Buspirone. Consultation with a geriatric psychiatrist such as Dr. Norma Fredrickson may be helpful in determining if a change in medication needs to be made. >>   I met Shelly Cervantes in February 2014, at which time Shelly Cervantes MMSE was 27 out of 30, clock drawing was 4 out of 4. Of note, Shelly Cervantes did not show for a follow-up appointment which was scheduled for April 2014. Shelly Cervantes had a brain MRI without contrast on 06/26/2012 which showed mild perisylvian, subcortical mesial temporal atrophy, mild chronic small vessel ischemic disease.  Shelly Cervantes was started on donepezil in November 2015, has been on 5 mg once daily in the evening. Shelly Cervantes may have tried a higher dose per husband, but he reports that Shelly Cervantes had GI side effects. Shelly Cervantes has been on Namenda generic 10 mg once  daily in the morning since September 2017. Unclear if this was escalated to 10 mg twice daily but Shelly Cervantes may have had side effects on a higher dose per husband. Shelly Cervantes was started on BuSpar in September 2017 as well for anxiety. Shelly Cervantes has been on Wellbutrin long-acting for over one year for Shelly Cervantes depression. Mood wise, Shelly Cervantes has been doing well, feels stable, per husband, Shelly Cervantes has done quite well with respect to depression and anxiety in the past 6 months. Shelly Cervantes no longer drives, Shelly Cervantes has not driven a car and over 18 months per husband and daughter. Shelly Cervantes has 2 daughters, 3 grandchildren and one great-grandchild. Bedtime is early, around 7 PM or 8 PM, often more closer to 7. Wake up time is around 6 AM. Shelly Cervantes does take a couple of naps during the day. Shelly Cervantes does not always drink enough water and does not exercise regularly. Shelly Cervantes drinks coffee 1 or 2 cups per morning and some juice, maybe 1 glass per day and wine, 1 glass every other day or so. Shelly Cervantes memory loss has worsened over time. Shelly Cervantes does not have a telltale history of dementia and Shelly Cervantes family, Shelly Cervantes older sister has memory loss, Shelly Cervantes is 80 years older. Per previous records: One brother died in his 61s from [place]. Mother lived to be 38 years old, father died of lung cancer at age 82. Shelly Cervantes is a nonsmoker.  Shelly Cervantes Past Medical History Is Significant For: Past Medical History:  Diagnosis Date  . Alzheimer's disease ([place])   . Anxiety   . Breast cancer ([place])    left  . Depression   . GERD (gastroesophageal reflux disease)    occ  . High cholesterol   . Hypertension     Shelly Cervantes Past Surgical History Is Significant For: Past Surgical History:  Procedure Laterality Date  . BREAST LUMPECTOMY WITH RADIOACTIVE SEED AND SENTINEL LYMPH NODE BIOPSY Left 11/25/2016   Procedure: LEFT BREAST LUMPECTOMY WITH RADIOACTIVE SEED AND AXILLARY SENTINEL LYMPH NODE BIOPSY;  Surgeon: Excell Seltzer, MD;  Location: [facility];  Service: General;  Laterality: Left;  . EYE SURGERY Bilateral    cataracts     Shelly Cervantes Family History Is Significant For: Family History  Problem Relation Age of Onset  . Colon cancer Mother   . Cancer Father   . Lung cancer Father   . Dementia Sister     Shelly Cervantes Social History Is Significant For: Social History   Socioeconomic History  . Marital status: Married    Spouse name: Not on file  . Number of children: 2  . Years of education: 2  . Highest education level: Not on file  Occupational History  . Occupation: Retired   Scientific laboratory technician  . Financial resource strain: Not on file  . Food insecurity:    Worry: Not on file    Inability: Not on file  . Transportation needs:    Medical: Not on file    Non-medical: Not on file  Tobacco Use  . Smoking status: Never Smoker  . Smokeless tobacco: Never Used  Substance and Sexual Activity  . Alcohol use: Yes    Alcohol/week: 3.0 standard drinks    Types: 3 Glasses of wine per week    Comment: 1-2 glasses of wine 2x/week  . Drug use: No  . Sexual activity: Not on file  Lifestyle  . Physical activity:    Days per week: Not on file    Minutes per session: Not on file  . Stress: Not on file  Relationships  . Social connections:    Talks on phone: Not on file    Gets together: Not on file    Attends religious service: Not on file    Active member of club or organization: Not on file    Attends meetings of clubs or organizations: Not on file    Relationship status: Not on file  Other Topics Concern  . Not on file  Social History Narrative   Denies caffeine use     Shelly Cervantes Allergies Are:  Allergies  Allergen Reactions  . Codeine Rash  :   Shelly Cervantes Current Medications Are:  Outpatient Encounter Medications as of 05/04/2018  Medication Sig  . anastrozole (ARIMIDEX) 1 MG tablet TAKE 1 TABLET BY MOUTH EVERY DAY  . aspirin 81 MG tablet Take 81 mg by mouth daily.  Marland Kitchen buPROPion (WELLBUTRIN XL) 300 MG 24 hr tablet Take 300 mg by mouth every morning.  . donepezil (ARICEPT) 10 MG tablet Take 1 tablet (10 mg total) by  mouth daily.  Marland Kitchen lisinopril (PRINIVIL,ZESTRIL) 20 MG tablet Take 20 mg by mouth daily.  . memantine (NAMENDA) 10 MG tablet Take 10 mg by mouth 2 (two) times daily.  . Multiple Vitamin (MULTI-VITAMIN DAILY PO) Take 1 tablet by mouth daily.   . traZODone (DESYREL) 50 MG tablet Take 50 mg by mouth at bedtime.  . [DISCONTINUED] busPIRone (BUSPAR) 15 MG tablet Take 7.5-15 mg by mouth 2 (two) times daily.  Takes 1 tablet in am and 0.5 tablet  at bedtime  . [DISCONTINUED] latanoprost (XALATAN) 0.005 % ophthalmic solution Place 1 drop into both eyes at bedtime.  . [DISCONTINUED] traMADol (ULTRAM) 50 MG tablet Take 1 tablet (50 mg total) by mouth every 6 (six) hours as needed.   No facility-administered encounter medications on file as of 05/04/2018.   :  Review of Systems:  Out of a complete 14 point review of systems, all are reviewed and negative with the exception of these symptoms as listed below:   Review of Systems  Neurological:       Pt presents today to discuss Shelly Cervantes memory. Pt feels that it has been stable.    Objective:  Neurological Exam  Physical Exam Physical Examination:   Vitals:   05/04/18 0939  BP: (!) 149/74  Pulse: 80   General Examination: The patient is a very pleasant 83 y.o. female in no acute distress. Shelly Cervantes appears well-developed and well-nourished and well groomed. Good spirits.   HEENT:Normocephalic, atraumatic, pupils are equal, round and reactive to light and accommodation. Extraocular tracking is good without limitation to gaze excursion or nystagmus noted. Normal smooth pursuit is noted. Hearing is grossly intact. Face is symmetric with normal facial animation and normal facial sensation. Speech is clear with no dysarthria noted. There is no hypophonia. There is no lip, neck/head, jaw or voice tremor. Neck shows FROM. Oropharynx exam reveals: mildmouth dryness, adequatedental hygiene and mildairway crowding. Tongue protrudes centrally and palate elevates  symmetrically.   Chest:Clear to auscultation without wheezing, rhonchi or crackles noted. No tenderness left chest area.   Heart:S1+S2+0, regular and normal without murmurs, rubs or gallops noted.   Abdomen:Soft, non-tender and non-distended with normal bowel sounds appreciated on auscultation.  Extremities:There is noedema in the distal lower extremities bilaterally.  Skin: Warm and dry without trophic changes noted.  Musculoskeletal: exam reveals no obvious joint deformities, tenderness or joint swelling or erythema.   Neurologically:  Mental status: The patient is awake, alert and oriented to place, self, circumstance. Shelly Cervantes history is mainly provided by Shelly Cervantes husband. Herimmediate and remote memory, attention, language skills and fund of knowledge are impaired. Speech is clear with normal prosody and enunciation. Thought process is linear. Mood is normaland affect isgood.    On 05/04/2018: MMSE: 21/30, CDT: 3/4, AFT: 7/min.   On 03/25/2017: MMSE: 20/30, CDT: 4/4, AFT: 8/min.  On 06/17/2016: MMSE: 23/30, CDT: 4/4, AFT: 7/min.  In Feb. 2014:MMSE was 27 out of 30, clock drawing was 4 out of 4.  Cranial nerves II - XII are as described above under HEENT exam.  Motor exam: Normal bulk, strength for age and tone is noted. There is no drift, tremor or rebound. Fine motor skills and coordination: grossly intact.  Cerebellar testing: No dysmetria or intention tremor. Sensory exam: intact to light touch in the upper and lower extremities.  Gait, station and balance: Shestands easily. No veering to one side is noted. No leaning to one side is noted. Posture is age-appropriate and stance is narrow based.Balance is mildly impaired, tandem walk is nottested for safety reasons.   Assessment and Plan:   In summary, Shelly Cervantes a very pleasant 83 year old female with an underlying medical history of anxiety, depression, hyperlipidemia, hypertension, insomnia, low back  pain, and glaucoma, whopresents for follow-up consultation of herAD, with herr history, physical examination, memory scores and brain MRI testing, as well as neuropsychological test results in April 2018 are supportive of the diagnosis of Alzheimers  disease in the mild-to-moderate range. Shehas had a Hx of anxiety and depression which lately have been under good control on medications but Shelly Cervantes does have intermittent flare up of anxiety per husband's report. Shelly Cervantes could not afford Namzaric 28-10 milligrams strength,and has been on generic Namenda twice daily and donepezil 10 mg daily, tolerating both medications. Shelly Cervantes has with time reduced Shelly Cervantes alcohol intake, per husband, and has been hydrating well with water. Shelly Cervantes had some weight loss, but Shelly Cervantes weight and appetite have improved over the past year. Shelly Cervantes is trying to exercise in the form of walking. Shelly Cervantes was diagnosed with left-sided breast cancer and had a lumpectomy in August 2018. Shelly Cervantes has regular FUs with oncology. Overall, Shelly Cervantes is doing well at this time. I would like for Shelly Cervantes to continue Shelly Cervantes medications, we will do a 6 month recheck with one of our nurse practitioners. I answered all their questions today and the patient and Shelly Cervantes husband and daughter were in agreement.  I spent 25 minutes in total face-to-face time with the patient, more than 50% of which was spent in counseling and coordination of care, reviewing test results, reviewing medication and discussing or reviewing the diagnosis of dementia, its prognosis and treatment options. Pertinent laboratory and imaging test results that were available during this visit with the patient were reviewed by me and considered in my medical decision making (see chart for details).

## 2018-05-11 DIAGNOSIS — M79641 Pain in right hand: Secondary | ICD-10-CM | POA: Diagnosis not present

## 2018-05-11 DIAGNOSIS — M65831 Other synovitis and tenosynovitis, right forearm: Secondary | ICD-10-CM | POA: Diagnosis not present

## 2018-05-18 ENCOUNTER — Ambulatory Visit: Payer: Medicare HMO | Admitting: Adult Health

## 2018-06-02 NOTE — Progress Notes (Signed)
[facility]  [phone] [phone]     ID: CHELBI HERBER DOB: 08/12/1935  MR#: [id number]  RSW#:[id number]  Patient Care Team: [name], MD as PCP - General (Family Medicine) Excell Seltzer, MD as Consulting Physician (General Surgery) [name], [name], MD as Consulting Physician (Oncology) Gery Pray, MD as Consulting Physician (Radiation Oncology) Melvenia Beam, MD as Consulting Physician (Neurology) OTHER MD:   CHIEF COMPLAINT: Estrogen receptor positive lobular breast cancer  CURRENT TREATMENT: Anastrozole   BREAST CANCER HISTORY: From the original intake note:  The patient's primary care physician Dr. Harrington Challenger noted a change in the patient's left breast upper outer quadrant and set her up for bilateral diagnostic mammography with tomography and left breast ultrasonography at [facility] 10/08/2016. The breast density was category B. In the left breast upper outer quadrant there was a new 3 cm area of asymmetry with architectural distortion. Ultrasound measured this at 2.5 cm. There were no abnormalities in the left axilla by sonography.  On 10/11/2016 the patient underwent core biopsy of the left breast mass in question, and this showed ([id number]) invasive lobular carcinoma, E-cadherin negative, grade 2, estrogen receptor 90% positive with strong staining intensity but progesterone receptor negative. MIB-1 was 10% and there was insufficient invasive tumor for HER-2 evaluation.  The patient's subsequent history is as detailed below.   INTERVAL HISTORY: Shaelin returns today for follow-up and treatment of her estrogen receptor positive lobular breast cancer. She is accompanied by her husband.  She continues on anastrozole. She tolerates this well and without any noticeable side effects.  She pays about $5 per month for this medication.   Khristian's last bone density screening at [facility] on 01/05/2017, showed a T-score of -3.0,  which is considered osteoporosis  Since her last visit here, she underwent a digital diagnostic bilateral mammogram at [facility] on 11/22/2017 showing Breast Density Category B. There is no mammographic evidence for malignancy.    REVIEW OF SYSTEMS: On a normal day, Amberlin takes care of her dog and cleans her hour. For exercise, she walks occasionally. The patient denies unusual headaches, visual changes, nausea, vomiting, or dizziness. There has been no unusual cough, phlegm production, or pleurisy. This been no change in bowel or bladder habits. The patient denies unexplained fatigue or unexplained weight loss, bleeding, rash, or fever. A detailed review of systems was otherwise noncontributory.     PAST MEDICAL HISTORY: Past Medical History:  Diagnosis Date  . Alzheimer's disease ([place])   . Anxiety   . Breast cancer ([place])    left  . Depression   . GERD (gastroesophageal reflux disease)    occ  . High cholesterol   . Hypertension     PAST SURGICAL HISTORY: Past Surgical History:  Procedure Laterality Date  . BREAST LUMPECTOMY WITH RADIOACTIVE SEED AND SENTINEL LYMPH NODE BIOPSY Left 11/25/2016   Procedure: LEFT BREAST LUMPECTOMY WITH RADIOACTIVE SEED AND AXILLARY SENTINEL LYMPH NODE BIOPSY;  Surgeon: Excell Seltzer, MD;  Location: [place];  Service: General;  Laterality: Left;  . EYE SURGERY Bilateral    cataracts    FAMILY HISTORY: Family History  Problem Relation Age of Onset  . Colon cancer Mother   . Cancer Father   . Lung cancer Father   . Dementia Sister    The patient's father died at age 15 from lung cancer. The patient's mother died at age 32. The patient had 2 brothers, 1 sister. There is no history of breast or  ovarian cancer in the family.   GYNECOLOGIC HISTORY:  No LMP recorded. Patient is postmenopausal. Menarche age 17, first live birth age 51, she is [place] P2. She stopped having periods around June 2000. She never took hormone replacement. She used oral  contraceptives for approximately 9 years remotely with no complications.   SOCIAL HISTORY: (Updated 06/05/2018) Haeley is retired. She has 2 children from her first marriage: Web designer who is a Marine scientist working as a Occupational hygienist at [employer], and [employer], who also lives in [place] and works in Engineer, technical sales for [employer]. The patient has 3 biologic grandchildren and one great-grandchild. Her husband [name] has 2 children from his first marriage, a daughter in [place] and his son in [place]. They also share 5 great grandchildren. The patient attends a friend's church. Wilburn Cornelia and [name] live at [employer] in [place]. They have a schipperke dog.     ADVANCED DIRECTIVES: In place   HEALTH MAINTENANCE: Social History   Tobacco Use  . Smoking status: Never Smoker  . Smokeless tobacco: Never Used  Substance Use Topics  . Alcohol use: Yes    Alcohol/week: 3.0 standard drinks    Types: 3 Glasses of wine per week    Comment: 1-2 glasses of wine 2x/week  . Drug use: No     Colonoscopy:2007  PAP:  Bone density: Never   Allergies  Allergen Reactions  . Codeine Rash    Current Outpatient Medications  Medication Sig Dispense Refill  . anastrozole (ARIMIDEX) 1 MG tablet Take 1 tablet (1 mg total) by mouth daily. 90 tablet 4  . aspirin 81 MG tablet Take 81 mg by mouth daily.    Marland Kitchen buPROPion (WELLBUTRIN XL) 300 MG 24 hr tablet Take 300 mg by mouth every morning.  12  . donepezil (ARICEPT) 10 MG tablet Take 1 tablet (10 mg total) by mouth daily. 90 tablet 3  . lisinopril (PRINIVIL,ZESTRIL) 20 MG tablet Take 20 mg by mouth daily.  12  . memantine (NAMENDA) 10 MG tablet Take 10 mg by mouth 2 (two) times daily.    . Multiple Vitamin (MULTI-VITAMIN DAILY PO) Take 1 tablet by mouth daily.     . traZODone (DESYREL) 50 MG tablet Take 50 mg by mouth at bedtime.  12   No current facility-administered medications for this visit.      OBJECTIVE:Elderly white woman in no acute distress  Vitals:   06/05/18 1442  BP: (!) 149/67  Pulse: 86  Resp: 18  Temp: 97.9 F (36.6 C)  SpO2: 100%     Body mass index is 21.18 kg/m.  @.WEIGHTLAST3@    ECOG FS:0 - Asymptomatic   Sclerae unicteric, pupils round and equal No cervical or supraclavicular adenopathy Lungs no rales or rhonchi Heart regular rate and rhythm Abd soft, nontender, positive bowel sounds MSK no focal spinal tenderness, no upper extremity lymphedema Neuro: nonfocal, orientation not tested, pleasant affect Breasts: The right breast is benign.  The left breast has undergone lumpectomy.  There is no evidence of disease recurrence.  Both axillae are benign.    LAB RESULTS:  CMP     Component Value Date/Time   NA 141 08/08/2017 1417   NA 139 02/07/2017 1301   K 3.4 (L) 08/08/2017 1417   K 4.0 02/07/2017 1301   CL 105 08/08/2017 1417   CO2 27 08/08/2017 1417   CO2 26 02/07/2017 1301   GLUCOSE 103 08/08/2017 1417   GLUCOSE 98  02/07/2017 1301   BUN 13 08/08/2017 1417   BUN 18.3 02/07/2017 1301   CREATININE 0.99 08/08/2017 1417   CREATININE 1.2 (H) 02/07/2017 1301   CALCIUM 10.1 08/08/2017 1417   CALCIUM 10.2 02/07/2017 1301   PROT 6.9 08/08/2017 1417   PROT 7.5 02/07/2017 1301   ALBUMIN 4.2 08/08/2017 1417   ALBUMIN 4.3 02/07/2017 1301   AST 24 08/08/2017 1417   AST 23 02/07/2017 1301   ALT 24 08/08/2017 1417   ALT 24 02/07/2017 1301   ALKPHOS 71 08/08/2017 1417   ALKPHOS 64 02/07/2017 1301   BILITOT 0.3 08/08/2017 1417   BILITOT 0.45 02/07/2017 1301   GFRNONAA 52 (L) 08/08/2017 1417   GFRAA >60 08/08/2017 1417    No results found for: TOTALPROTELP, ALBUMINELP, A1GS, A2GS, BETS, BETA2SER, GAMS, MSPIKE, SPEI  No results found for: Nils Pyle, [facility]  Lab Results  Component Value Date   WBC 3.6 (L) 06/05/2018   NEUTROABS 2.4 06/05/2018   HGB 12.6 06/05/2018   HCT 38.6 06/05/2018   MCV 91.9 06/05/2018   PLT  133 (L) 06/05/2018      Chemistry      Component Value Date/Time   NA 141 08/08/2017 1417   NA 139 02/07/2017 1301   K 3.4 (L) 08/08/2017 1417   K 4.0 02/07/2017 1301   CL 105 08/08/2017 1417   CO2 27 08/08/2017 1417   CO2 26 02/07/2017 1301   BUN 13 08/08/2017 1417   BUN 18.3 02/07/2017 1301   CREATININE 0.99 08/08/2017 1417   CREATININE 1.2 (H) 02/07/2017 1301      Component Value Date/Time   CALCIUM 10.1 08/08/2017 1417   CALCIUM 10.2 02/07/2017 1301   ALKPHOS 71 08/08/2017 1417   ALKPHOS 64 02/07/2017 1301   AST 24 08/08/2017 1417   AST 23 02/07/2017 1301   ALT 24 08/08/2017 1417   ALT 24 02/07/2017 1301   BILITOT 0.3 08/08/2017 1417   BILITOT 0.45 02/07/2017 1301       No results found for: LABCA2  No components found for: TWSFKC127  No results for input(s): INR in the last 168 hours.  Urinalysis No results found for: COLORURINE, APPEARANCEUR, LABSPEC, PHURINE, GLUCOSEU, HGBUR, BILIRUBINUR, KETONESUR, PROTEINUR, UROBILINOGEN, NITRITE, LEUKOCYTESUR   STUDIES: No results found.   ELIGIBLE FOR AVAILABLE RESEARCH PROTOCOL: no   ASSESSMENT: 83 y.o. [place], [place] woman Status post left breast upper outer quadrant biopsy 10/11/2016 for a clinical T2 N0 invasive lobular carcinoma, E-cadherin negative, grade 2, 10 receptor strongly positive, progesterone receptor negative, with an MIB-1 of 10%. There was insufficient tissue for HER-2 evaluation.  (1) anastrozole started 10/20/2016  (a) Bone density on 01/05/2017 consistent with osteoporosis with a T score of -3.0 in the right femoral neck.  (b) offered denosumab/Prolia February 07, 2017--patient decided to wait  (2) status post left lumpectomy and sentinel lymph node sampling 11/25/2016 for a pT2 pN1, stage IIB invasive lobular carcinoma, grade 1, with close but negative margins.  (3) the patient opted against Mammaprint and adjuvant chemotherapy  (4) declined adjuvant radiation   PLAN: Sandralee is now a year  and a half out from definitive surgery for her breast cancer with no evidence of disease recurrence.  This is favorable.  She is tolerating anastrozole well and the plan will be to continue that a total of 5 years.  I encouraged her to continue to walk around the mall this is they tell me 4 times around his 1 mile) and I have started  her on vitamin D supplementation 1000 units daily  She will return to see me in September after her August mammogram.  From that point I will start seeing her on a once a year basis  She knows to call for any other issue that may develop before the next visit.   [name], [name], MD  06/05/18 3:28 PM Medical Oncology and Hematology [facility] [address] [place], [place] [address] Tel. [phone]    Fax. [phone]  I, Jacqualyn Posey am acting as a Education administrator for Chauncey Cruel, MD.   I, Lurline Del MD, have reviewed the above documentation for accuracy and completeness, and I agree with the above.

## 2018-06-05 ENCOUNTER — Telehealth: Payer: Self-pay | Admitting: Oncology

## 2018-06-05 ENCOUNTER — Inpatient Hospital Stay (HOSPITAL_BASED_OUTPATIENT_CLINIC_OR_DEPARTMENT_OTHER): Payer: Medicare HMO | Admitting: Oncology

## 2018-06-05 ENCOUNTER — Inpatient Hospital Stay: Payer: Medicare HMO | Attending: Oncology

## 2018-06-05 VITALS — BP 149/67 | HR 86 | Temp 97.9°F | Resp 18 | Ht 64.0 in | Wt 123.4 lb

## 2018-06-05 DIAGNOSIS — M810 Age-related osteoporosis without current pathological fracture: Secondary | ICD-10-CM | POA: Diagnosis not present

## 2018-06-05 DIAGNOSIS — Z79811 Long term (current) use of aromatase inhibitors: Secondary | ICD-10-CM | POA: Insufficient documentation

## 2018-06-05 DIAGNOSIS — Z170 Estrogen receptor positive status [ER+]: Secondary | ICD-10-CM | POA: Insufficient documentation

## 2018-06-05 DIAGNOSIS — Z79899 Other long term (current) drug therapy: Secondary | ICD-10-CM | POA: Insufficient documentation

## 2018-06-05 DIAGNOSIS — M8580 Other specified disorders of bone density and structure, unspecified site: Secondary | ICD-10-CM

## 2018-06-05 DIAGNOSIS — I10 Essential (primary) hypertension: Secondary | ICD-10-CM | POA: Insufficient documentation

## 2018-06-05 DIAGNOSIS — C50412 Malignant neoplasm of upper-outer quadrant of left female breast: Secondary | ICD-10-CM | POA: Diagnosis not present

## 2018-06-05 DIAGNOSIS — Z7982 Long term (current) use of aspirin: Secondary | ICD-10-CM | POA: Insufficient documentation

## 2018-06-05 LAB — CBC WITH DIFFERENTIAL/PLATELET
Abs Immature Granulocytes: 0.01 10*3/uL (ref 0.00–0.07)
BASOS PCT: 0 %
Basophils Absolute: 0 10*3/uL (ref 0.0–0.1)
EOS PCT: 2 %
Eosinophils Absolute: 0.1 10*3/uL (ref 0.0–0.5)
HCT: 38.6 % (ref 36.0–46.0)
HEMOGLOBIN: 12.6 g/dL (ref 12.0–15.0)
Immature Granulocytes: 0 %
LYMPHS PCT: 22 %
Lymphs Abs: 0.8 10*3/uL (ref 0.7–4.0)
MCH: 30 pg (ref 26.0–34.0)
MCHC: 32.6 g/dL (ref 30.0–36.0)
MCV: 91.9 fL (ref 80.0–100.0)
MONO ABS: 0.3 10*3/uL (ref 0.1–1.0)
Monocytes Relative: 9 %
NRBC: 0 % (ref 0.0–0.2)
Neutro Abs: 2.4 10*3/uL (ref 1.7–7.7)
Neutrophils Relative %: 67 %
PLATELETS: 133 10*3/uL — AB (ref 150–400)
RBC: 4.2 MIL/uL (ref 3.87–5.11)
RDW: 12.9 % (ref 11.5–15.5)
WBC: 3.6 10*3/uL — AB (ref 4.0–10.5)

## 2018-06-05 LAB — COMPREHENSIVE METABOLIC PANEL
ALT: 17 U/L (ref 0–44)
AST: 22 U/L (ref 15–41)
Albumin: 4.3 g/dL (ref 3.5–5.0)
Alkaline Phosphatase: 64 U/L (ref 38–126)
Anion gap: 9 (ref 5–15)
BILIRUBIN TOTAL: 0.5 mg/dL (ref 0.3–1.2)
BUN: 12 mg/dL (ref 8–23)
CHLORIDE: 104 mmol/L (ref 98–111)
CO2: 28 mmol/L (ref 22–32)
Calcium: 9.4 mg/dL (ref 8.9–10.3)
Creatinine, Ser: 1.13 mg/dL — ABNORMAL HIGH (ref 0.44–1.00)
GFR, EST AFRICAN AMERICAN: 53 mL/min — AB (ref >60)
GFR, EST NON AFRICAN AMERICAN: 46 mL/min — AB (ref >60)
Glucose, Bld: 117 mg/dL — ABNORMAL HIGH (ref 70–99)
Potassium: 3.6 mmol/L (ref 3.5–5.1)
Sodium: 141 mmol/L (ref 135–145)
TOTAL PROTEIN: 7.4 g/dL (ref 6.5–8.1)

## 2018-06-05 MED ORDER — VITAMIN D 25 MCG (1000 UNIT) PO TABS: 1000.0000 [IU] | ORAL_TABLET | Freq: Every day | ORAL | 4 refills | Status: DC

## 2018-06-05 MED ORDER — ANASTROZOLE 1 MG PO TABS: 1.0000 mg | ORAL_TABLET | Freq: Every day | ORAL | 4 refills | Status: DC

## 2018-06-05 NOTE — Telephone Encounter (Signed)
Gave avs and calendar ° °

## 2018-06-12 ENCOUNTER — Other Ambulatory Visit: Payer: Self-pay | Admitting: Oncology

## 2018-06-14 DIAGNOSIS — K30 Functional dyspepsia: Secondary | ICD-10-CM | POA: Diagnosis not present

## 2018-06-14 DIAGNOSIS — R69 Illness, unspecified: Secondary | ICD-10-CM | POA: Diagnosis not present

## 2018-08-29 DIAGNOSIS — J342 Deviated nasal septum: Secondary | ICD-10-CM | POA: Diagnosis not present

## 2018-08-29 DIAGNOSIS — R51 Headache: Secondary | ICD-10-CM | POA: Diagnosis not present

## 2018-08-29 DIAGNOSIS — J310 Chronic rhinitis: Secondary | ICD-10-CM | POA: Diagnosis not present

## 2018-08-30 DIAGNOSIS — M79641 Pain in right hand: Secondary | ICD-10-CM | POA: Diagnosis not present

## 2018-08-30 DIAGNOSIS — M65831 Other synovitis and tenosynovitis, right forearm: Secondary | ICD-10-CM | POA: Diagnosis not present

## 2018-10-02 ENCOUNTER — Telehealth: Payer: Self-pay | Admitting: Adult Health

## 2018-10-02 NOTE — Telephone Encounter (Signed)
Pt's husband called stating that the pt has severely gone "down hill" and would like to be seen sooner but there is no availability in the month of July like he requested. Please advise.

## 2018-10-03 ENCOUNTER — Encounter: Payer: Self-pay | Admitting: Adult Health

## 2018-10-03 ENCOUNTER — Other Ambulatory Visit: Payer: Self-pay

## 2018-10-03 ENCOUNTER — Ambulatory Visit (INDEPENDENT_AMBULATORY_CARE_PROVIDER_SITE_OTHER): Payer: Medicare HMO | Admitting: Adult Health

## 2018-10-03 VITALS — BP 195/87 | HR 64 | Temp 97.7°F | Ht 64.0 in | Wt 117.0 lb

## 2018-10-03 DIAGNOSIS — R69 Illness, unspecified: Secondary | ICD-10-CM | POA: Diagnosis not present

## 2018-10-03 DIAGNOSIS — F0280 Dementia in other diseases classified elsewhere without behavioral disturbance: Secondary | ICD-10-CM

## 2018-10-03 DIAGNOSIS — G301 Alzheimer's disease with late onset: Secondary | ICD-10-CM

## 2018-10-03 NOTE — Progress Notes (Signed)
PATIENT: Shelly Cervantes DOB: 16-Jun-1935  REASON FOR VISIT: follow up HISTORY FROM: patient  HISTORY OF PRESENT ILLNESS: Today 10/03/18:  Shelly Cervantes is an 83 year old female with a history of memory disturbance.  She returns today for follow-up.  She remains on Aricept and Namenda.  She is tolerating well medications well.  She requires some assistance with ADLs.  She no longer does any cooking.  Her husband manages the finances.  She does not operate a motor vehicle.  The husband reports some mood changes but it is manageable at this time.  He reports that she often complains of pain across both maxillary sinuses.  She can stay compliant with right before they are supposed to go eat with other people.  She has seen Dr. Wilburn Cornelia and reportedly her sinuses are clear.  Husband has also noticed that she sleeps a lot during the day.  She continues to sleep well at night.  He is also noticed that she is more forgetful.    REVIEW OF SYSTEMS: Out of a complete 14 system review of symptoms, the patient complains only of the following symptoms, and all other reviewed systems are negative.  See HPI  ALLERGIES: Allergies  Allergen Reactions  . Codeine Rash    HOME MEDICATIONS: Outpatient Medications Prior to Visit  Medication Sig Dispense Refill  . anastrozole (ARIMIDEX) 1 MG tablet Take 1 tablet (1 mg total) by mouth daily. 90 tablet 4  . aspirin 81 MG tablet Take 81 mg by mouth daily.    Marland Kitchen buPROPion (WELLBUTRIN XL) 300 MG 24 hr tablet Take 300 mg by mouth every morning.  12  . cholecalciferol (VITAMIN D3) 25 MCG (1000 UT) tablet Take 1 tablet (1,000 Units total) by mouth daily. 100 tablet 4  . donepezil (ARICEPT) 10 MG tablet Take 1 tablet (10 mg total) by mouth daily. 90 tablet 3  . lisinopril (PRINIVIL,ZESTRIL) 20 MG tablet Take 20 mg by mouth daily.  12  . memantine (NAMENDA) 10 MG tablet Take 10 mg by mouth 2 (two) times daily.    . Multiple Vitamin (MULTI-VITAMIN DAILY PO) Take 1  tablet by mouth daily.     . traZODone (DESYREL) 50 MG tablet Take 50 mg by mouth at bedtime.  12   No facility-administered medications prior to visit.     PAST MEDICAL HISTORY: Past Medical History:  Diagnosis Date  . Alzheimer's disease ([place])   . Anxiety   . Breast cancer ([place])    left  . Depression   . GERD (gastroesophageal reflux disease)    occ  . High cholesterol   . Hypertension     PAST SURGICAL HISTORY: Past Surgical History:  Procedure Laterality Date  . BREAST LUMPECTOMY WITH RADIOACTIVE SEED AND SENTINEL LYMPH NODE BIOPSY Left 11/25/2016   Procedure: LEFT BREAST LUMPECTOMY WITH RADIOACTIVE SEED AND AXILLARY SENTINEL LYMPH NODE BIOPSY;  Surgeon: Excell Seltzer, MD;  Location: [place];  Service: General;  Laterality: Left;  . EYE SURGERY Bilateral    cataracts    FAMILY HISTORY: Family History  Problem Relation Age of Onset  . Colon cancer Mother   . Cancer Father   . Lung cancer Father   . Dementia Sister     SOCIAL HISTORY: Social History   Socioeconomic History  . Marital status: Married    Spouse name: Not on file  . Number of children: 2  . Years of education: 2  . Highest education level: Not on file  Occupational History  .  Occupation: Retired   Scientific laboratory technician  . Financial resource strain: Not on file  . Food insecurity    Worry: Not on file    Inability: Not on file  . Transportation needs    Medical: Not on file    Non-medical: Not on file  Tobacco Use  . Smoking status: Never Smoker  . Smokeless tobacco: Never Used  Substance and Sexual Activity  . Alcohol use: Yes    Alcohol/week: 3.0 standard drinks    Types: 3 Glasses of wine per week    Comment: 1-2 glasses of wine 2x/week  . Drug use: No  . Sexual activity: Not on file  Lifestyle  . Physical activity    Days per week: Not on file    Minutes per session: Not on file  . Stress: Not on file  Relationships  . Social Herbalist on phone: Not on file    Gets  together: Not on file    Attends religious service: Not on file    Active member of club or organization: Not on file    Attends meetings of clubs or organizations: Not on file    Relationship status: Not on file  . Intimate partner violence    Fear of current or ex partner: Not on file    Emotionally abused: Not on file    Physically abused: Not on file    Forced sexual activity: Not on file  Other Topics Concern  . Not on file  Social History Narrative   Denies caffeine use       PHYSICAL EXAM  Vitals:   10/03/18 1340  BP: (!) 195/87  Pulse: 64  Temp: 97.7 F (36.5 C)  TempSrc: Oral  Weight: 117 lb (53.1 kg)  Height: 5\' 4"  (1.626 m)   Body mass index is 20.08 kg/m.   MMSE - Mini Mental State Exam 05/04/2018 10/31/2017 06/17/2016  Orientation to time 2 0 2  Orientation to Place 3 4 4   Registration 3 3 3   Attention/ Calculation 4 5 5   Recall 0 0 0  Language- name 2 objects 2 2 2   Language- repeat 1 1 1   Language- follow 3 step command 3 2 3   Language- read & follow direction 1 1 1   Write a sentence 1 1 1   Copy design 1 0 1  Total score 21 19 23      Generalized: Well developed, in no acute distress   Neurological examination  Mentation: Alert . Follows all commands speech and language fluent Cranial nerve II-XII: Pupils were equal round reactive to light. Extraocular movements were full, visual field were full on confrontational test. Facial sensation and strength were normal. Uvula tongue midline. Head turning and shoulder shrug  were normal and symmetric. Motor: The motor testing reveals 5 over 5 strength of all 4 extremities. Good symmetric motor tone is noted throughout.  Sensory: Sensory testing is intact to soft touch on all 4 extremities. No evidence of extinction is noted.  Coordination: Cerebellar testing reveals good finger-nose-finger and heel-to-shin bilaterally.  Gait and station: Gait is slightly wide-based.  Tandem gait not attempted.   DIAGNOSTIC  DATA (LABS, IMAGING, TESTING) - I reviewed patient records, labs, notes, testing and imaging myself where available.  Lab Results  Component Value Date   WBC 3.6 (L) 06/05/2018   HGB 12.6 06/05/2018   HCT 38.6 06/05/2018   MCV 91.9 06/05/2018   PLT 133 (L) 06/05/2018      Component Value  Date/Time   NA 141 06/05/2018 1426   NA 139 02/07/2017 1301   K 3.6 06/05/2018 1426   K 4.0 02/07/2017 1301   CL 104 06/05/2018 1426   CO2 28 06/05/2018 1426   CO2 26 02/07/2017 1301   GLUCOSE 117 (H) 06/05/2018 1426   GLUCOSE 98 02/07/2017 1301   BUN 12 06/05/2018 1426   BUN 18.3 02/07/2017 1301   CREATININE 1.13 (H) 06/05/2018 1426   CREATININE 1.2 (H) 02/07/2017 1301   CALCIUM 9.4 06/05/2018 1426   CALCIUM 10.2 02/07/2017 1301   PROT 7.4 06/05/2018 1426   PROT 7.5 02/07/2017 1301   ALBUMIN 4.3 06/05/2018 1426   ALBUMIN 4.3 02/07/2017 1301   AST 22 06/05/2018 1426   AST 23 02/07/2017 1301   ALT 17 06/05/2018 1426   ALT 24 02/07/2017 1301   ALKPHOS 64 06/05/2018 1426   ALKPHOS 64 02/07/2017 1301   BILITOT 0.5 06/05/2018 1426   BILITOT 0.45 02/07/2017 1301   GFRNONAA 46 (L) 06/05/2018 1426   GFRAA 53 (L) 06/05/2018 1426      ASSESSMENT AND PLAN 83 y.o. year old female  has a past medical history of Alzheimer's disease ([place]), Anxiety, Breast cancer ([place]), Depression, GERD (gastroesophageal reflux disease), High cholesterol, and Hypertension. here with:  1.  Alzheimer's disease  The patient memory for has declined since the last visit.  She will continue on Aricept and Namenda.  I have encouraged the patient and her husband to monitor her weight.  If she continues note weight loss. Ariveptt may need to be decreased or discontinued.  Her husband is questioning whether another MRI of the brain is necessary.  Her physical exam was relatively unremarkable with the exception to  memory score.  I do not feel that an MRI of the brain is warranted at this time.  However if her symptoms worsen  or she develops new symptoms they should let us know.  She will follow-up in 4 months or sooner if needed  I spent 25 minutes with the patient this time was spent discussing progression of dementia and treatment options.   Ward Givens, MSN, NP-C 10/03/2018, 1:52 PM [facility] [address], [place], [place] [address] [phone]

## 2018-10-03 NOTE — Telephone Encounter (Signed)
That's fine

## 2018-10-03 NOTE — Telephone Encounter (Signed)
Spoke with the patient's husband and he has agreed to bring her in the office for a visit with Megan @ 1:30 today. He was very appreciative for the call.

## 2018-10-03 NOTE — Patient Instructions (Addendum)
Your Plan:  Continue Aricept and Namenda Monitor weight If your symptoms worsen or you develop new symptoms please let us know.    Thank you for coming to see [place] at [facility]. I hope we have been able to provide you high quality care today.  You may receive a patient satisfaction survey over the next few weeks. We would appreciate your feedback and comments so that we may continue to improve ourselves and the health of our patients.

## 2018-11-29 DIAGNOSIS — E46 Unspecified protein-calorie malnutrition: Secondary | ICD-10-CM | POA: Diagnosis not present

## 2018-11-29 DIAGNOSIS — R634 Abnormal weight loss: Secondary | ICD-10-CM | POA: Diagnosis not present

## 2018-11-29 DIAGNOSIS — R2689 Other abnormalities of gait and mobility: Secondary | ICD-10-CM | POA: Diagnosis not present

## 2018-11-29 DIAGNOSIS — R69 Illness, unspecified: Secondary | ICD-10-CM | POA: Diagnosis not present

## 2018-11-30 ENCOUNTER — Encounter

## 2018-11-30 ENCOUNTER — Telehealth: Payer: Self-pay | Admitting: Adult Health

## 2018-11-30 ENCOUNTER — Ambulatory Visit: Payer: Medicare HMO | Admitting: Adult Health

## 2018-11-30 ENCOUNTER — Other Ambulatory Visit: Payer: Self-pay | Admitting: *Deleted

## 2018-11-30 NOTE — Telephone Encounter (Signed)
Spoke to pts husband and made appt with AL/NP due to availability 12-07-18 at 1300, arrive 1230.   Feels that ALZ dementia worsening.  She is taking megace now for appetite.  Down to 103lbs.  Was uncooperative today for coming to appt.

## 2018-11-30 NOTE — Telephone Encounter (Signed)
I called and LMVM for pt husband that was returning call.

## 2018-11-30 NOTE — Telephone Encounter (Signed)
Pt's husband called stating that they will not be able to come to today's appt due to the pt not cooperating and getting dressed for the appt. Pt's husband mentioned this has happened for other appts in other offices as well and that she claims that her sinuses are bothering her and that is why she does not want to come but he thinks it is all psychological. He would like to be advised on what other options are out there for help with her since she is worsening. Please advise.

## 2018-12-04 DIAGNOSIS — G4700 Insomnia, unspecified: Secondary | ICD-10-CM | POA: Diagnosis not present

## 2018-12-04 DIAGNOSIS — R634 Abnormal weight loss: Secondary | ICD-10-CM | POA: Diagnosis not present

## 2018-12-04 DIAGNOSIS — R69 Illness, unspecified: Secondary | ICD-10-CM | POA: Diagnosis not present

## 2018-12-04 DIAGNOSIS — I10 Essential (primary) hypertension: Secondary | ICD-10-CM | POA: Diagnosis not present

## 2018-12-04 DIAGNOSIS — F329 Major depressive disorder, single episode, unspecified: Secondary | ICD-10-CM | POA: Diagnosis not present

## 2018-12-04 DIAGNOSIS — R2689 Other abnormalities of gait and mobility: Secondary | ICD-10-CM | POA: Diagnosis not present

## 2018-12-04 DIAGNOSIS — H409 Unspecified glaucoma: Secondary | ICD-10-CM | POA: Diagnosis not present

## 2018-12-04 DIAGNOSIS — Z9181 History of falling: Secondary | ICD-10-CM | POA: Diagnosis not present

## 2018-12-04 DIAGNOSIS — Z681 Body mass index (BMI) 19 or less, adult: Secondary | ICD-10-CM | POA: Diagnosis not present

## 2018-12-04 DIAGNOSIS — E46 Unspecified protein-calorie malnutrition: Secondary | ICD-10-CM | POA: Diagnosis not present

## 2018-12-05 DIAGNOSIS — R69 Illness, unspecified: Secondary | ICD-10-CM | POA: Diagnosis not present

## 2018-12-05 DIAGNOSIS — F329 Major depressive disorder, single episode, unspecified: Secondary | ICD-10-CM | POA: Diagnosis not present

## 2018-12-05 DIAGNOSIS — H409 Unspecified glaucoma: Secondary | ICD-10-CM | POA: Diagnosis not present

## 2018-12-05 DIAGNOSIS — R2689 Other abnormalities of gait and mobility: Secondary | ICD-10-CM | POA: Diagnosis not present

## 2018-12-05 DIAGNOSIS — Z9181 History of falling: Secondary | ICD-10-CM | POA: Diagnosis not present

## 2018-12-05 DIAGNOSIS — G4700 Insomnia, unspecified: Secondary | ICD-10-CM | POA: Diagnosis not present

## 2018-12-05 DIAGNOSIS — Z681 Body mass index (BMI) 19 or less, adult: Secondary | ICD-10-CM | POA: Diagnosis not present

## 2018-12-05 DIAGNOSIS — R634 Abnormal weight loss: Secondary | ICD-10-CM | POA: Diagnosis not present

## 2018-12-05 DIAGNOSIS — E46 Unspecified protein-calorie malnutrition: Secondary | ICD-10-CM | POA: Diagnosis not present

## 2018-12-05 DIAGNOSIS — I10 Essential (primary) hypertension: Secondary | ICD-10-CM | POA: Diagnosis not present

## 2018-12-07 ENCOUNTER — Ambulatory Visit: Payer: Self-pay | Admitting: Family Medicine

## 2018-12-07 DIAGNOSIS — F0280 Dementia in other diseases classified elsewhere without behavioral disturbance: Secondary | ICD-10-CM | POA: Insufficient documentation

## 2018-12-07 DIAGNOSIS — G301 Alzheimer's disease with late onset: Secondary | ICD-10-CM | POA: Insufficient documentation

## 2018-12-07 NOTE — Progress Notes (Deleted)
PATIENT: Shelly Cervantes DOB: Mar 01, 1936  REASON FOR VISIT: follow up HISTORY FROM: patient  No chief complaint on file.    HISTORY OF PRESENT ILLNESS: Today 12/07/18 Shelly Cervantes is a 83 y.o. female here today for follow up of AD.    HISTORY: (copied from [place] note on 10/03/2018)  Shelly Cervantes is an 83 year old female with a history of memory disturbance.  She returns today for follow-up.  She remains on Aricept and Namenda.  She is tolerating well medications well.  She requires some assistance with ADLs.  She no longer does any cooking.  Her husband manages the finances.  She does not operate a motor vehicle.  The husband reports some mood changes but it is manageable at this time.  He reports that she often complains of pain across both maxillary sinuses.  She can stay compliant with right before they are supposed to go eat with other people.  She has seen Dr. Wilburn Cornelia and reportedly her sinuses are clear.  Husband has also noticed that she sleeps a lot during the day.  She continues to sleep well at night.  He is also noticed that she is more forgetful.   REVIEW OF SYSTEMS: Out of a complete 14 system review of symptoms, the patient complains only of the following symptoms, and all other reviewed systems are negative.  ALLERGIES: Allergies  Allergen Reactions  . Codeine Rash    HOME MEDICATIONS: Outpatient Medications Prior to Visit  Medication Sig Dispense Refill  . anastrozole (ARIMIDEX) 1 MG tablet Take 1 tablet (1 mg total) by mouth daily. 90 tablet 4  . aspirin 81 MG tablet Take 81 mg by mouth daily.    Marland Kitchen buPROPion (WELLBUTRIN XL) 300 MG 24 hr tablet Take 300 mg by mouth every morning.  12  . cholecalciferol (VITAMIN D3) 25 MCG (1000 UT) tablet Take 1 tablet (1,000 Units total) by mouth daily. 100 tablet 4  . donepezil (ARICEPT) 10 MG tablet Take 1 tablet (10 mg total) by mouth daily. 90 tablet 3  . lisinopril (PRINIVIL,ZESTRIL) 20 MG tablet Take 20 mg by  mouth daily.  12  . megestrol (MEGACE) 20 MG tablet 1 2 TABLETS TWICE A DAY ORALLY 30 DAY(S)    . memantine (NAMENDA) 10 MG tablet Take 10 mg by mouth 2 (two) times daily.    . Multiple Vitamin (MULTI-VITAMIN DAILY PO) Take 1 tablet by mouth daily.     . traZODone (DESYREL) 50 MG tablet Take 50 mg by mouth at bedtime.  12   No facility-administered medications prior to visit.     PAST MEDICAL HISTORY: Past Medical History:  Diagnosis Date  . Alzheimer's disease ([place])   . Anxiety   . Breast cancer ([place])    left  . Depression   . GERD (gastroesophageal reflux disease)    occ  . High cholesterol   . Hypertension     PAST SURGICAL HISTORY: Past Surgical History:  Procedure Laterality Date  . BREAST LUMPECTOMY WITH RADIOACTIVE SEED AND SENTINEL LYMPH NODE BIOPSY Left 11/25/2016   Procedure: LEFT BREAST LUMPECTOMY WITH RADIOACTIVE SEED AND AXILLARY SENTINEL LYMPH NODE BIOPSY;  Surgeon: Excell Seltzer, MD;  Location: [facility];  Service: General;  Laterality: Left;  . EYE SURGERY Bilateral    cataracts    FAMILY HISTORY: Family History  Problem Relation Age of Onset  . Colon cancer Mother   . Cancer Father   . Lung cancer Father   . Dementia Sister  SOCIAL HISTORY: Social History   Socioeconomic History  . Marital status: Married    Spouse name: Not on file  . Number of children: 2  . Years of education: 2  . Highest education level: Not on file  Occupational History  . Occupation: Retired   Scientific laboratory technician  . Financial resource strain: Not on file  . Food insecurity    Worry: Not on file    Inability: Not on file  . Transportation needs    Medical: Not on file    Non-medical: Not on file  Tobacco Use  . Smoking status: Never Smoker  . Smokeless tobacco: Never Used  Substance and Sexual Activity  . Alcohol use: Yes    Alcohol/week: 3.0 standard drinks    Types: 3 Glasses of wine per week    Comment: 1-2 glasses of wine 2x/week  . Drug use: No  . Sexual  activity: Not on file  Lifestyle  . Physical activity    Days per week: Not on file    Minutes per session: Not on file  . Stress: Not on file  Relationships  . Social Herbalist on phone: Not on file    Gets together: Not on file    Attends religious service: Not on file    Active member of club or organization: Not on file    Attends meetings of clubs or organizations: Not on file    Relationship status: Not on file  . Intimate partner violence    Fear of current or ex partner: Not on file    Emotionally abused: Not on file    Physically abused: Not on file    Forced sexual activity: Not on file  Other Topics Concern  . Not on file  Social History Narrative   Denies caffeine use       PHYSICAL EXAM  There were no vitals filed for this visit. There is no height or weight on file to calculate BMI.  Generalized: Well developed, in no acute distress  Cardiology: normal rate and rhythm, no murmur noted Neurological examination  Mentation: Alert oriented to time, place, history taking. Follows all commands speech and language fluent Cranial nerve II-XII: Pupils were equal round reactive to light. Extraocular movements were full, visual field were full on confrontational test. Facial sensation and strength were normal. Uvula tongue midline. Head turning and shoulder shrug  were normal and symmetric. Motor: The motor testing reveals 5 over 5 strength of all 4 extremities. Good symmetric motor tone is noted throughout.  Sensory: Sensory testing is intact to soft touch on all 4 extremities. No evidence of extinction is noted.  Coordination: Cerebellar testing reveals good finger-nose-finger and heel-to-shin bilaterally.  Gait and station: Gait is normal. Tandem gait is normal. Romberg is negative. No drift is seen.  Reflexes: Deep tendon reflexes are symmetric and normal bilaterally.   DIAGNOSTIC DATA (LABS, IMAGING, TESTING) - I reviewed patient records, labs, notes,  testing and imaging myself where available.  MMSE - Mini Mental State Exam 10/03/2018 05/04/2018 10/31/2017  Not completed: (No Data) - -  Orientation to time 0 2 0  Orientation to Place 3 3 4   Registration 3 3 3   Attention/ Calculation 2 4 5   Recall 0 0 0  Language- name 2 objects 2 2 2   Language- repeat 1 1 1   Language- follow 3 step command 2 3 2   Language- read & follow direction 1 1 1   Write a sentence 1 1  1  Copy design 1 1 0  Total score 16 21 19      Lab Results  Component Value Date   WBC 3.6 (L) 06/05/2018   HGB 12.6 06/05/2018   HCT 38.6 06/05/2018   MCV 91.9 06/05/2018   PLT 133 (L) 06/05/2018      Component Value Date/Time   NA 141 06/05/2018 1426   NA 139 02/07/2017 1301   K 3.6 06/05/2018 1426   K 4.0 02/07/2017 1301   CL 104 06/05/2018 1426   CO2 28 06/05/2018 1426   CO2 26 02/07/2017 1301   GLUCOSE 117 (H) 06/05/2018 1426   GLUCOSE 98 02/07/2017 1301   BUN 12 06/05/2018 1426   BUN 18.3 02/07/2017 1301   CREATININE 1.13 (H) 06/05/2018 1426   CREATININE 1.2 (H) 02/07/2017 1301   CALCIUM 9.4 06/05/2018 1426   CALCIUM 10.2 02/07/2017 1301   PROT 7.4 06/05/2018 1426   PROT 7.5 02/07/2017 1301   ALBUMIN 4.3 06/05/2018 1426   ALBUMIN 4.3 02/07/2017 1301   AST 22 06/05/2018 1426   AST 23 02/07/2017 1301   ALT 17 06/05/2018 1426   ALT 24 02/07/2017 1301   ALKPHOS 64 06/05/2018 1426   ALKPHOS 64 02/07/2017 1301   BILITOT 0.5 06/05/2018 1426   BILITOT 0.45 02/07/2017 1301   GFRNONAA 46 (L) 06/05/2018 1426   GFRAA 53 (L) 06/05/2018 1426   No results found for: CHOL, HDL, LDLCALC, LDLDIRECT, TRIG, CHOLHDL No results found for: HGBA1C No results found for: VITAMINB12 No results found for: TSH     ASSESSMENT AND PLAN 83 y.o. year old female  has a past medical history of Alzheimer's disease ([place]), Anxiety, Breast cancer ([place]), Depression, GERD (gastroesophageal reflux disease), High cholesterol, and Hypertension. here with ***    ICD-10-CM   1. Late  onset Alzheimer's disease without behavioral disturbance ([place])  G30.1    F02.80        No orders of the defined types were placed in this encounter.    No orders of the defined types were placed in this encounter.     I spent 15 minutes with the patient. 50% of this time was spent counseling and educating patient on plan of care and medications.    Debbora Presto, FNP-C 12/07/2018, 8:00 AM [facility] [address], [place], [place] [address] [phone]

## 2018-12-13 DIAGNOSIS — I10 Essential (primary) hypertension: Secondary | ICD-10-CM | POA: Diagnosis not present

## 2018-12-13 DIAGNOSIS — R634 Abnormal weight loss: Secondary | ICD-10-CM | POA: Diagnosis not present

## 2018-12-13 DIAGNOSIS — Z9181 History of falling: Secondary | ICD-10-CM | POA: Diagnosis not present

## 2018-12-13 DIAGNOSIS — E46 Unspecified protein-calorie malnutrition: Secondary | ICD-10-CM | POA: Diagnosis not present

## 2018-12-13 DIAGNOSIS — R2689 Other abnormalities of gait and mobility: Secondary | ICD-10-CM | POA: Diagnosis not present

## 2018-12-13 DIAGNOSIS — G4700 Insomnia, unspecified: Secondary | ICD-10-CM | POA: Diagnosis not present

## 2018-12-13 DIAGNOSIS — H409 Unspecified glaucoma: Secondary | ICD-10-CM | POA: Diagnosis not present

## 2018-12-13 DIAGNOSIS — F329 Major depressive disorder, single episode, unspecified: Secondary | ICD-10-CM | POA: Diagnosis not present

## 2018-12-13 DIAGNOSIS — Z681 Body mass index (BMI) 19 or less, adult: Secondary | ICD-10-CM | POA: Diagnosis not present

## 2018-12-13 DIAGNOSIS — R69 Illness, unspecified: Secondary | ICD-10-CM | POA: Diagnosis not present

## 2018-12-14 DIAGNOSIS — R69 Illness, unspecified: Secondary | ICD-10-CM | POA: Diagnosis not present

## 2018-12-14 DIAGNOSIS — E162 Hypoglycemia, unspecified: Secondary | ICD-10-CM | POA: Diagnosis not present

## 2018-12-14 DIAGNOSIS — R2689 Other abnormalities of gait and mobility: Secondary | ICD-10-CM | POA: Diagnosis not present

## 2018-12-14 DIAGNOSIS — Z0000 Encounter for general adult medical examination without abnormal findings: Secondary | ICD-10-CM | POA: Diagnosis not present

## 2018-12-14 DIAGNOSIS — I10 Essential (primary) hypertension: Secondary | ICD-10-CM | POA: Diagnosis not present

## 2018-12-14 DIAGNOSIS — G250 Essential tremor: Secondary | ICD-10-CM | POA: Diagnosis not present

## 2018-12-14 DIAGNOSIS — E46 Unspecified protein-calorie malnutrition: Secondary | ICD-10-CM | POA: Diagnosis not present

## 2018-12-14 DIAGNOSIS — G479 Sleep disorder, unspecified: Secondary | ICD-10-CM | POA: Diagnosis not present

## 2018-12-14 DIAGNOSIS — E7800 Pure hypercholesterolemia, unspecified: Secondary | ICD-10-CM | POA: Diagnosis not present

## 2018-12-15 DIAGNOSIS — E46 Unspecified protein-calorie malnutrition: Secondary | ICD-10-CM | POA: Diagnosis not present

## 2018-12-15 DIAGNOSIS — G4700 Insomnia, unspecified: Secondary | ICD-10-CM | POA: Diagnosis not present

## 2018-12-15 DIAGNOSIS — R634 Abnormal weight loss: Secondary | ICD-10-CM | POA: Diagnosis not present

## 2018-12-15 DIAGNOSIS — R69 Illness, unspecified: Secondary | ICD-10-CM | POA: Diagnosis not present

## 2018-12-15 DIAGNOSIS — F329 Major depressive disorder, single episode, unspecified: Secondary | ICD-10-CM | POA: Diagnosis not present

## 2018-12-15 DIAGNOSIS — R2689 Other abnormalities of gait and mobility: Secondary | ICD-10-CM | POA: Diagnosis not present

## 2018-12-15 DIAGNOSIS — Z9181 History of falling: Secondary | ICD-10-CM | POA: Diagnosis not present

## 2018-12-15 DIAGNOSIS — H409 Unspecified glaucoma: Secondary | ICD-10-CM | POA: Diagnosis not present

## 2018-12-15 DIAGNOSIS — I10 Essential (primary) hypertension: Secondary | ICD-10-CM | POA: Diagnosis not present

## 2018-12-15 DIAGNOSIS — Z681 Body mass index (BMI) 19 or less, adult: Secondary | ICD-10-CM | POA: Diagnosis not present

## 2018-12-18 NOTE — Progress Notes (Signed)
No show

## 2018-12-19 ENCOUNTER — Inpatient Hospital Stay: Payer: Medicare HMO

## 2018-12-19 ENCOUNTER — Inpatient Hospital Stay: Payer: Medicare HMO | Attending: Oncology | Admitting: Oncology

## 2018-12-19 ENCOUNTER — Encounter: Payer: Self-pay | Admitting: Oncology

## 2018-12-19 DIAGNOSIS — Z170 Estrogen receptor positive status [ER+]: Secondary | ICD-10-CM

## 2018-12-19 DIAGNOSIS — C50412 Malignant neoplasm of upper-outer quadrant of left female breast: Secondary | ICD-10-CM

## 2018-12-25 ENCOUNTER — Emergency Department (HOSPITAL_BASED_OUTPATIENT_CLINIC_OR_DEPARTMENT_OTHER): Payer: Medicare HMO

## 2018-12-25 ENCOUNTER — Encounter (HOSPITAL_BASED_OUTPATIENT_CLINIC_OR_DEPARTMENT_OTHER): Payer: Self-pay | Admitting: *Deleted

## 2018-12-25 ENCOUNTER — Emergency Department (HOSPITAL_BASED_OUTPATIENT_CLINIC_OR_DEPARTMENT_OTHER)
Admission: EM | Admit: 2018-12-25 | Discharge: 2018-12-25 | Disposition: A | Discharge status: Home / Self Care | Payer: Medicare HMO | Attending: Emergency Medicine | Admitting: Emergency Medicine

## 2018-12-25 ENCOUNTER — Other Ambulatory Visit: Payer: Self-pay

## 2018-12-25 DIAGNOSIS — Y929 Unspecified place or not applicable: Secondary | ICD-10-CM | POA: Insufficient documentation

## 2018-12-25 DIAGNOSIS — I10 Essential (primary) hypertension: Secondary | ICD-10-CM | POA: Diagnosis not present

## 2018-12-25 DIAGNOSIS — W010XXA Fall on same level from slipping, tripping and stumbling without subsequent striking against object, initial encounter: Secondary | ICD-10-CM | POA: Diagnosis not present

## 2018-12-25 DIAGNOSIS — R42 Dizziness and giddiness: Secondary | ICD-10-CM | POA: Diagnosis not present

## 2018-12-25 DIAGNOSIS — Y999 Unspecified external cause status: Secondary | ICD-10-CM | POA: Diagnosis not present

## 2018-12-25 DIAGNOSIS — M25572 Pain in left ankle and joints of left foot: Secondary | ICD-10-CM | POA: Diagnosis not present

## 2018-12-25 DIAGNOSIS — Y939 Activity, unspecified: Secondary | ICD-10-CM | POA: Diagnosis not present

## 2018-12-25 DIAGNOSIS — Z7982 Long term (current) use of aspirin: Secondary | ICD-10-CM | POA: Diagnosis not present

## 2018-12-25 DIAGNOSIS — W19XXXA Unspecified fall, initial encounter: Secondary | ICD-10-CM

## 2018-12-25 DIAGNOSIS — M25551 Pain in right hip: Secondary | ICD-10-CM | POA: Diagnosis not present

## 2018-12-25 DIAGNOSIS — S79911A Unspecified injury of right hip, initial encounter: Secondary | ICD-10-CM | POA: Diagnosis not present

## 2018-12-25 DIAGNOSIS — S22070A Wedge compression fracture of T9-T10 vertebra, initial encounter for closed fracture: Secondary | ICD-10-CM | POA: Insufficient documentation

## 2018-12-25 DIAGNOSIS — R69 Illness, unspecified: Secondary | ICD-10-CM | POA: Diagnosis not present

## 2018-12-25 DIAGNOSIS — Z79899 Other long term (current) drug therapy: Secondary | ICD-10-CM | POA: Insufficient documentation

## 2018-12-25 DIAGNOSIS — G309 Alzheimer's disease, unspecified: Secondary | ICD-10-CM | POA: Diagnosis not present

## 2018-12-25 DIAGNOSIS — R404 Transient alteration of awareness: Secondary | ICD-10-CM | POA: Diagnosis not present

## 2018-12-25 DIAGNOSIS — S22000A Wedge compression fracture of unspecified thoracic vertebra, initial encounter for closed fracture: Secondary | ICD-10-CM

## 2018-12-25 DIAGNOSIS — E7800 Pure hypercholesterolemia, unspecified: Secondary | ICD-10-CM | POA: Diagnosis not present

## 2018-12-25 DIAGNOSIS — S7001XA Contusion of right hip, initial encounter: Secondary | ICD-10-CM | POA: Diagnosis not present

## 2018-12-25 DIAGNOSIS — C50919 Malignant neoplasm of unspecified site of unspecified female breast: Secondary | ICD-10-CM | POA: Diagnosis not present

## 2018-12-25 DIAGNOSIS — M545 Low back pain: Secondary | ICD-10-CM | POA: Diagnosis not present

## 2018-12-25 DIAGNOSIS — R52 Pain, unspecified: Secondary | ICD-10-CM | POA: Diagnosis not present

## 2018-12-25 DIAGNOSIS — S299XXA Unspecified injury of thorax, initial encounter: Secondary | ICD-10-CM | POA: Diagnosis present

## 2018-12-25 DIAGNOSIS — S22080A Wedge compression fracture of T11-T12 vertebra, initial encounter for closed fracture: Secondary | ICD-10-CM | POA: Diagnosis not present

## 2018-12-25 DIAGNOSIS — S3992XA Unspecified injury of lower back, initial encounter: Secondary | ICD-10-CM | POA: Diagnosis not present

## 2018-12-25 MED ORDER — ACETAMINOPHEN 325 MG PO TABS
650.0000 mg | ORAL_TABLET | Freq: Once | ORAL | Status: AC
  Administered 2018-12-25: 12:00:00 650 mg via ORAL
  Filled 2018-12-25: qty 2

## 2018-12-25 NOTE — ED Notes (Signed)
Pt ambulated in room assisted. She had moments to off balance

## 2018-12-25 NOTE — ED Provider Notes (Signed)
[facility] EMERGENCY DEPARTMENT Provider Note   CSN: [id number] Arrival date & time: 12/25/18  1036    LEVEL 5 CAVEAT - DEMENTIA   History   Chief Complaint Chief Complaint  Patient presents with  . Fall    HPI Shelly Cervantes is a 83 y.o. female.     HPI  83 year old female presents with fall and hip/back pain.  History is taken from the husband at the bedside due to the patient's dementia.  She fell 3 days ago after standing up.  She has frequent episodes of dizziness with turning or standing.  Hit her head as well but did not lose consciousness.  Has not complained of a headache or been altered from baseline.  She is on a baby aspirin.  Complaining of right hip and back pain, especially with walking.  She is able to ambulate by herself.  Received Tylenol last night and some ibuprofen this morning.  Past Medical History:  Diagnosis Date  . Alzheimer's disease ([place])   . Anxiety   . Breast cancer ([place])    left  . Depression   . GERD (gastroesophageal reflux disease)    occ  . High cholesterol   . Hypertension     Patient Active Problem List   Diagnosis Date Noted  . Late onset Alzheimer's disease without behavioral disturbance ([place]) 12/07/2018  . Osteopenia 12/08/2016  . Malignant neoplasm of upper-outer quadrant of left breast in female, estrogen receptor positive ([place]) 10/14/2016    Past Surgical History:  Procedure Laterality Date  . BREAST LUMPECTOMY WITH RADIOACTIVE SEED AND SENTINEL LYMPH NODE BIOPSY Left 11/25/2016   Procedure: LEFT BREAST LUMPECTOMY WITH RADIOACTIVE SEED AND AXILLARY SENTINEL LYMPH NODE BIOPSY;  Surgeon: Excell Seltzer, MD;  Location: [facility];  Service: General;  Laterality: Left;  . EYE SURGERY Bilateral    cataracts     OB History   No obstetric history on file.      Home Medications    Prior to Admission medications   Medication Sig Start Date End Date Taking? Authorizing Provider  anastrozole (ARIMIDEX) 1 MG tablet  Take 1 tablet (1 mg total) by mouth daily. 06/05/18   [name], [name], MD  aspirin 81 MG tablet Take 81 mg by mouth daily.    [provider]  buPROPion (WELLBUTRIN XL) 300 MG 24 hr tablet Take 300 mg by mouth every morning. 05/27/16   [provider]  cholecalciferol (VITAMIN D3) 25 MCG (1000 UT) tablet Take 1 tablet (1,000 Units total) by mouth daily. 06/05/18   [name], [name], MD  donepezil (ARICEPT) 10 MG tablet Take 1 tablet (10 mg total) by mouth daily. 05/04/18   Star Age, MD  lisinopril (PRINIVIL,ZESTRIL) 20 MG tablet Take 20 mg by mouth daily. 05/27/16   [provider]  megestrol (MEGACE) 20 MG tablet 1 2 TABLETS TWICE A DAY ORALLY 30 DAY(S) 11/22/18   [provider]  memantine (NAMENDA) 10 MG tablet Take 10 mg by mouth 2 (two) times daily.    [provider]  Multiple Vitamin (MULTI-VITAMIN DAILY PO) Take 1 tablet by mouth daily.     [provider]  traZODone (DESYREL) 50 MG tablet Take 50 mg by mouth at bedtime. 05/27/16   [provider]    Family History Family History  Problem Relation Age of Onset  . Colon cancer Mother   . Cancer Father   . Lung cancer Father   . Dementia Sister  Social History Social History   Tobacco Use  . Smoking status: Never Smoker  . Smokeless tobacco: Never Used  Substance Use Topics  . Alcohol use: Yes    Alcohol/week: 3.0 standard drinks    Types: 3 Glasses of wine per week    Comment: 1-2 glasses of wine 2x/week  . Drug use: No     Allergies   Codeine   Review of Systems Review of Systems  Unable to perform ROS: Dementia     Physical Exam Updated Vital Signs BP (!) 162/74 (BP Location: Right Arm)   Temp 98.9 F (37.2 C) (Oral)   Resp 16   Ht 5\' 5"  (1.651 m)   Wt 45.4 kg   SpO2 98%   BMI 16.64 kg/m   Physical Exam Vitals signs and nursing note reviewed.  Constitutional:      Appearance: She is well-developed.  HENT:     Head: Normocephalic  and atraumatic.     Comments: No external signs of head trauma    Right Ear: External ear normal.     Left Ear: External ear normal.     Nose: Nose normal.  Eyes:     General:        Right eye: No discharge.        Left eye: No discharge.  Cardiovascular:     Rate and Rhythm: Normal rate and regular rhythm.     Heart sounds: Normal heart sounds.  Pulmonary:     Effort: Pulmonary effort is normal.     Breath sounds: Normal breath sounds.  Abdominal:     General: There is no distension.     Palpations: Abdomen is soft.     Tenderness: There is no abdominal tenderness.  Musculoskeletal:     Right hip: She exhibits tenderness (lateral). She exhibits normal range of motion.     Thoracic back: She exhibits no tenderness.     Lumbar back: She exhibits tenderness.       Legs:  Skin:    General: Skin is warm and dry.  Neurological:     Mental Status: She is alert. She is disoriented.  Psychiatric:        Mood and Affect: Mood is not anxious.      ED Treatments / Results  Labs (all labs ordered are listed, but only abnormal results are displayed) Labs Reviewed - No data to display  EKG None  Radiology Dg Lumbar Spine Complete  Result Date: 12/25/2018 CLINICAL DATA:  Pain following fall EXAM: LUMBAR SPINE - COMPLETE 4+ VIEW COMPARISON:  None. FINDINGS: Frontal, lateral, spot lumbosacral lateral, and bilateral oblique views were obtained. There are 5 non-rib-bearing lumbar type vertebral bodies. There is a transitional lumbosacral vertebra. There is thoracolumbar dextroscoliosis. There is end plate concavity at L1. No other lumbar fracture evident. There is moderately severe anterior wedging at T10 and T12. There is no spondylolisthesis. Lumbar disc spaces appear unremarkable. There is facet osteoarthritic change at L3-4, L4-5, and L5-S1 bilaterally. There is aortic atherosclerosis. IMPRESSION: Scoliosis. Age uncertain endplate concavity at L1. Age uncertain wedging at [id number] and T10.  No spondylolisthesis. Osteoarthritic change in the facets at L3-4, L4-5, and L5-S1 bilaterally. No appreciable disc space narrowing. Aortic Atherosclerosis (ICD10-I70.0). Electronically Signed   By: Lowella Grip III M.D.   On: 12/25/2018 11:51   Dg Hip Unilat W Or Wo Pelvis 2-3 Views Right  Result Date: 12/25/2018 CLINICAL DATA:  Pain following fall EXAM: DG HIP (WITH OR WITHOUT PELVIS) 2-3V  RIGHT COMPARISON:  None. FINDINGS: Frontal pelvis as well as frontal and lateral views of the right hip obtained. No fracture or dislocation. There is slight symmetric narrowing of each hip joint. No erosive change. Sacroiliac joints appear normal bilaterally. IMPRESSION: Slight symmetric narrowing of each hip joint. No fracture or dislocation. Electronically Signed   By: Lowella Grip III M.D.   On: 12/25/2018 11:45    Procedures Procedures (including critical care time)  Medications Ordered in ED Medications  acetaminophen (TYLENOL) tablet 650 mg (650 mg Oral Given 12/25/18 1138)     Initial Impression / Assessment and Plan / ED Course  I have reviewed the triage vital signs and the nursing notes.  Pertinent labs & imaging results that were available during my care of the patient were reviewed by me and considered in my medical decision making (see chart for details).        Hip x-ray without fracture of the hip or pelvis.  She was ambulated and was able to walk and bear weight with assistance.  It appears to be this is related to contusion.  The x-ray of her lumbar spine shows some age indeterminate fractures.  Unclear if these are new or not.  Either way I think supportive care at home would be warranted.  Given she is able to ambulate I think occult hip fracture is pretty unlikely.  I discussed head imaging with husband who declines and I think this is probably reasonable given the length of time since the fall.  Otherwise, will discharge home with return precautions.  Final Clinical  Impressions(s) / ED Diagnoses   Final diagnoses:  Fall, initial encounter  Compression fracture of thoracic vertebra, initial encounter, unspecified thoracic vertebral level (HCC)  Contusion of right hip, initial encounter    ED Discharge Orders    None       Sherwood Gambler, MD 12/25/18 1344

## 2018-12-25 NOTE — ED Notes (Signed)
Patient transported to X-ray 

## 2018-12-25 NOTE — Discharge Instructions (Signed)
If the hip pain does not improve or gets worse or she cannot bear weight, return to the ER for evaluation.

## 2018-12-25 NOTE — ED Triage Notes (Signed)
C/o fall Friday pm,  Per husband low back pain and rt hip pain  Has been ambulatory,  Pt does not know why she is here  Husband states she yells when moving

## 2018-12-26 ENCOUNTER — Telehealth: Payer: Self-pay

## 2018-12-26 NOTE — Progress Notes (Deleted)
PATIENT: Shelly Cervantes DOB: [birth year]  REASON FOR VISIT: follow up HISTORY FROM: patient  No chief complaint on file.    HISTORY OF PRESENT ILLNESS: Today 12/26/18 Shelly Cervantes is a 83 y.o. female here today for follow up of AD. She continues Namenda and Aricept. She is now taking Megace for weight loss. Fall on 9/11. Evaluation in ER on 9/14 was unremarkable. Breast CA?   HISTORY: (copied from [place] note on 10/03/2018)  Shelly Cervantes is an 83 year old female with a history of memory disturbance.  She returns today for follow-up.  She remains on Aricept and Namenda.  She is tolerating well medications well.  She requires some assistance with ADLs.  She no longer does any cooking.  Her husband manages the finances.  She does not operate a motor vehicle.  The husband reports some mood changes but it is manageable at this time.  He reports that she often complains of pain across both maxillary sinuses.  She can stay compliant with right before they are supposed to go eat with other people.  She has seen Dr. Wilburn Cornelia and reportedly her sinuses are clear.  Husband has also noticed that she sleeps a lot during the day.  She continues to sleep well at night.  He is also noticed that she is more forgetful.   REVIEW OF SYSTEMS: Out of a complete 14 system review of symptoms, the patient complains only of the following symptoms, and all other reviewed systems are negative.  ALLERGIES: Allergies  Allergen Reactions  . Codeine Rash    HOME MEDICATIONS: Outpatient Medications Prior to Visit  Medication Sig Dispense Refill  . anastrozole (ARIMIDEX) 1 MG tablet Take 1 tablet (1 mg total) by mouth daily. 90 tablet 4  . aspirin 81 MG tablet Take 81 mg by mouth daily.    Marland Kitchen buPROPion (WELLBUTRIN XL) 300 MG 24 hr tablet Take 300 mg by mouth every morning.  12  . cholecalciferol (VITAMIN D3) 25 MCG (1000 UT) tablet Take 1 tablet (1,000 Units total) by mouth daily. 100 tablet 4  . donepezil  (ARICEPT) 10 MG tablet Take 1 tablet (10 mg total) by mouth daily. 90 tablet 3  . lisinopril (PRINIVIL,ZESTRIL) 20 MG tablet Take 20 mg by mouth daily.  12  . megestrol (MEGACE) 20 MG tablet 1 2 TABLETS TWICE A DAY ORALLY 30 DAY(S)    . memantine (NAMENDA) 10 MG tablet Take 10 mg by mouth 2 (two) times daily.    . Multiple Vitamin (MULTI-VITAMIN DAILY PO) Take 1 tablet by mouth daily.     . traZODone (DESYREL) 50 MG tablet Take 50 mg by mouth at bedtime.  12   No facility-administered medications prior to visit.     PAST MEDICAL HISTORY: Past Medical History:  Diagnosis Date  . Alzheimer's disease ([place])   . Anxiety   . Breast cancer ([place])    left  . Depression   . GERD (gastroesophageal reflux disease)    occ  . High cholesterol   . Hypertension     PAST SURGICAL HISTORY: Past Surgical History:  Procedure Laterality Date  . BREAST LUMPECTOMY WITH RADIOACTIVE SEED AND SENTINEL LYMPH NODE BIOPSY Left 11/25/2016   Procedure: LEFT BREAST LUMPECTOMY WITH RADIOACTIVE SEED AND AXILLARY SENTINEL LYMPH NODE BIOPSY;  Surgeon: Excell Seltzer, MD;  Location: [facility];  Service: General;  Laterality: Left;  . EYE SURGERY Bilateral    cataracts    FAMILY HISTORY: Family History  Problem Relation  Age of Onset  . Colon cancer Mother   . Cancer Father   . Lung cancer Father   . Dementia Sister     SOCIAL HISTORY: Social History   Socioeconomic History  . Marital status: Married    Spouse name: Not on file  . Number of children: 2  . Years of education: 2  . Highest education level: Not on file  Occupational History  . Occupation: Retired   Scientific laboratory technician  . Financial resource strain: Not on file  . Food insecurity    Worry: Not on file    Inability: Not on file  . Transportation needs    Medical: Not on file    Non-medical: Not on file  Tobacco Use  . Smoking status: Never Smoker  . Smokeless tobacco: Never Used  Substance and Sexual Activity  . Alcohol use: Yes     Alcohol/week: 3.0 standard drinks    Types: 3 Glasses of wine per week    Comment: 1-2 glasses of wine 2x/week  . Drug use: No  . Sexual activity: Not on file  Lifestyle  . Physical activity    Days per week: Not on file    Minutes per session: Not on file  . Stress: Not on file  Relationships  . Social Herbalist on phone: Not on file    Gets together: Not on file    Attends religious service: Not on file    Active member of club or organization: Not on file    Attends meetings of clubs or organizations: Not on file    Relationship status: Not on file  . Intimate partner violence    Fear of current or ex partner: Not on file    Emotionally abused: Not on file    Physically abused: Not on file    Forced sexual activity: Not on file  Other Topics Concern  . Not on file  Social History Narrative   Denies caffeine use       PHYSICAL EXAM  There were no vitals filed for this visit. There is no height or weight on file to calculate BMI.  Generalized: Well developed, in no acute distress  Cardiology: normal rate and rhythm, no murmur noted Neurological examination  Mentation: Alert oriented to time, place, history taking. Follows all commands speech and language fluent Cranial nerve II-XII: Pupils were equal round reactive to light. Extraocular movements were full, visual field were full on confrontational test. Facial sensation and strength were normal. Uvula tongue midline. Head turning and shoulder shrug  were normal and symmetric. Motor: The motor testing reveals 5 over 5 strength of all 4 extremities. Good symmetric motor tone is noted throughout.  Sensory: Sensory testing is intact to soft touch on all 4 extremities. No evidence of extinction is noted.  Coordination: Cerebellar testing reveals good finger-nose-finger and heel-to-shin bilaterally.  Gait and station: Gait is normal. Tandem gait is normal. Romberg is negative. No drift is seen.  Reflexes: Deep  tendon reflexes are symmetric and normal bilaterally.   DIAGNOSTIC DATA (LABS, IMAGING, TESTING) - I reviewed patient records, labs, notes, testing and imaging myself where available.  MMSE - Mini Mental State Exam 10/03/2018 05/04/2018 10/31/2017  Not completed: (No Data) - -  Orientation to time 0 2 0  Orientation to Place 3 3 4   Registration 3 3 3   Attention/ Calculation 2 4 5   Recall 0 0 0  Language- name 2 objects 2 2 2   Language-  repeat 1 1 1   Language- follow 3 step command 2 3 2   Language- read & follow direction 1 1 1   Write a sentence 1 1 1   Copy design 1 1 0  Total score 16 21 19      Lab Results  Component Value Date   WBC 3.6 (L) 06/05/2018   HGB 12.6 06/05/2018   HCT 38.6 06/05/2018   MCV 91.9 06/05/2018   PLT 133 (L) 06/05/2018      Component Value Date/Time   NA 141 06/05/2018 1426   NA 139 02/07/2017 1301   K 3.6 06/05/2018 1426   K 4.0 02/07/2017 1301   CL 104 06/05/2018 1426   CO2 28 06/05/2018 1426   CO2 26 02/07/2017 1301   GLUCOSE 117 (H) 06/05/2018 1426   GLUCOSE 98 02/07/2017 1301   BUN 12 06/05/2018 1426   BUN 18.3 02/07/2017 1301   CREATININE 1.13 (H) 06/05/2018 1426   CREATININE 1.2 (H) 02/07/2017 1301   CALCIUM 9.4 06/05/2018 1426   CALCIUM 10.2 02/07/2017 1301   PROT 7.4 06/05/2018 1426   PROT 7.5 02/07/2017 1301   ALBUMIN 4.3 06/05/2018 1426   ALBUMIN 4.3 02/07/2017 1301   AST 22 06/05/2018 1426   AST 23 02/07/2017 1301   ALT 17 06/05/2018 1426   ALT 24 02/07/2017 1301   ALKPHOS 64 06/05/2018 1426   ALKPHOS 64 02/07/2017 1301   BILITOT 0.5 06/05/2018 1426   BILITOT 0.45 02/07/2017 1301   GFRNONAA 46 (L) 06/05/2018 1426   GFRAA 53 (L) 06/05/2018 1426   No results found for: CHOL, HDL, LDLCALC, LDLDIRECT, TRIG, CHOLHDL No results found for: HGBA1C No results found for: VITAMINB12 No results found for: TSH     ASSESSMENT AND PLAN 83 y.o. year old female  has a past medical history of Alzheimer's disease ([place]), Anxiety,  Breast cancer ([place]), Depression, GERD (gastroesophageal reflux disease), High cholesterol, and Hypertension. here with ***  No diagnosis found.     No orders of the defined types were placed in this encounter.    No orders of the defined types were placed in this encounter.     I spent 15 minutes with the patient. 50% of this time was spent counseling and educating patient on plan of care and medications.    Debbora Presto, FNP-C 12/26/2018, 12:15 PM [facility] [address], [place], [place] [address] [phone]

## 2018-12-26 NOTE — Telephone Encounter (Signed)
Pts husband call that his wife will not be able to make appt tomorrow. I explain the no show policy at [facility]. He stated his wife fell this weekend and its difficult to get her to appts. He wanted some guidance on what to do because of difficulty in getting her to our office. Noted pt no show cancel for 9/16, no show for 8/27 and cancel 8/20. He stated pt has alzheimer disease,and its difficult at times. I tried to r/s but pt wanted to speak with nurse first.

## 2018-12-26 NOTE — Telephone Encounter (Signed)
Contact number husband is [phone]

## 2018-12-26 NOTE — Telephone Encounter (Addendum)
I called husband.  I offered VV/ DOXY.  He was grateful for this as he cannot get pt out of house.  His email [email or web address].  Will sent doxy link tomorrow.  01-04-19 at 1100. Doxy link emailed to pt this am 0839.

## 2018-12-27 ENCOUNTER — Ambulatory Visit: Payer: Medicare HMO | Admitting: Family Medicine

## 2019-01-04 ENCOUNTER — Telehealth: Payer: Self-pay | Admitting: Adult Health

## 2019-01-04 NOTE — Telephone Encounter (Signed)
I spoke to husband of pt.  He will try mychart if needed.  He is looking in to memory care facility at this time.  Will call back if needed.

## 2019-01-04 NOTE — Telephone Encounter (Signed)
Pt husband is asking for a call from [place] to discuss what to do about another appointment for pt. Husband states coming into the office is not an option for pt.

## 2019-01-11 ENCOUNTER — Ambulatory Visit: Payer: Self-pay | Admitting: Adult Health

## 2019-01-11 DIAGNOSIS — R69 Illness, unspecified: Secondary | ICD-10-CM | POA: Diagnosis not present

## 2019-01-11 DIAGNOSIS — H409 Unspecified glaucoma: Secondary | ICD-10-CM | POA: Diagnosis not present

## 2019-01-11 DIAGNOSIS — Z9181 History of falling: Secondary | ICD-10-CM | POA: Diagnosis not present

## 2019-01-11 DIAGNOSIS — F329 Major depressive disorder, single episode, unspecified: Secondary | ICD-10-CM | POA: Diagnosis not present

## 2019-01-11 DIAGNOSIS — R2689 Other abnormalities of gait and mobility: Secondary | ICD-10-CM | POA: Diagnosis not present

## 2019-01-11 DIAGNOSIS — I10 Essential (primary) hypertension: Secondary | ICD-10-CM | POA: Diagnosis not present

## 2019-01-11 DIAGNOSIS — E46 Unspecified protein-calorie malnutrition: Secondary | ICD-10-CM | POA: Diagnosis not present

## 2019-01-11 DIAGNOSIS — Z681 Body mass index (BMI) 19 or less, adult: Secondary | ICD-10-CM | POA: Diagnosis not present

## 2019-01-11 DIAGNOSIS — G4700 Insomnia, unspecified: Secondary | ICD-10-CM | POA: Diagnosis not present

## 2019-01-11 DIAGNOSIS — R634 Abnormal weight loss: Secondary | ICD-10-CM | POA: Diagnosis not present

## 2019-01-12 DIAGNOSIS — H409 Unspecified glaucoma: Secondary | ICD-10-CM | POA: Diagnosis not present

## 2019-01-12 DIAGNOSIS — F329 Major depressive disorder, single episode, unspecified: Secondary | ICD-10-CM | POA: Diagnosis not present

## 2019-01-12 DIAGNOSIS — R2689 Other abnormalities of gait and mobility: Secondary | ICD-10-CM | POA: Diagnosis not present

## 2019-01-12 DIAGNOSIS — R634 Abnormal weight loss: Secondary | ICD-10-CM | POA: Diagnosis not present

## 2019-01-12 DIAGNOSIS — E46 Unspecified protein-calorie malnutrition: Secondary | ICD-10-CM | POA: Diagnosis not present

## 2019-01-12 DIAGNOSIS — Z681 Body mass index (BMI) 19 or less, adult: Secondary | ICD-10-CM | POA: Diagnosis not present

## 2019-01-12 DIAGNOSIS — G4700 Insomnia, unspecified: Secondary | ICD-10-CM | POA: Diagnosis not present

## 2019-01-12 DIAGNOSIS — Z9181 History of falling: Secondary | ICD-10-CM | POA: Diagnosis not present

## 2019-01-12 DIAGNOSIS — R69 Illness, unspecified: Secondary | ICD-10-CM | POA: Diagnosis not present

## 2019-01-12 DIAGNOSIS — I10 Essential (primary) hypertension: Secondary | ICD-10-CM | POA: Diagnosis not present

## 2019-01-17 DIAGNOSIS — R69 Illness, unspecified: Secondary | ICD-10-CM | POA: Diagnosis not present

## 2019-01-17 DIAGNOSIS — E46 Unspecified protein-calorie malnutrition: Secondary | ICD-10-CM | POA: Diagnosis not present

## 2019-01-17 DIAGNOSIS — R634 Abnormal weight loss: Secondary | ICD-10-CM | POA: Diagnosis not present

## 2019-01-17 DIAGNOSIS — R2689 Other abnormalities of gait and mobility: Secondary | ICD-10-CM | POA: Diagnosis not present

## 2019-01-17 DIAGNOSIS — I10 Essential (primary) hypertension: Secondary | ICD-10-CM | POA: Diagnosis not present

## 2019-01-17 DIAGNOSIS — Z681 Body mass index (BMI) 19 or less, adult: Secondary | ICD-10-CM | POA: Diagnosis not present

## 2019-01-17 DIAGNOSIS — G4700 Insomnia, unspecified: Secondary | ICD-10-CM | POA: Diagnosis not present

## 2019-01-17 DIAGNOSIS — H409 Unspecified glaucoma: Secondary | ICD-10-CM | POA: Diagnosis not present

## 2019-01-17 DIAGNOSIS — Z9181 History of falling: Secondary | ICD-10-CM | POA: Diagnosis not present

## 2019-01-17 DIAGNOSIS — F329 Major depressive disorder, single episode, unspecified: Secondary | ICD-10-CM | POA: Diagnosis not present

## 2019-01-18 ENCOUNTER — Inpatient Hospital Stay (HOSPITAL_COMMUNITY)
Admission: EM | Admit: 2019-01-18 | Discharge: 2019-01-27 | DRG: 956 | Disposition: A | Discharge status: Skilled Nursing Facility | Payer: Medicare HMO | Attending: Internal Medicine | Admitting: Internal Medicine

## 2019-01-18 ENCOUNTER — Emergency Department (HOSPITAL_COMMUNITY): Payer: Medicare HMO

## 2019-01-18 ENCOUNTER — Other Ambulatory Visit: Payer: Self-pay

## 2019-01-18 ENCOUNTER — Encounter (HOSPITAL_COMMUNITY): Payer: Self-pay | Admitting: Emergency Medicine

## 2019-01-18 DIAGNOSIS — R52 Pain, unspecified: Secondary | ICD-10-CM | POA: Diagnosis not present

## 2019-01-18 DIAGNOSIS — R29810 Facial weakness: Secondary | ICD-10-CM | POA: Diagnosis not present

## 2019-01-18 DIAGNOSIS — R69 Illness, unspecified: Secondary | ICD-10-CM | POA: Diagnosis not present

## 2019-01-18 DIAGNOSIS — R29703 NIHSS score 3: Secondary | ICD-10-CM | POA: Diagnosis not present

## 2019-01-18 DIAGNOSIS — R9431 Abnormal electrocardiogram [ECG] [EKG]: Secondary | ICD-10-CM | POA: Diagnosis not present

## 2019-01-18 DIAGNOSIS — W19XXXA Unspecified fall, initial encounter: Secondary | ICD-10-CM | POA: Diagnosis present

## 2019-01-18 DIAGNOSIS — Y92009 Unspecified place in unspecified non-institutional (private) residence as the place of occurrence of the external cause: Secondary | ICD-10-CM | POA: Diagnosis not present

## 2019-01-18 DIAGNOSIS — F0280 Dementia in other diseases classified elsewhere without behavioral disturbance: Secondary | ICD-10-CM | POA: Diagnosis present

## 2019-01-18 DIAGNOSIS — I700 Atherosclerosis of aorta: Secondary | ICD-10-CM | POA: Diagnosis present

## 2019-01-18 DIAGNOSIS — R0902 Hypoxemia: Secondary | ICD-10-CM | POA: Diagnosis not present

## 2019-01-18 DIAGNOSIS — S299XXA Unspecified injury of thorax, initial encounter: Secondary | ICD-10-CM | POA: Diagnosis not present

## 2019-01-18 DIAGNOSIS — C50412 Malignant neoplasm of upper-outer quadrant of left female breast: Secondary | ICD-10-CM | POA: Diagnosis not present

## 2019-01-18 DIAGNOSIS — E876 Hypokalemia: Secondary | ICD-10-CM | POA: Diagnosis not present

## 2019-01-18 DIAGNOSIS — I160 Hypertensive urgency: Secondary | ICD-10-CM | POA: Diagnosis not present

## 2019-01-18 DIAGNOSIS — Z885 Allergy status to narcotic agent status: Secondary | ICD-10-CM

## 2019-01-18 DIAGNOSIS — G919 Hydrocephalus, unspecified: Secondary | ICD-10-CM | POA: Diagnosis not present

## 2019-01-18 DIAGNOSIS — E785 Hyperlipidemia, unspecified: Secondary | ICD-10-CM | POA: Diagnosis present

## 2019-01-18 DIAGNOSIS — S32591A Other specified fracture of right pubis, initial encounter for closed fracture: Secondary | ICD-10-CM | POA: Diagnosis not present

## 2019-01-18 DIAGNOSIS — I6330 Cerebral infarction due to thrombosis of unspecified cerebral artery: Secondary | ICD-10-CM | POA: Diagnosis not present

## 2019-01-18 DIAGNOSIS — Z7401 Bed confinement status: Secondary | ICD-10-CM | POA: Diagnosis not present

## 2019-01-18 DIAGNOSIS — I469 Cardiac arrest, cause unspecified: Secondary | ICD-10-CM | POA: Diagnosis not present

## 2019-01-18 DIAGNOSIS — E7800 Pure hypercholesterolemia, unspecified: Secondary | ICD-10-CM | POA: Diagnosis not present

## 2019-01-18 DIAGNOSIS — Z79899 Other long term (current) drug therapy: Secondary | ICD-10-CM

## 2019-01-18 DIAGNOSIS — Z20828 Contact with and (suspected) exposure to other viral communicable diseases: Secondary | ICD-10-CM | POA: Diagnosis not present

## 2019-01-18 DIAGNOSIS — Z7982 Long term (current) use of aspirin: Secondary | ICD-10-CM

## 2019-01-18 DIAGNOSIS — I6523 Occlusion and stenosis of bilateral carotid arteries: Secondary | ICD-10-CM | POA: Diagnosis not present

## 2019-01-18 DIAGNOSIS — Z170 Estrogen receptor positive status [ER+]: Secondary | ICD-10-CM

## 2019-01-18 DIAGNOSIS — S72002A Fracture of unspecified part of neck of left femur, initial encounter for closed fracture: Secondary | ICD-10-CM | POA: Diagnosis not present

## 2019-01-18 DIAGNOSIS — I639 Cerebral infarction, unspecified: Secondary | ICD-10-CM | POA: Diagnosis not present

## 2019-01-18 DIAGNOSIS — F419 Anxiety disorder, unspecified: Secondary | ICD-10-CM | POA: Diagnosis present

## 2019-01-18 DIAGNOSIS — Z23 Encounter for immunization: Secondary | ICD-10-CM

## 2019-01-18 DIAGNOSIS — K219 Gastro-esophageal reflux disease without esophagitis: Secondary | ICD-10-CM | POA: Diagnosis present

## 2019-01-18 DIAGNOSIS — F329 Major depressive disorder, single episode, unspecified: Secondary | ICD-10-CM | POA: Diagnosis present

## 2019-01-18 DIAGNOSIS — Z419 Encounter for procedure for purposes other than remedying health state, unspecified: Secondary | ICD-10-CM

## 2019-01-18 DIAGNOSIS — Z79811 Long term (current) use of aromatase inhibitors: Secondary | ICD-10-CM

## 2019-01-18 DIAGNOSIS — S199XXA Unspecified injury of neck, initial encounter: Secondary | ICD-10-CM | POA: Diagnosis not present

## 2019-01-18 DIAGNOSIS — I739 Peripheral vascular disease, unspecified: Secondary | ICD-10-CM | POA: Diagnosis present

## 2019-01-18 DIAGNOSIS — G9389 Other specified disorders of brain: Secondary | ICD-10-CM | POA: Diagnosis present

## 2019-01-18 DIAGNOSIS — Z9842 Cataract extraction status, left eye: Secondary | ICD-10-CM

## 2019-01-18 DIAGNOSIS — Z853 Personal history of malignant neoplasm of breast: Secondary | ICD-10-CM

## 2019-01-18 DIAGNOSIS — I10 Essential (primary) hypertension: Secondary | ICD-10-CM | POA: Diagnosis not present

## 2019-01-18 DIAGNOSIS — Z801 Family history of malignant neoplasm of trachea, bronchus and lung: Secondary | ICD-10-CM

## 2019-01-18 DIAGNOSIS — S72145A Nondisplaced intertrochanteric fracture of left femur, initial encounter for closed fracture: Principal | ICD-10-CM | POA: Diagnosis present

## 2019-01-18 DIAGNOSIS — Z03818 Encounter for observation for suspected exposure to other biological agents ruled out: Secondary | ICD-10-CM | POA: Diagnosis not present

## 2019-01-18 DIAGNOSIS — S0990XA Unspecified injury of head, initial encounter: Secondary | ICD-10-CM | POA: Diagnosis not present

## 2019-01-18 DIAGNOSIS — G301 Alzheimer's disease with late onset: Secondary | ICD-10-CM

## 2019-01-18 DIAGNOSIS — R296 Repeated falls: Secondary | ICD-10-CM | POA: Diagnosis not present

## 2019-01-18 DIAGNOSIS — S72009A Fracture of unspecified part of neck of unspecified femur, initial encounter for closed fracture: Secondary | ICD-10-CM

## 2019-01-18 DIAGNOSIS — F5105 Insomnia due to other mental disorder: Secondary | ICD-10-CM | POA: Diagnosis present

## 2019-01-18 DIAGNOSIS — I6389 Other cerebral infarction: Secondary | ICD-10-CM | POA: Diagnosis not present

## 2019-01-18 DIAGNOSIS — S72142D Displaced intertrochanteric fracture of left femur, subsequent encounter for closed fracture with routine healing: Secondary | ICD-10-CM | POA: Diagnosis not present

## 2019-01-18 DIAGNOSIS — I6503 Occlusion and stenosis of bilateral vertebral arteries: Secondary | ICD-10-CM | POA: Diagnosis not present

## 2019-01-18 DIAGNOSIS — M25552 Pain in left hip: Secondary | ICD-10-CM | POA: Diagnosis present

## 2019-01-18 DIAGNOSIS — Z880 Allergy status to penicillin: Secondary | ICD-10-CM

## 2019-01-18 DIAGNOSIS — R402411 Glasgow coma scale score 13-15, in the field [EMT or ambulance]: Secondary | ICD-10-CM | POA: Diagnosis not present

## 2019-01-18 DIAGNOSIS — Z9841 Cataract extraction status, right eye: Secondary | ICD-10-CM

## 2019-01-18 DIAGNOSIS — M2550 Pain in unspecified joint: Secondary | ICD-10-CM | POA: Diagnosis not present

## 2019-01-18 DIAGNOSIS — Z800 Family history of malignant neoplasm of digestive organs: Secondary | ICD-10-CM

## 2019-01-18 LAB — CBC WITH DIFFERENTIAL/PLATELET
Abs Immature Granulocytes: 0.07 10*3/uL (ref 0.00–0.07)
Basophils Absolute: 0 10*3/uL (ref 0.0–0.1)
Basophils Relative: 0 %
Eosinophils Absolute: 0.1 10*3/uL (ref 0.0–0.5)
Eosinophils Relative: 1 %
HCT: 36.4 % (ref 36.0–46.0)
Hemoglobin: 12.2 g/dL (ref 12.0–15.0)
Immature Granulocytes: 1 %
Lymphocytes Relative: 10 %
Lymphs Abs: 0.9 10*3/uL (ref 0.7–4.0)
MCH: 29.8 pg (ref 26.0–34.0)
MCHC: 33.5 g/dL (ref 30.0–36.0)
MCV: 89 fL (ref 80.0–100.0)
Monocytes Absolute: 0.3 10*3/uL (ref 0.1–1.0)
Monocytes Relative: 4 %
Neutro Abs: 8 10*3/uL — ABNORMAL HIGH (ref 1.7–7.7)
Neutrophils Relative %: 84 %
Platelets: 204 10*3/uL (ref 150–400)
RBC: 4.09 MIL/uL (ref 3.87–5.11)
RDW: 13.2 % (ref 11.5–15.5)
WBC: 9.5 10*3/uL (ref 4.0–10.5)
nRBC: 0 % (ref 0.0–0.2)

## 2019-01-18 LAB — COMPREHENSIVE METABOLIC PANEL
ALT: 13 U/L (ref 0–44)
AST: 19 U/L (ref 15–41)
Albumin: 3.6 g/dL (ref 3.5–5.0)
Alkaline Phosphatase: 124 U/L (ref 38–126)
Anion gap: 13 (ref 5–15)
BUN: 11 mg/dL (ref 8–23)
CO2: 25 mmol/L (ref 22–32)
Calcium: 9 mg/dL (ref 8.9–10.3)
Chloride: 100 mmol/L (ref 98–111)
Creatinine, Ser: 0.88 mg/dL (ref 0.44–1.00)
GFR calc Af Amer: >60 mL/min (ref >60)
GFR calc non Af Amer: >60 mL/min (ref >60)
Glucose, Bld: 126 mg/dL — ABNORMAL HIGH (ref 70–99)
Potassium: 2.5 mmol/L — CL (ref 3.5–5.1)
Sodium: 138 mmol/L (ref 135–145)
Total Bilirubin: 0.8 mg/dL (ref 0.3–1.2)
Total Protein: 6.7 g/dL (ref 6.5–8.1)

## 2019-01-18 LAB — ABO/RH: ABO/RH(D): O POS

## 2019-01-18 LAB — TYPE AND SCREEN
ABO/RH(D): O POS
Antibody Screen: NEGATIVE

## 2019-01-18 LAB — PROTIME-INR
INR: 1 (ref 0.8–1.2)
Prothrombin Time: 13.2 seconds (ref 11.4–15.2)

## 2019-01-18 LAB — SARS CORONAVIRUS 2 BY RT PCR (HOSPITAL ORDER, PERFORMED IN ~~LOC~~ HOSPITAL LAB): SARS Coronavirus 2: NEGATIVE

## 2019-01-18 MED ORDER — CHLORHEXIDINE GLUCONATE 4 % EX LIQD
60.0000 mL | Freq: Once | CUTANEOUS | Status: AC
  Administered 2019-01-19: 4 via TOPICAL
  Filled 2019-01-18: qty 60

## 2019-01-18 MED ORDER — BUPROPION HCL ER (XL) 150 MG PO TB24
300.0000 mg | ORAL_TABLET | Freq: Every morning | ORAL | Status: DC
  Filled 2019-01-18: qty 2

## 2019-01-18 MED ORDER — LABETALOL HCL 5 MG/ML IV SOLN
10.0000 mg | INTRAVENOUS | Status: DC | PRN
  Administered 2019-01-19 – 2019-01-21 (×2): 10 mg via INTRAVENOUS
  Filled 2019-01-18 (×2): qty 4

## 2019-01-18 MED ORDER — ANASTROZOLE 1 MG PO TABS
1.0000 mg | ORAL_TABLET | Freq: Every day | ORAL | Status: DC
  Filled 2019-01-18: qty 1

## 2019-01-18 MED ORDER — FENTANYL CITRATE (PF) 100 MCG/2ML IJ SOLN
50.0000 ug | Freq: Once | INTRAMUSCULAR | Status: AC
  Administered 2019-01-18: 50 ug via INTRAVENOUS
  Filled 2019-01-18: qty 2

## 2019-01-18 MED ORDER — SODIUM CHLORIDE 0.9 % IV SOLN
INTRAVENOUS | Status: AC
  Administered 2019-01-18: 21:00:00 via INTRAVENOUS

## 2019-01-18 MED ORDER — FENTANYL CITRATE (PF) 100 MCG/2ML IJ SOLN
12.5000 ug | INTRAMUSCULAR | Status: DC | PRN
  Administered 2019-01-18 (×2): 12.5 ug via INTRAVENOUS
  Administered 2019-01-19: 25 ug via INTRAVENOUS
  Administered 2019-01-19: 12.5 ug via INTRAVENOUS
  Administered 2019-01-23: 25 ug via INTRAVENOUS
  Administered 2019-01-26: 12.5 ug via INTRAVENOUS
  Filled 2019-01-18 (×6): qty 2

## 2019-01-18 MED ORDER — VANCOMYCIN HCL IN DEXTROSE 1-5 GM/200ML-% IV SOLN
1000.0000 mg | INTRAVENOUS | Status: AC
  Administered 2019-01-19: 1000 mg via INTRAVENOUS
  Filled 2019-01-18 (×2): qty 200

## 2019-01-18 MED ORDER — ENSURE PRE-SURGERY PO LIQD
296.0000 mL | Freq: Once | ORAL | Status: AC
  Administered 2019-01-18: 296 mL via ORAL
  Filled 2019-01-18: qty 296

## 2019-01-18 MED ORDER — METHOCARBAMOL 500 MG PO TABS
500.0000 mg | ORAL_TABLET | Freq: Four times a day (QID) | ORAL | Status: DC | PRN
  Filled 2019-01-18: qty 1

## 2019-01-18 MED ORDER — POTASSIUM CHLORIDE CRYS ER 20 MEQ PO TBCR
20.0000 meq | EXTENDED_RELEASE_TABLET | Freq: Three times a day (TID) | ORAL | Status: DC
  Administered 2019-01-18: 20 meq via ORAL
  Filled 2019-01-18: qty 1

## 2019-01-18 MED ORDER — MAGNESIUM SULFATE IN D5W 1-5 GM/100ML-% IV SOLN
1.0000 g | Freq: Once | INTRAVENOUS | Status: DC
  Filled 2019-01-18: qty 100

## 2019-01-18 MED ORDER — POVIDONE-IODINE 10 % EX SWAB: 2.0000 "application " | Freq: Once | CUTANEOUS | Status: DC

## 2019-01-18 MED ORDER — METHOCARBAMOL 1000 MG/10ML IJ SOLN
500.0000 mg | Freq: Four times a day (QID) | INTRAVENOUS | Status: DC | PRN
  Filled 2019-01-18: qty 5

## 2019-01-18 MED ORDER — SENNOSIDES-DOCUSATE SODIUM 8.6-50 MG PO TABS
1.0000 | ORAL_TABLET | Freq: Every evening | ORAL | Status: DC | PRN
  Filled 2019-01-18: qty 1

## 2019-01-18 MED ORDER — POTASSIUM CHLORIDE 10 MEQ/100ML IV SOLN
10.0000 meq | INTRAVENOUS | Status: AC
  Administered 2019-01-18 (×4): 10 meq via INTRAVENOUS
  Filled 2019-01-18 (×3): qty 100

## 2019-01-18 NOTE — ED Triage Notes (Signed)
Pt. fell from a standing position; witnessed by husband. Husband denies wife  loss of consciousness or hitting head. EMS gave 100 mcg of fentanyl enroute. Pt. Complains of cramps and pain with movement of hip. Pt. Alert and verbalizes pain despite dementia.

## 2019-01-18 NOTE — H&P (Signed)
History and Physical    Shelly Cervantes [id number] DOB: [birth year] DOA: 01/18/2019  PCP: [name], MD   Patient coming from: [facility]   Chief Complaint: Fall, left hip pain   HPI: Shelly Cervantes is a 83 y.o. female with medical history significant for dementia, breast cancer status post definitive surgery, hypertension, and anxiety, now presenting to the emergency department for evaluation of left hip pain after a fall.  Patient's husband is at the bedside and has been concerned about patient's recent gait difficulty with frequent falls, reporting that this is the fourth fall she has had in the past few weeks.  She had not been complaining of anything leading up to this and seemed to be having an uneventful day.  Patient denies losing consciousness or experiencing any chest pain or lightheadedness prior to the fall.  Her husband heard her yell from another room, found her on the floor complaining of severe left hip pain, and called EMS.  There has not been any recent cough, shortness of breath, fevers, or chest pain.  ED Course: Upon arrival to the ED, patient is found to be afebrile, saturating adequately on room air, and hypertensive with a systolic pressure of [id number].  EKG features a sinus rhythm with QTc interval of 524 ms.  Chest x-ray is negative for acute cardiopulmonary disease.  Radiographs of the hip and pelvis demonstrate acute nondisplaced left intertrochanteric femur fracture as well as more subacute appearing nondisplaced fractures of the right superior and inferior pubic rami.  Chemistry panel notable for potassium 2.5.  CBC unremarkable.  CT of the head and cervical spine is negative for acute findings but notable for prominent lateral ventricles.  Orthopedic surgery was consulted by the ED physician and the patient was treated with fentanyl and IV potassium in the ED. COVID-19 test ordered.   Review of Systems:  All other systems reviewed and apart from HPI, are negative.  Past  Medical History:  Diagnosis Date   Alzheimer's disease ([place])    Anxiety    Breast cancer ([place])    left   Depression    GERD (gastroesophageal reflux disease)    occ   High cholesterol    Hypertension     Past Surgical History:  Procedure Laterality Date   BREAST LUMPECTOMY WITH RADIOACTIVE SEED AND SENTINEL LYMPH NODE BIOPSY Left 11/25/2016   Procedure: LEFT BREAST LUMPECTOMY WITH RADIOACTIVE SEED AND AXILLARY SENTINEL LYMPH NODE BIOPSY;  Surgeon: Excell Seltzer, MD;  Location: [facility];  Service: General;  Laterality: Left;   EYE SURGERY Bilateral    cataracts     reports that she has never smoked. She has never used smokeless tobacco. She reports current alcohol use of about 3.0 standard drinks of alcohol per week. She reports that she does not use drugs.  Allergies  Allergen Reactions   Penicillins    Codeine Rash    Family History  Problem Relation Age of Onset   Colon cancer Mother    Cancer Father    Lung cancer Father    Dementia Sister      Prior to Admission medications   Medication Sig Start Date End Date Taking? Authorizing Provider  anastrozole (ARIMIDEX) 1 MG tablet Take 1 tablet (1 mg total) by mouth daily. 06/05/18   [name], [name], MD  aspirin 81 MG tablet Take 81 mg by mouth daily.    [provider]  buPROPion (WELLBUTRIN XL) 300 MG 24 hr tablet Take 300 mg by mouth  every morning. 05/27/16   [provider]  cholecalciferol (VITAMIN D3) 25 MCG (1000 UT) tablet Take 1 tablet (1,000 Units total) by mouth daily. 06/05/18   [name], [name], MD  donepezil (ARICEPT) 10 MG tablet Take 1 tablet (10 mg total) by mouth daily. 05/04/18   Star Age, MD  lisinopril (PRINIVIL,ZESTRIL) 20 MG tablet Take 20 mg by mouth daily. 05/27/16   [provider]  megestrol (MEGACE) 20 MG tablet 1 2 TABLETS TWICE A DAY ORALLY 30 DAY(S) 11/22/18   [provider]  memantine (NAMENDA) 10 MG tablet Take 10 mg by mouth 2 (two)  times daily.    [provider]  Multiple Vitamin (MULTI-VITAMIN DAILY PO) Take 1 tablet by mouth daily.     [provider]  traZODone (DESYREL) 50 MG tablet Take 50 mg by mouth at bedtime. 05/27/16   [provider]    Physical Exam: Vitals:   01/18/19 1745 01/18/19 1800 01/18/19 1815 01/18/19 1845  BP: (!) 164/89 (!) 181/91 (!) 197/88 (!) 166/103  Pulse: 72     Resp: 15 20 16 19   Temp:      TempSrc:      SpO2: 97%       Constitutional: NAD, calm  Eyes: PERTLA, lids and conjunctivae normal ENMT: Mucous membranes are moist. Posterior pharynx clear of any exudate or lesions.   Neck: normal, supple, no masses, no thyromegaly Respiratory: no wheezing, no crackles. Normal respiratory effort. No accessory muscle use.  Cardiovascular: S1 & S2 heard, regular rate and rhythm. No extremity edema.   Abdomen: No distension, no tenderness, soft. Bowel sounds active.   Musculoskeletal: no clubbing / cyanosis. Left hip tenderness, neurovascularly intact distally.   Skin: no significant rashes, lesions, ulcers. Warm, dry, well-perfused. Neurologic: CN 2-12 grossly intact. Sensation intact. Strength 5/5 in all 4 limbs.  Psychiatric: Alert and oriented to person, place, and situation. Calm, cooperative.    Labs on Admission: I have personally reviewed following labs and imaging studies  CBC: Recent Labs  Lab 01/18/19 1645  WBC 9.5  NEUTROABS 8.0*  HGB 12.2  HCT 36.4  MCV 89.0  PLT 0000000   Basic Metabolic Panel: Recent Labs  Lab 01/18/19 1645  NA 138  K 2.5*  CL 100  CO2 25  GLUCOSE 126*  BUN 11  CREATININE 0.88  CALCIUM 9.0   GFR: CrCl cannot be calculated (Unknown ideal weight.). Liver Function Tests: Recent Labs  Lab 01/18/19 1645  AST 19  ALT 13  ALKPHOS 124  BILITOT 0.8  PROT 6.7  ALBUMIN 3.6   No results for input(s): LIPASE, AMYLASE in the last 168 hours. No results for input(s): AMMONIA in the last 168 hours. Coagulation  Profile: Recent Labs  Lab 01/18/19 1645  INR 1.0   Cardiac Enzymes: No results for input(s): CKTOTAL, CKMB, CKMBINDEX, TROPONINI in the last 168 hours. BNP (last 3 results) No results for input(s): PROBNP in the last 8760 hours. HbA1C: No results for input(s): HGBA1C in the last 72 hours. CBG: No results for input(s): GLUCAP in the last 168 hours. Lipid Profile: No results for input(s): CHOL, HDL, LDLCALC, TRIG, CHOLHDL, LDLDIRECT in the last 72 hours. Thyroid Function Tests: No results for input(s): TSH, T4TOTAL, FREET4, T3FREE, THYROIDAB in the last 72 hours. Anemia Panel: No results for input(s): VITAMINB12, FOLATE, FERRITIN, TIBC, IRON, RETICCTPCT in the last 72 hours. Urine analysis: No results found for: COLORURINE, APPEARANCEUR, LABSPEC, PHURINE, GLUCOSEU, HGBUR, BILIRUBINUR, KETONESUR, PROTEINUR, UROBILINOGEN, NITRITE, LEUKOCYTESUR Sepsis  Labs: @LABRCNTIP (procalcitonin:4,lacticidven:4) )No results found for this or any previous visit (from the past 240 hour(s)).   Radiological Exams on Admission: Dg Chest 1 View  Result Date: 01/18/2019 CLINICAL DATA:  Fall, left leg pain EXAM: CHEST  1 VIEW COMPARISON:  07/11/2006 FINDINGS: The heart size and mediastinal contours are within normal limits. Both lungs are clear. The visualized skeletal structures are unremarkable. IMPRESSION: No acute cardiopulmonary findings. Electronically Signed   By: Davina Poke M.D.   On: 01/18/2019 16:39   Dg Pelvis 1-2 Views  Result Date: 01/18/2019 CLINICAL DATA:  Fall.  Left leg pain. EXAM: PELVIS - 1-2 VIEW COMPARISON:  Pelvic and right hip x-rays dated December 25, 2018. FINDINGS: Acute largely nondisplaced fracture of the left intertrochanteric femur. More subacute appearing nondisplaced fractures of the right superior and inferior pubic rami, new since the prior study. No dislocation. The hip joint spaces are relatively preserved. Osteopenia. Soft tissues are unremarkable. IMPRESSION: 1.  Acute nondisplaced left intertrochanteric femur fracture. 2. More subacute appearing nondisplaced fractures of the right superior and inferior pubic rami, new since the prior study. Electronically Signed   By: Titus Dubin M.D.   On: 01/18/2019 16:42   Ct Head Wo Contrast  Result Date: 01/18/2019 CLINICAL DATA:  Fall. EXAM: CT HEAD WITHOUT CONTRAST CT CERVICAL SPINE WITHOUT CONTRAST TECHNIQUE: Multidetector CT imaging of the head and cervical spine was performed following the standard protocol without intravenous contrast. Multiplanar CT image reconstructions of the cervical spine were also generated. COMPARISON:  CT paranasal sinuses dated December 20, 2017. FINDINGS: CT HEAD FINDINGS Brain: No evidence of acute infarction, hemorrhage, extra-axial collection or mass lesion/mass effect. Mild generalized cerebral atrophy. Scattered moderate periventricular and subcortical white matter hypodensities are nonspecific, but favored to reflect chronic microvascular ischemic changes. Prominent lateral ventricles, greater than expected based on the degree of atrophy. Vascular: Calcified atherosclerosis at the skullbase. No hyperdense vessel. Skull: Normal. Negative for fracture or focal lesion. Sinuses/Orbits: No acute finding. Other: None. CT CERVICAL SPINE FINDINGS Alignment: No traumatic malalignment. Trace stepwise anterolisthesis from C3-C4 through C7-T1. Skull base and vertebrae: No acute fracture. No primary bone lesion or focal pathologic process. Soft tissues and spinal canal: No prevertebral fluid or swelling. No visible canal hematoma. Disc levels: Mild disc height loss at C5-C6. Moderate facet arthropathy throughout the cervical spine. Upper chest: Negative. Other: None. IMPRESSION: CT head: 1.  No acute intracranial abnormality. 2. Mild atrophy and chronic microvascular ischemic changes. 3. Prominent lateral ventricles, greater than expected based on the degree of atrophy. Correlate for normal pressure  hydrocephalus. CT cervical spine: 1.  No acute cervical spine fracture. Electronically Signed   By: Titus Dubin M.D.   On: 01/18/2019 17:22   Ct Cervical Spine Wo Contrast  Result Date: 01/18/2019 CLINICAL DATA:  Fall. EXAM: CT HEAD WITHOUT CONTRAST CT CERVICAL SPINE WITHOUT CONTRAST TECHNIQUE: Multidetector CT imaging of the head and cervical spine was performed following the standard protocol without intravenous contrast. Multiplanar CT image reconstructions of the cervical spine were also generated. COMPARISON:  CT paranasal sinuses dated December 20, 2017. FINDINGS: CT HEAD FINDINGS Brain: No evidence of acute infarction, hemorrhage, extra-axial collection or mass lesion/mass effect. Mild generalized cerebral atrophy. Scattered moderate periventricular and subcortical white matter hypodensities are nonspecific, but favored to reflect chronic microvascular ischemic changes. Prominent lateral ventricles, greater than expected based on the degree of atrophy. Vascular: Calcified atherosclerosis at the skullbase. No hyperdense vessel. Skull: Normal. Negative for fracture or focal lesion. Sinuses/Orbits: No acute finding.  Other: None. CT CERVICAL SPINE FINDINGS Alignment: No traumatic malalignment. Trace stepwise anterolisthesis from C3-C4 through C7-T1. Skull base and vertebrae: No acute fracture. No primary bone lesion or focal pathologic process. Soft tissues and spinal canal: No prevertebral fluid or swelling. No visible canal hematoma. Disc levels: Mild disc height loss at C5-C6. Moderate facet arthropathy throughout the cervical spine. Upper chest: Negative. Other: None. IMPRESSION: CT head: 1.  No acute intracranial abnormality. 2. Mild atrophy and chronic microvascular ischemic changes. 3. Prominent lateral ventricles, greater than expected based on the degree of atrophy. Correlate for normal pressure hydrocephalus. CT cervical spine: 1.  No acute cervical spine fracture. Electronically Signed   By:  Titus Dubin M.D.   On: 01/18/2019 17:22   Dg Femur Min 2 Views Left  Result Date: 01/18/2019 CLINICAL DATA:  [place].  Left leg pain. EXAM: LEFT FEMUR 2 VIEWS COMPARISON:  None. FINDINGS: Nondisplaced intertrochanteric fracture of the left hip. No femoral shaft fracture. The knee joint is maintained. The visualized left hemipelvis is intact. IMPRESSION: Nondisplaced intertrochanteric fracture of the left hip. Electronically Signed   By: Marijo Sanes M.D.   On: 01/18/2019 16:39    EKG: Independently reviewed. Sinus rhythm, QTc 524 ms.   Assessment/Plan   1. Left hip fracture  - Presents with severe left hip pain after a fall at home, found to have non-displaced left intertrochanteric femur fracture  - Orthopedic surgery is consulting and much appreciated  - Based on the available data, Shelly Cervantes presents an estimated 1% risk of perioperative MI or cardiac arrest  - Continue pain-control, NPO, CMS checks, hold ASA and lisinopril, correct electrolytes, control BP, supportive care    2. Prolonged QT interval  - QTc is 524 ms in ED  - Likely secondary to hypokalemia  - Continue cardiac monitoring, minimize QT-prolonging medications, correct electrolytes, and repeat EKG in am   3. Hypokalemia  - Serum potassium is 2.5 in ED  - 40 mEq IV potassium is being administered in ED, will give additional oral potassium and empiric mag, continue cardiac monitoring, and repeat chemistry panel in am    4. Hypertension with hypertensive urgency  - BP as high as 200/100 in ED  - Pain likely contributing  - Continue pain-control, hold lisinopril prior to surgery, use labetalol IVP's as needed    5. Dementia; anxiety; insomnia  - Hold Namenda, Aricept, and trazodone in light of prolonged QT    6. Breast cancer  - Status-post definitive surgery  - Continue Arimidex, continue oncology follow-up   7. Frequent falls  - Patient's husband is concerned that this was the patient's 4th fall in the past  month  - Ventriculomegaly on head CT raise concern for NPH and high-volume LP could be considered for confirmation     PPE: Mask, face shield  DVT prophylaxis: SCD's  Code Status: Full  Family Communication: Husband updated at bedside Consults called: Orthopedic surgery  Admission status: Inpatient     Vianne [facility], MD [facility] Pager [phone]  If 7PM-7AM, please contact night-coverage [email or web address] Password [facility]  01/18/2019, 7:27 PM

## 2019-01-18 NOTE — ED Notes (Signed)
EDP aware of pt K+

## 2019-01-18 NOTE — Consult Note (Signed)
Reason for Consult:Fracture of her Left Hip Referring Physician: DR. Earlie Lou  Shelly Cervantes is an 83 y.o. female.  HPI: Patient with Dementia,fell in her bedroom today and sustained a Closed Fracture of her Left Hip.  Past Medical History:  Diagnosis Date  . Alzheimer's disease ([place])   . Anxiety   . Breast cancer ([place])    left  . Depression   . GERD (gastroesophageal reflux disease)    occ  . High cholesterol   . Hypertension     Past Surgical History:  Procedure Laterality Date  . BREAST LUMPECTOMY WITH RADIOACTIVE SEED AND SENTINEL LYMPH NODE BIOPSY Left 11/25/2016   Procedure: LEFT BREAST LUMPECTOMY WITH RADIOACTIVE SEED AND AXILLARY SENTINEL LYMPH NODE BIOPSY;  Surgeon: Excell Seltzer, MD;  Location: [facility];  Service: General;  Laterality: Left;  . EYE SURGERY Bilateral    cataracts    Family History  Problem Relation Age of Onset  . Colon cancer Mother   . Cancer Father   . Lung cancer Father   . Dementia Sister     Social History:  reports that she has never smoked. She has never used smokeless tobacco. She reports current alcohol use of about 3.0 standard drinks of alcohol per week. She reports that she does not use drugs.  Allergies:  Allergies  Allergen Reactions  . Penicillins   . Codeine Rash    Medications: I have reviewed the patient's current medications.  Results for orders placed or performed during the hospital encounter of 01/18/19 (from the past 48 hour(s))  CBC with Differential     Status: Abnormal   Collection Time: 01/18/19  4:45 PM  Result Value Ref Range   WBC 9.5 4.0 - 10.5 K/uL   RBC 4.09 3.87 - 5.11 MIL/uL   Hemoglobin 12.2 12.0 - 15.0 g/dL   HCT 36.4 36.0 - 46.0 %   MCV 89.0 80.0 - 100.0 fL   MCH 29.8 26.0 - 34.0 pg   MCHC 33.5 30.0 - 36.0 g/dL   RDW 13.2 11.5 - 15.5 %   Platelets 204 150 - 400 K/uL   nRBC 0.0 0.0 - 0.2 %   Neutrophils Relative % 84 %   Neutro Abs 8.0 (H) 1.7 - 7.7 K/uL   Lymphocytes Relative 10 %   Lymphs  Abs 0.9 0.7 - 4.0 K/uL   Monocytes Relative 4 %   Monocytes Absolute 0.3 0.1 - 1.0 K/uL   Eosinophils Relative 1 %   Eosinophils Absolute 0.1 0.0 - 0.5 K/uL   Basophils Relative 0 %   Basophils Absolute 0.0 0.0 - 0.1 K/uL   Immature Granulocytes 1 %   Abs Immature Granulocytes 0.07 0.00 - 0.07 K/uL    Comment: Performed at [facility], [address] [place], [place] [address]  Comprehensive metabolic panel     Status: Abnormal   Collection Time: 01/18/19  4:45 PM  Result Value Ref Range   Sodium 138 135 - 145 mmol/L   Potassium 2.5 (LL) 3.5 - 5.1 mmol/L    Comment: CRITICAL RESULT CALLED TO, READ BACK BY AND VERIFIED WITH: BARWICK,J RN 01/18/2019 1745 JORDANS    Chloride 100 98 - 111 mmol/L   CO2 25 22 - 32 mmol/L   Glucose, Bld 126 (H) 70 - 99 mg/dL   BUN 11 8 - 23 mg/dL   Creatinine, Ser 0.88 0.44 - 1.00 mg/dL   Calcium 9.0 8.9 - 10.3 mg/dL   Total Protein 6.7 6.5 - 8.1  g/dL   Albumin 3.6 3.5 - 5.0 g/dL   AST 19 15 - 41 U/L   ALT 13 0 - 44 U/L   Alkaline Phosphatase 124 38 - 126 U/L   Total Bilirubin 0.8 0.3 - 1.2 mg/dL   GFR calc non Af Amer >60 >60 mL/min   GFR calc Af Amer >60 >60 mL/min   Anion gap 13 5 - 15    Comment: Performed at [facility] [address] [place], [place] [address]  Protime-INR     Status: None   Collection Time: 01/18/19  4:45 PM  Result Value Ref Range   Prothrombin Time 13.2 11.4 - 15.2 seconds   INR 1.0 0.8 - 1.2    Comment: (NOTE) INR goal varies based on device and disease states. Performed at [facility], [place] [address], [place], [place] [address]     Dg Chest 1 View  Result Date: 01/18/2019 CLINICAL DATA:  Fall, left leg pain EXAM: CHEST  1 VIEW COMPARISON:  07/11/2006 FINDINGS: The heart size and mediastinal contours are within normal limits. Both lungs are clear. The visualized skeletal structures are unremarkable. IMPRESSION: No acute cardiopulmonary findings. Electronically Signed   By: Davina Poke  M.D.   On: 01/18/2019 16:39   Dg Pelvis 1-2 Views  Result Date: 01/18/2019 CLINICAL DATA:  Fall.  Left leg pain. EXAM: PELVIS - 1-2 VIEW COMPARISON:  Pelvic and right hip x-rays dated December 25, 2018. FINDINGS: Acute largely nondisplaced fracture of the left intertrochanteric femur. More subacute appearing nondisplaced fractures of the right superior and inferior pubic rami, new since the prior study. No dislocation. The hip joint spaces are relatively preserved. Osteopenia. Soft tissues are unremarkable. IMPRESSION: 1. Acute nondisplaced left intertrochanteric femur fracture. 2. More subacute appearing nondisplaced fractures of the right superior and inferior pubic rami, new since the prior study. Electronically Signed   By: Titus Dubin M.D.   On: 01/18/2019 16:42   Ct Head Wo Contrast  Result Date: 01/18/2019 CLINICAL DATA:  Fall. EXAM: CT HEAD WITHOUT CONTRAST CT CERVICAL SPINE WITHOUT CONTRAST TECHNIQUE: Multidetector CT imaging of the head and cervical spine was performed following the standard protocol without intravenous contrast. Multiplanar CT image reconstructions of the cervical spine were also generated. COMPARISON:  CT paranasal sinuses dated December 20, 2017. FINDINGS: CT HEAD FINDINGS Brain: No evidence of acute infarction, hemorrhage, extra-axial collection or mass lesion/mass effect. Mild generalized cerebral atrophy. Scattered moderate periventricular and subcortical white matter hypodensities are nonspecific, but favored to reflect chronic microvascular ischemic changes. Prominent lateral ventricles, greater than expected based on the degree of atrophy. Vascular: Calcified atherosclerosis at the skullbase. No hyperdense vessel. Skull: Normal. Negative for fracture or focal lesion. Sinuses/Orbits: No acute finding. Other: None. CT CERVICAL SPINE FINDINGS Alignment: No traumatic malalignment. Trace stepwise anterolisthesis from C3-C4 through C7-T1. Skull base and vertebrae: No acute  fracture. No primary bone lesion or focal pathologic process. Soft tissues and spinal canal: No prevertebral fluid or swelling. No visible canal hematoma. Disc levels: Mild disc height loss at C5-C6. Moderate facet arthropathy throughout the cervical spine. Upper chest: Negative. Other: None. IMPRESSION: CT head: 1.  No acute intracranial abnormality. 2. Mild atrophy and chronic microvascular ischemic changes. 3. Prominent lateral ventricles, greater than expected based on the degree of atrophy. Correlate for normal pressure hydrocephalus. CT cervical spine: 1.  No acute cervical spine fracture. Electronically Signed   By: Titus Dubin M.D.   On: 01/18/2019 17:22   Ct  Cervical Spine Wo Contrast  Result Date: 01/18/2019 CLINICAL DATA:  Fall. EXAM: CT HEAD WITHOUT CONTRAST CT CERVICAL SPINE WITHOUT CONTRAST TECHNIQUE: Multidetector CT imaging of the head and cervical spine was performed following the standard protocol without intravenous contrast. Multiplanar CT image reconstructions of the cervical spine were also generated. COMPARISON:  CT paranasal sinuses dated December 20, 2017. FINDINGS: CT HEAD FINDINGS Brain: No evidence of acute infarction, hemorrhage, extra-axial collection or mass lesion/mass effect. Mild generalized cerebral atrophy. Scattered moderate periventricular and subcortical white matter hypodensities are nonspecific, but favored to reflect chronic microvascular ischemic changes. Prominent lateral ventricles, greater than expected based on the degree of atrophy. Vascular: Calcified atherosclerosis at the skullbase. No hyperdense vessel. Skull: Normal. Negative for fracture or focal lesion. Sinuses/Orbits: No acute finding. Other: None. CT CERVICAL SPINE FINDINGS Alignment: No traumatic malalignment. Trace stepwise anterolisthesis from C3-C4 through C7-T1. Skull base and vertebrae: No acute fracture. No primary bone lesion or focal pathologic process. Soft tissues and spinal canal: No  prevertebral fluid or swelling. No visible canal hematoma. Disc levels: Mild disc height loss at C5-C6. Moderate facet arthropathy throughout the cervical spine. Upper chest: Negative. Other: None. IMPRESSION: CT head: 1.  No acute intracranial abnormality. 2. Mild atrophy and chronic microvascular ischemic changes. 3. Prominent lateral ventricles, greater than expected based on the degree of atrophy. Correlate for normal pressure hydrocephalus. CT cervical spine: 1.  No acute cervical spine fracture. Electronically Signed   By: Titus Dubin M.D.   On: 01/18/2019 17:22   Dg Femur Min 2 Views Left  Result Date: 01/18/2019 CLINICAL DATA:  [place].  Left leg pain. EXAM: LEFT FEMUR 2 VIEWS COMPARISON:  None. FINDINGS: Nondisplaced intertrochanteric fracture of the left hip. No femoral shaft fracture. The knee joint is maintained. The visualized left hemipelvis is intact. IMPRESSION: Nondisplaced intertrochanteric fracture of the left hip. Electronically Signed   By: Marijo Sanes M.D.   On: 01/18/2019 16:39    Review of Systems  Constitutional: Negative.   HENT: Negative.   Eyes: Negative.   Cardiovascular: Negative.   Gastrointestinal: Negative.   Genitourinary: Negative.   Musculoskeletal: Positive for joint pain.  Skin: Negative.   Neurological: Negative.   Endo/Heme/Allergies: Negative.    Blood pressure (!) 166/103, pulse 72, temperature 97.6 F (36.4 C), temperature source Oral, resp. rate 19, SpO2 97 %. Physical Exam  Constitutional: She appears distressed.  HENT:  Head: Normocephalic.  Eyes: Pupils are equal, round, and reactive to light.  Neck: Normal range of motion.  Cardiovascular: Normal rate.  Respiratory: Effort normal.  GI: Soft.  Musculoskeletal:        General: Deformity present.  Neurological:  Dementia  Skin: Skin is warm.  Psychiatric:  Dementia    Assessment/Plan:Will notify DR. Alvan Dame to evaluate her for Surgery of her Left Hip.   Latanya Maudlin 01/18/2019,  7:14 PM

## 2019-01-18 NOTE — ED Provider Notes (Signed)
[facility] EMERGENCY DEPARTMENT Provider Note   CSN: [id number] Arrival date & time: 01/18/19  1526     History   Chief Complaint Chief Complaint  Patient presents with   Leg Injury   Leg Pain  fall   HPI Shelly Cervantes is a 83 y.o. female.     The history is provided by the EMS personnel and medical records. No language interpreter was used.  Hip Pain This is a new problem. The current episode started 1 to 2 hours ago. The problem occurs constantly. The problem has not changed since onset.Pertinent negatives include no chest pain, no abdominal pain, no headaches and no shortness of breath. The symptoms are aggravated by twisting. Nothing relieves the symptoms. She has tried nothing for the symptoms. The treatment provided no relief.    Past Medical History:  Diagnosis Date   Alzheimer's disease ([place])    Anxiety    Breast cancer ([place])    left   Depression    GERD (gastroesophageal reflux disease)    occ   High cholesterol    Hypertension     Patient Active Problem List   Diagnosis Date Noted   Late onset Alzheimer's disease without behavioral disturbance ([place]) 12/07/2018   Osteopenia 12/08/2016   Malignant neoplasm of upper-outer quadrant of left breast in female, estrogen receptor positive ([place]) 10/14/2016    Past Surgical History:  Procedure Laterality Date   BREAST LUMPECTOMY WITH RADIOACTIVE SEED AND SENTINEL LYMPH NODE BIOPSY Left 11/25/2016   Procedure: LEFT BREAST LUMPECTOMY WITH RADIOACTIVE SEED AND AXILLARY SENTINEL LYMPH NODE BIOPSY;  Surgeon: Excell Seltzer, MD;  Location: [facility];  Service: General;  Laterality: Left;   EYE SURGERY Bilateral    cataracts     OB History   No obstetric history on file.      Home Medications    Prior to Admission medications   Medication Sig Start Date End Date Taking? Authorizing Provider  anastrozole (ARIMIDEX) 1 MG tablet Take 1 tablet (1 mg total) by mouth daily. 06/05/18    [name], [name], MD  aspirin 81 MG tablet Take 81 mg by mouth daily.    [provider]  buPROPion (WELLBUTRIN XL) 300 MG 24 hr tablet Take 300 mg by mouth every morning. 05/27/16   [provider]  cholecalciferol (VITAMIN D3) 25 MCG (1000 UT) tablet Take 1 tablet (1,000 Units total) by mouth daily. 06/05/18   [name], [name], MD  donepezil (ARICEPT) 10 MG tablet Take 1 tablet (10 mg total) by mouth daily. 05/04/18   Star Age, MD  lisinopril (PRINIVIL,ZESTRIL) 20 MG tablet Take 20 mg by mouth daily. 05/27/16   [provider]  megestrol (MEGACE) 20 MG tablet 1 2 TABLETS TWICE A DAY ORALLY 30 DAY(S) 11/22/18   [provider]  memantine (NAMENDA) 10 MG tablet Take 10 mg by mouth 2 (two) times daily.    [provider]  Multiple Vitamin (MULTI-VITAMIN DAILY PO) Take 1 tablet by mouth daily.     [provider]  traZODone (DESYREL) 50 MG tablet Take 50 mg by mouth at bedtime. 05/27/16   [provider]    Family History Family History  Problem Relation Age of Onset   Colon cancer Mother    Cancer Father    Lung cancer Father    Dementia Sister     Social History Social History   Tobacco Use   Smoking status: Never Smoker   Smokeless tobacco: Never Used  Substance Use Topics   Alcohol use: Yes    Alcohol/week: 3.0 standard drinks    Types: 3 Glasses of wine per week    Comment: 1-2 glasses of wine 2x/week   Drug use: No     Allergies   Codeine   Review of Systems Review of Systems  Unable to perform ROS: Dementia  Constitutional: Negative for chills, diaphoresis, fatigue and fever.  HENT: Negative for congestion.   Respiratory: Negative for cough, chest tightness, shortness of breath and wheezing.   Cardiovascular: Negative for chest pain and palpitations.  Gastrointestinal: Negative for abdominal pain, constipation, diarrhea, nausea and vomiting.  Musculoskeletal: Negative for back pain, neck pain  and neck stiffness.  Skin: Negative for rash and wound.  Neurological: Negative for headaches.  Psychiatric/Behavioral: Positive for confusion.  All other systems reviewed and are negative.    Physical Exam Updated Vital Signs BP (!) 166/103    Pulse 72    Temp 97.6 F (36.4 C) (Oral)    Resp 19    SpO2 97%   Physical Exam Vitals signs and nursing note reviewed.  Constitutional:      General: She is not in acute distress.    Appearance: She is well-developed. She is not ill-appearing, toxic-appearing or diaphoretic.  HENT:     Head: Normocephalic and atraumatic.     Right Ear: External ear normal.     Left Ear: External ear normal.     Nose: Nose normal.     Mouth/Throat:     Pharynx: No oropharyngeal exudate.  Eyes:     Conjunctiva/sclera: Conjunctivae normal.     Pupils: Pupils are equal, round, and reactive to light.  Neck:     Musculoskeletal: Normal range of motion and neck supple. No muscular tenderness.  Cardiovascular:     Rate and Rhythm: Normal rate.     Heart sounds: No murmur.  Pulmonary:     Effort: No respiratory distress.     Breath sounds: No stridor. No wheezing, rhonchi or rales.  Chest:     Chest wall: No tenderness.  Abdominal:     General: Abdomen is flat. There is no distension.     Tenderness: There is no abdominal tenderness. There is no rebound.  Musculoskeletal:        General: Tenderness and signs of injury present. No deformity.     Left hip: She exhibits tenderness. She exhibits no laceration.     Right lower leg: No edema.     Left lower leg: No edema.       Legs:     Comments: Normal pulses and sensation in both legs.  Normal strength in the ankles.  Skin:    General: Skin is warm.     Capillary Refill: Capillary refill takes less than 2 seconds.     Findings: No erythema or rash.  Neurological:     General: No focal deficit present.     Mental Status: She is alert.     Sensory: No sensory deficit.     Motor: No weakness or  abnormal muscle tone.     Deep Tendon Reflexes: Reflexes are normal and symmetric.  Psychiatric:        Mood and Affect: Mood normal.      ED Treatments / Results  Labs (all labs ordered are listed, but only abnormal results are displayed) Labs Reviewed  CBC WITH DIFFERENTIAL/PLATELET - Abnormal; Notable for the following components:      Result Value  Neutro Abs 8.0 (*)    All other components within normal limits  COMPREHENSIVE METABOLIC PANEL - Abnormal; Notable for the following components:   Potassium 2.5 (*)    Glucose, Bld 126 (*)    All other components within normal limits  PROTIME-INR    EKG EKG Interpretation  Date/Time:  Thursday January 18 2019 15:33:28 EDT Ventricular Rate:  64 PR Interval:    QRS Duration: 88 QT Interval:  507 QTC Calculation: 524 R Axis:   24 Text Interpretation:  Sinus rhythm Minimal ST depression, lateral leads Prolonged QT interval when cmpared to prior, longer QTC and possible u wave.  No STEMI Confirmed by Antony Blackbird [phone]) on 01/18/2019 7:07:57 PM   Radiology Dg Chest 1 View  Result Date: 01/18/2019 CLINICAL DATA:  Fall, left leg pain EXAM: CHEST  1 VIEW COMPARISON:  07/11/2006 FINDINGS: The heart size and mediastinal contours are within normal limits. Both lungs are clear. The visualized skeletal structures are unremarkable. IMPRESSION: No acute cardiopulmonary findings. Electronically Signed   By: Davina Poke M.D.   On: 01/18/2019 16:39   Dg Pelvis 1-2 Views  Result Date: 01/18/2019 CLINICAL DATA:  Fall.  Left leg pain. EXAM: PELVIS - 1-2 VIEW COMPARISON:  Pelvic and right hip x-rays dated December 25, 2018. FINDINGS: Acute largely nondisplaced fracture of the left intertrochanteric femur. More subacute appearing nondisplaced fractures of the right superior and inferior pubic rami, new since the prior study. No dislocation. The hip joint spaces are relatively preserved. Osteopenia. Soft tissues are unremarkable.  IMPRESSION: 1. Acute nondisplaced left intertrochanteric femur fracture. 2. More subacute appearing nondisplaced fractures of the right superior and inferior pubic rami, new since the prior study. Electronically Signed   By: Titus Dubin M.D.   On: 01/18/2019 16:42   Ct Head Wo Contrast  Result Date: 01/18/2019 CLINICAL DATA:  Fall. EXAM: CT HEAD WITHOUT CONTRAST CT CERVICAL SPINE WITHOUT CONTRAST TECHNIQUE: Multidetector CT imaging of the head and cervical spine was performed following the standard protocol without intravenous contrast. Multiplanar CT image reconstructions of the cervical spine were also generated. COMPARISON:  CT paranasal sinuses dated December 20, 2017. FINDINGS: CT HEAD FINDINGS Brain: No evidence of acute infarction, hemorrhage, extra-axial collection or mass lesion/mass effect. Mild generalized cerebral atrophy. Scattered moderate periventricular and subcortical white matter hypodensities are nonspecific, but favored to reflect chronic microvascular ischemic changes. Prominent lateral ventricles, greater than expected based on the degree of atrophy. Vascular: Calcified atherosclerosis at the skullbase. No hyperdense vessel. Skull: Normal. Negative for fracture or focal lesion. Sinuses/Orbits: No acute finding. Other: None. CT CERVICAL SPINE FINDINGS Alignment: No traumatic malalignment. Trace stepwise anterolisthesis from C3-C4 through C7-T1. Skull base and vertebrae: No acute fracture. No primary bone lesion or focal pathologic process. Soft tissues and spinal canal: No prevertebral fluid or swelling. No visible canal hematoma. Disc levels: Mild disc height loss at C5-C6. Moderate facet arthropathy throughout the cervical spine. Upper chest: Negative. Other: None. IMPRESSION: CT head: 1.  No acute intracranial abnormality. 2. Mild atrophy and chronic microvascular ischemic changes. 3. Prominent lateral ventricles, greater than expected based on the degree of atrophy. Correlate for  normal pressure hydrocephalus. CT cervical spine: 1.  No acute cervical spine fracture. Electronically Signed   By: Titus Dubin M.D.   On: 01/18/2019 17:22   Ct Cervical Spine Wo Contrast  Result Date: 01/18/2019 CLINICAL DATA:  Fall. EXAM: CT HEAD WITHOUT CONTRAST CT CERVICAL SPINE WITHOUT CONTRAST TECHNIQUE: Multidetector CT imaging of the head and cervical  spine was performed following the standard protocol without intravenous contrast. Multiplanar CT image reconstructions of the cervical spine were also generated. COMPARISON:  CT paranasal sinuses dated December 20, 2017. FINDINGS: CT HEAD FINDINGS Brain: No evidence of acute infarction, hemorrhage, extra-axial collection or mass lesion/mass effect. Mild generalized cerebral atrophy. Scattered moderate periventricular and subcortical white matter hypodensities are nonspecific, but favored to reflect chronic microvascular ischemic changes. Prominent lateral ventricles, greater than expected based on the degree of atrophy. Vascular: Calcified atherosclerosis at the skullbase. No hyperdense vessel. Skull: Normal. Negative for fracture or focal lesion. Sinuses/Orbits: No acute finding. Other: None. CT CERVICAL SPINE FINDINGS Alignment: No traumatic malalignment. Trace stepwise anterolisthesis from C3-C4 through C7-T1. Skull base and vertebrae: No acute fracture. No primary bone lesion or focal pathologic process. Soft tissues and spinal canal: No prevertebral fluid or swelling. No visible canal hematoma. Disc levels: Mild disc height loss at C5-C6. Moderate facet arthropathy throughout the cervical spine. Upper chest: Negative. Other: None. IMPRESSION: CT head: 1.  No acute intracranial abnormality. 2. Mild atrophy and chronic microvascular ischemic changes. 3. Prominent lateral ventricles, greater than expected based on the degree of atrophy. Correlate for normal pressure hydrocephalus. CT cervical spine: 1.  No acute cervical spine fracture.  Electronically Signed   By: Titus Dubin M.D.   On: 01/18/2019 17:22   Dg Femur Min 2 Views Left  Result Date: 01/18/2019 CLINICAL DATA:  [place].  Left leg pain. EXAM: LEFT FEMUR 2 VIEWS COMPARISON:  None. FINDINGS: Nondisplaced intertrochanteric fracture of the left hip. No femoral shaft fracture. The knee joint is maintained. The visualized left hemipelvis is intact. IMPRESSION: Nondisplaced intertrochanteric fracture of the left hip. Electronically Signed   By: Marijo Sanes M.D.   On: 01/18/2019 16:39    Procedures Procedures (including critical care time)  Medications Ordered in ED Medications  potassium chloride 10 mEq in 100 mL IVPB (10 mEq Intravenous New Bag/Given 01/18/19 1903)  fentaNYL (SUBLIMAZE) injection 50 mcg (50 mcg Intravenous Given 01/18/19 1709)  fentaNYL (SUBLIMAZE) injection 50 mcg (50 mcg Intravenous Given 01/18/19 1851)     Initial Impression / Assessment and Plan / ED Course  I have reviewed the triage vital signs and the nursing notes.  Pertinent labs & imaging results that were available during my care of the patient were reviewed by me and considered in my medical decision making (see chart for details).        *SAMANTHIA ANDREPONT is a 83 y.o. female with a past medical history significant for Alzheimer's, dementia, hypertension, hypercholesterolemia, GERD, prior breast cancer, and prior falls who presents with a fall and left hip injury.  Patient brought in by EMS reports that patient had a witnessed fall by family today landing on her left hip.  Patient has been able to walk and has been screaming in pain in her left pelvis and left hip area.  Patient also complaining of pain in her left mid femur area.  Patient is confused and asking repetitive questions and does not member the fall.  Family has not yet arrived to be with patient and history is obtained by EMS.  On exam, patient does have tenderness in her left mid thigh and the left hip.  Pelvis is also tender  when pressed on.  Abdomen otherwise nontender.  Lungs clear and chest is nontender.  Patient in cervical immobilization collar on arrival and is confused.  Otherwise patient is having normal sensation in all extremities and has normal strength in feet  and hands.  Good pulses in all extremities.  No laceration seen.  Patient will have CT of the head neck given the likely distracting injury and the patient's altered mental status with confusion and repetitive questioning.  Will get chest x-ray for preoperative management assuming there is a hip fracture and will get pelvis, left hip, and left femur x-rays.  Patient on screening preoperative labs.  Anticipate reassess after work-up.  Patient was given fentanyl for pain control initially.  7:06 PM X-ray returned showing evidence of a left intertrochanteric femur fracture and likely old pelvic fractures.  Spoke with Dr. Gladstone Lighter with [facility] who will come see the patient.  Patient was also found to have hypokalemia of 2.5.  Patient was given IV potassium.  Patient be admitted to medicine service for further management and orthopedics team will discuss management.  Patient will be admitted.   Final Clinical Impressions(s) / ED Diagnoses   Final diagnoses:  Hypokalemia  Closed fracture of left hip, initial encounter ([place])  Fall, initial encounter    ED Discharge Orders    None      Clinical Impression: 1. Hypokalemia   2. Pain   3. Hip fracture ([place])   4. Closed fracture of left hip, initial encounter ([place])   5. Fall, initial encounter     Disposition: Admit  This note was prepared with assistance of Systems analyst. Occasional wrong-word or sound-a-like substitutions may have occurred due to the inherent limitations of voice recognition software.      Sally Reimers, Gwenyth Allegra, MD 01/19/19 [phone]

## 2019-01-18 NOTE — Progress Notes (Signed)
I have discussed this patient's case with my partner Dr. Gladstone Lighter.  I will plan for operative fixation of the left hip fracture tomorrow afternoon.  She will be able to weight-bear as tolerated following surgery and will be under the care of the hospitalist service.  Full consultation and discussion with the patient will occur tomorrow before surgery.

## 2019-01-19 ENCOUNTER — Inpatient Hospital Stay (HOSPITAL_COMMUNITY): Payer: Medicare HMO | Admitting: Anesthesiology

## 2019-01-19 ENCOUNTER — Encounter (HOSPITAL_COMMUNITY): Payer: Self-pay | Admitting: Anesthesiology

## 2019-01-19 ENCOUNTER — Other Ambulatory Visit: Payer: Self-pay

## 2019-01-19 ENCOUNTER — Encounter (HOSPITAL_COMMUNITY)
Admission: EM | Disposition: A | Discharge status: Skilled Nursing Facility | Payer: Self-pay | Source: Home / Self Care | Attending: Internal Medicine

## 2019-01-19 ENCOUNTER — Inpatient Hospital Stay (HOSPITAL_COMMUNITY): Payer: Medicare HMO

## 2019-01-19 DIAGNOSIS — S72002A Fracture of unspecified part of neck of left femur, initial encounter for closed fracture: Secondary | ICD-10-CM

## 2019-01-19 HISTORY — PX: FEMUR IM NAIL: SHX1597

## 2019-01-19 LAB — CBC WITH DIFFERENTIAL/PLATELET
Abs Immature Granulocytes: 0.06 10*3/uL (ref 0.00–0.07)
Basophils Absolute: 0 10*3/uL (ref 0.0–0.1)
Basophils Relative: 0 %
Eosinophils Absolute: 0 10*3/uL (ref 0.0–0.5)
Eosinophils Relative: 0 %
HCT: 32 % — ABNORMAL LOW (ref 36.0–46.0)
Hemoglobin: 11.2 g/dL — ABNORMAL LOW (ref 12.0–15.0)
Immature Granulocytes: 1 %
Lymphocytes Relative: 9 %
Lymphs Abs: 0.8 10*3/uL (ref 0.7–4.0)
MCH: 30.4 pg (ref 26.0–34.0)
MCHC: 35 g/dL (ref 30.0–36.0)
MCV: 87 fL (ref 80.0–100.0)
Monocytes Absolute: 0.6 10*3/uL (ref 0.1–1.0)
Monocytes Relative: 7 %
Neutro Abs: 6.9 10*3/uL (ref 1.7–7.7)
Neutrophils Relative %: 83 %
Platelets: 182 10*3/uL (ref 150–400)
RBC: 3.68 MIL/uL — ABNORMAL LOW (ref 3.87–5.11)
RDW: 13 % (ref 11.5–15.5)
WBC: 8.3 10*3/uL (ref 4.0–10.5)
nRBC: 0 % (ref 0.0–0.2)

## 2019-01-19 LAB — SURGICAL PCR SCREEN
MRSA, PCR: NEGATIVE
Staphylococcus aureus: NEGATIVE

## 2019-01-19 LAB — COMPREHENSIVE METABOLIC PANEL
ALT: 16 U/L (ref 0–44)
AST: 24 U/L (ref 15–41)
Albumin: 3.5 g/dL (ref 3.5–5.0)
Alkaline Phosphatase: 117 U/L (ref 38–126)
Anion gap: 14 (ref 5–15)
BUN: 10 mg/dL (ref 8–23)
CO2: 23 mmol/L (ref 22–32)
Calcium: 9.3 mg/dL (ref 8.9–10.3)
Chloride: 101 mmol/L (ref 98–111)
Creatinine, Ser: 0.96 mg/dL (ref 0.44–1.00)
GFR calc Af Amer: >60 mL/min (ref >60)
GFR calc non Af Amer: 55 mL/min — ABNORMAL LOW (ref >60)
Glucose, Bld: 122 mg/dL — ABNORMAL HIGH (ref 70–99)
Potassium: 3.2 mmol/L — ABNORMAL LOW (ref 3.5–5.1)
Sodium: 138 mmol/L (ref 135–145)
Total Bilirubin: 1.2 mg/dL (ref 0.3–1.2)
Total Protein: 6.6 g/dL (ref 6.5–8.1)

## 2019-01-19 LAB — APTT: aPTT: 27 seconds (ref 24–36)

## 2019-01-19 LAB — PROTIME-INR
INR: 1.1 (ref 0.8–1.2)
Prothrombin Time: 14.2 seconds (ref 11.4–15.2)

## 2019-01-19 LAB — MAGNESIUM: Magnesium: 1.6 mg/dL — ABNORMAL LOW (ref 1.7–2.4)

## 2019-01-19 SURGERY — INSERTION, INTRAMEDULLARY ROD, FEMUR
Anesthesia: General | Site: Leg Upper | Laterality: Left

## 2019-01-19 MED ORDER — BUPROPION HCL ER (XL) 150 MG PO TB24
150.0000 mg | ORAL_TABLET | Freq: Every day | ORAL | Status: DC
  Administered 2019-01-19 – 2019-01-27 (×8): 150 mg via ORAL
  Filled 2019-01-19 (×7): qty 1

## 2019-01-19 MED ORDER — LIDOCAINE 2% (20 MG/ML) 5 ML SYRINGE
INTRAMUSCULAR | Status: DC | PRN
  Administered 2019-01-19: 60 mg via INTRAVENOUS

## 2019-01-19 MED ORDER — LISINOPRIL 20 MG PO TABS
20.0000 mg | ORAL_TABLET | Freq: Two times a day (BID) | ORAL | Status: DC
  Administered 2019-01-20: 20 mg via ORAL
  Filled 2019-01-19 (×3): qty 1

## 2019-01-19 MED ORDER — FENTANYL CITRATE (PF) 100 MCG/2ML IJ SOLN
INTRAMUSCULAR | Status: DC | PRN
  Administered 2019-01-19: 100 ug via INTRAVENOUS
  Administered 2019-01-19: 50 ug via INTRAVENOUS

## 2019-01-19 MED ORDER — ROCURONIUM BROMIDE 10 MG/ML (PF) SYRINGE
PREFILLED_SYRINGE | INTRAVENOUS | Status: DC | PRN
  Administered 2019-01-19: 100 mg via INTRAVENOUS

## 2019-01-19 MED ORDER — FLUTICASONE PROPIONATE 50 MCG/ACT NA SUSP
1.0000 | Freq: Two times a day (BID) | NASAL | Status: DC
  Administered 2019-01-19 – 2019-01-27 (×10): 1 via NASAL
  Filled 2019-01-19: qty 16

## 2019-01-19 MED ORDER — OXYCODONE HCL 5 MG/5ML PO SOLN: 5.0000 mg | Freq: Once | ORAL | Status: DC | PRN

## 2019-01-19 MED ORDER — PHENYLEPHRINE 40 MCG/ML (10ML) SYRINGE FOR IV PUSH (FOR BLOOD PRESSURE SUPPORT)
PREFILLED_SYRINGE | INTRAVENOUS | Status: AC
  Filled 2019-01-19: qty 10

## 2019-01-19 MED ORDER — ONDANSETRON HCL 4 MG/2ML IJ SOLN
INTRAMUSCULAR | Status: AC
  Filled 2019-01-19: qty 2

## 2019-01-19 MED ORDER — ONDANSETRON HCL 4 MG/2ML IJ SOLN
INTRAMUSCULAR | Status: DC | PRN
  Administered 2019-01-19: 4 mg via INTRAVENOUS

## 2019-01-19 MED ORDER — DONEPEZIL HCL 5 MG PO TABS
5.0000 mg | ORAL_TABLET | Freq: Every day | ORAL | Status: DC
  Administered 2019-01-24 – 2019-01-26 (×3): 5 mg via ORAL
  Filled 2019-01-19 (×7): qty 1

## 2019-01-19 MED ORDER — MEGESTROL ACETATE 20 MG PO TABS
20.0000 mg | ORAL_TABLET | Freq: Every day | ORAL | Status: DC
  Administered 2019-01-20 – 2019-01-27 (×7): 20 mg via ORAL
  Filled 2019-01-19 (×9): qty 1

## 2019-01-19 MED ORDER — SODIUM CHLORIDE 0.9 % IV SOLN
INTRAVENOUS | Status: DC
  Administered 2019-01-19: 19:00:00 via INTRAVENOUS

## 2019-01-19 MED ORDER — 0.9 % SODIUM CHLORIDE (POUR BTL) OPTIME
TOPICAL | Status: DC | PRN
  Administered 2019-01-19: 1000 mL

## 2019-01-19 MED ORDER — OXYCODONE HCL 5 MG PO TABS: 5.0000 mg | ORAL_TABLET | Freq: Once | ORAL | Status: DC | PRN

## 2019-01-19 MED ORDER — HYDROMORPHONE HCL 1 MG/ML IJ SOLN: 0.2500 mg | INTRAMUSCULAR | Status: DC | PRN

## 2019-01-19 MED ORDER — DEXAMETHASONE SODIUM PHOSPHATE 10 MG/ML IJ SOLN
INTRAMUSCULAR | Status: AC
  Filled 2019-01-19: qty 1

## 2019-01-19 MED ORDER — SUGAMMADEX SODIUM 200 MG/2ML IV SOLN
INTRAVENOUS | Status: DC | PRN
  Administered 2019-01-19: 90 mg via INTRAVENOUS

## 2019-01-19 MED ORDER — FENTANYL CITRATE (PF) 250 MCG/5ML IJ SOLN
INTRAMUSCULAR | Status: AC
  Filled 2019-01-19: qty 5

## 2019-01-19 MED ORDER — DEXAMETHASONE SODIUM PHOSPHATE 4 MG/ML IJ SOLN
INTRAMUSCULAR | Status: DC | PRN
  Administered 2019-01-19: 4 mg via INTRAVENOUS

## 2019-01-19 MED ORDER — PROPOFOL 10 MG/ML IV BOLUS
INTRAVENOUS | Status: DC | PRN
  Administered 2019-01-19: 120 mg via INTRAVENOUS

## 2019-01-19 MED ORDER — MAGNESIUM SULFATE 2 GM/50ML IV SOLN
2.0000 g | Freq: Once | INTRAVENOUS | Status: AC
  Administered 2019-01-19: 2 g via INTRAVENOUS
  Filled 2019-01-19: qty 50

## 2019-01-19 MED ORDER — POVIDONE-IODINE 10 % EX SWAB: 2.0000 "application " | Freq: Once | CUTANEOUS | Status: DC

## 2019-01-19 MED ORDER — METHOCARBAMOL 500 MG PO TABS
500.0000 mg | ORAL_TABLET | Freq: Four times a day (QID) | ORAL | Status: DC | PRN
  Filled 2019-01-19: qty 1

## 2019-01-19 MED ORDER — POTASSIUM CHLORIDE CRYS ER 20 MEQ PO TBCR
40.0000 meq | EXTENDED_RELEASE_TABLET | Freq: Once | ORAL | Status: AC
  Administered 2019-01-19: 40 meq via ORAL
  Filled 2019-01-19: qty 2

## 2019-01-19 MED ORDER — HALOPERIDOL LACTATE 5 MG/ML IJ SOLN
2.0000 mg | Freq: Once | INTRAMUSCULAR | Status: AC
  Administered 2019-01-19: 2 mg via INTRAMUSCULAR
  Filled 2019-01-19: qty 1

## 2019-01-19 MED ORDER — ACETAMINOPHEN 500 MG PO TABS
500.0000 mg | ORAL_TABLET | Freq: Four times a day (QID) | ORAL | Status: AC
  Administered 2019-01-20: 500 mg via ORAL
  Filled 2019-01-19 (×4): qty 1

## 2019-01-19 MED ORDER — HYDRALAZINE HCL 20 MG/ML IJ SOLN
5.0000 mg | INTRAMUSCULAR | Status: DC | PRN
  Filled 2019-01-19 (×2): qty 1

## 2019-01-19 MED ORDER — HYDROXYZINE HCL 25 MG PO TABS
25.0000 mg | ORAL_TABLET | Freq: Three times a day (TID) | ORAL | Status: DC | PRN
  Filled 2019-01-19: qty 1

## 2019-01-19 MED ORDER — TIMOLOL MALEATE 0.5 % OP SOLN
1.0000 [drp] | Freq: Every day | OPHTHALMIC | Status: DC
  Administered 2019-01-20 – 2019-01-27 (×8): 1 [drp] via OPHTHALMIC
  Filled 2019-01-19: qty 5

## 2019-01-19 MED ORDER — LIDOCAINE 2% (20 MG/ML) 5 ML SYRINGE
INTRAMUSCULAR | Status: AC
  Filled 2019-01-19: qty 5

## 2019-01-19 MED ORDER — LACTATED RINGERS IV SOLN
INTRAVENOUS | Status: DC
  Administered 2019-01-19 – 2019-01-23 (×3): via INTRAVENOUS

## 2019-01-19 MED ORDER — PNEUMOCOCCAL VAC POLYVALENT 25 MCG/0.5ML IJ INJ
0.5000 mL | INJECTION | INTRAMUSCULAR | Status: AC
  Administered 2019-01-20: 0.5 mL via INTRAMUSCULAR
  Filled 2019-01-19: qty 0.5

## 2019-01-19 MED ORDER — PROPOFOL 10 MG/ML IV BOLUS
INTRAVENOUS | Status: AC
  Filled 2019-01-19: qty 20

## 2019-01-19 MED ORDER — METHOCARBAMOL 1000 MG/10ML IJ SOLN
500.0000 mg | Freq: Four times a day (QID) | INTRAVENOUS | Status: DC | PRN
  Filled 2019-01-19: qty 5

## 2019-01-19 MED ORDER — TRAZODONE HCL 50 MG PO TABS
50.0000 mg | ORAL_TABLET | Freq: Every day | ORAL | Status: DC
  Administered 2019-01-24 – 2019-01-26 (×3): 50 mg via ORAL
  Filled 2019-01-19 (×7): qty 1

## 2019-01-19 MED ORDER — PANTOPRAZOLE SODIUM 40 MG PO TBEC
40.0000 mg | DELAYED_RELEASE_TABLET | Freq: Every day | ORAL | Status: DC
  Administered 2019-01-20 – 2019-01-27 (×7): 40 mg via ORAL
  Filled 2019-01-19 (×7): qty 1

## 2019-01-19 MED ORDER — MEMANTINE HCL 10 MG PO TABS
10.0000 mg | ORAL_TABLET | Freq: Every day | ORAL | Status: DC
  Administered 2019-01-20 – 2019-01-27 (×7): 10 mg via ORAL
  Filled 2019-01-19 (×7): qty 1

## 2019-01-19 MED ORDER — BUPIVACAINE HCL (PF) 0.25 % IJ SOLN
INTRAMUSCULAR | Status: AC
  Filled 2019-01-19: qty 30

## 2019-01-19 MED ORDER — ENSURE PRE-SURGERY PO LIQD
296.0000 mL | Freq: Once | ORAL | Status: AC
  Filled 2019-01-19: qty 296

## 2019-01-19 MED ORDER — ASPIRIN 81 MG PO CHEW
81.0000 mg | CHEWABLE_TABLET | Freq: Every day | ORAL | Status: DC
  Administered 2019-01-19 – 2019-01-26 (×4): 81 mg via ORAL
  Filled 2019-01-19 (×8): qty 1

## 2019-01-19 MED ORDER — HYDROXYZINE PAMOATE 25 MG PO CAPS: 25.0000 mg | ORAL_CAPSULE | Freq: Three times a day (TID) | ORAL | Status: DC | PRN

## 2019-01-19 MED ORDER — ENSURE ENLIVE PO LIQD
237.0000 mL | Freq: Two times a day (BID) | ORAL | Status: DC
  Administered 2019-01-20 – 2019-01-27 (×10): 237 mL via ORAL

## 2019-01-19 MED ORDER — DOCUSATE SODIUM 100 MG PO CAPS
100.0000 mg | ORAL_CAPSULE | Freq: Two times a day (BID) | ORAL | Status: DC
  Administered 2019-01-20 – 2019-01-27 (×10): 100 mg via ORAL
  Filled 2019-01-19 (×14): qty 1

## 2019-01-19 MED ORDER — CHLORHEXIDINE GLUCONATE 4 % EX LIQD: 60.0000 mL | Freq: Once | CUTANEOUS | Status: AC

## 2019-01-19 MED ORDER — SERTRALINE HCL 100 MG PO TABS
100.0000 mg | ORAL_TABLET | Freq: Every day | ORAL | Status: DC
  Administered 2019-01-20 – 2019-01-27 (×7): 100 mg via ORAL
  Filled 2019-01-19 (×7): qty 1

## 2019-01-19 MED ORDER — CEFAZOLIN SODIUM-DEXTROSE 2-4 GM/100ML-% IV SOLN: 2.0000 g | INTRAVENOUS | Status: DC

## 2019-01-19 MED ORDER — PHENYLEPHRINE 40 MCG/ML (10ML) SYRINGE FOR IV PUSH (FOR BLOOD PRESSURE SUPPORT)
PREFILLED_SYRINGE | INTRAVENOUS | Status: DC | PRN
  Administered 2019-01-19 (×2): 80 ug via INTRAVENOUS

## 2019-01-19 SURGICAL SUPPLY — 48 items
ALCOHOL 70% 16 OZ (MISCELLANEOUS) ×2 IMPLANT
BIT DRILL FLUTED FEMUR 4.2/3 (BIT) ×1 IMPLANT
BLADE SURG 10 STRL SS (BLADE) ×1 IMPLANT
BNDG COHESIVE 4X5 TAN STRL (GAUZE/BANDAGES/DRESSINGS) ×2 IMPLANT
BNDG COHESIVE 6X5 TAN STRL LF (GAUZE/BANDAGES/DRESSINGS) ×2 IMPLANT
COVER PERINEAL POST (MISCELLANEOUS) ×2 IMPLANT
COVER SURGICAL LIGHT HANDLE (MISCELLANEOUS) ×2 IMPLANT
COVER WAND RF STERILE (DRAPES) ×2 IMPLANT
DRAPE C-ARMOR (DRAPES) IMPLANT
DRAPE HALF SHEET 40X57 (DRAPES) IMPLANT
DRAPE INCISE IOBAN 66X45 STRL (DRAPES) IMPLANT
DRAPE INCISE IOBAN 85X60 (DRAPES) ×1 IMPLANT
DRAPE ORTHO SPLIT 77X108 STRL (DRAPES)
DRAPE STERI IOBAN 125X83 (DRAPES) ×5 IMPLANT
DRAPE SURG ORHT 6 SPLT 77X108 (DRAPES) IMPLANT
DRSG MEPILEX BORDER 4X4 (GAUZE/BANDAGES/DRESSINGS) IMPLANT
DRSG TEGADERM 2-3/8X2-3/4 SM (GAUZE/BANDAGES/DRESSINGS) IMPLANT
DRSG TEGADERM 4X4.75 (GAUZE/BANDAGES/DRESSINGS) ×4 IMPLANT
DRSG XEROFORM 1X8 (GAUZE/BANDAGES/DRESSINGS) ×1 IMPLANT
DURAPREP 26ML APPLICATOR (WOUND CARE) ×2 IMPLANT
ELECT REM PT RETURN 9FT ADLT (ELECTROSURGICAL) ×2
ELECTRODE REM PT RTRN 9FT ADLT (ELECTROSURGICAL) ×1 IMPLANT
FACESHIELD WRAPAROUND (MASK) IMPLANT
FACESHIELD WRAPAROUND OR TEAM (MASK) IMPLANT
GAUZE SPONGE 4X4 12PLY STRL LF (GAUZE/BANDAGES/DRESSINGS) ×2 IMPLANT
GAUZE XEROFORM 1X8 LF (GAUZE/BANDAGES/DRESSINGS) ×2 IMPLANT
GLOVE BIO SURGEON STRL SZ7.5 (GLOVE) ×2 IMPLANT
GLOVE BIOGEL PI IND STRL 8 (GLOVE) ×1 IMPLANT
GLOVE BIOGEL PI INDICATOR 8 (GLOVE) ×1
GOWN STRL REUS W/ TWL LRG LVL3 (GOWN DISPOSABLE) ×2 IMPLANT
GOWN STRL REUS W/ TWL XL LVL3 (GOWN DISPOSABLE) ×1 IMPLANT
GOWN STRL REUS W/TWL LRG LVL3 (GOWN DISPOSABLE) ×4
GOWN STRL REUS W/TWL XL LVL3 (GOWN DISPOSABLE) ×2
GUIDEWIRE 3.2X400 (WIRE) ×2 IMPLANT
KIT BASIN OR (CUSTOM PROCEDURE TRAY) ×2 IMPLANT
KIT TURNOVER KIT B (KITS) ×2 IMPLANT
MANIFOLD NEPTUNE II (INSTRUMENTS) ×2 IMPLANT
NAIL TROCH FIX 10X170 130 (Nail) ×1 IMPLANT
NS IRRIG 1000ML POUR BTL (IV SOLUTION) ×2 IMPLANT
PACK GENERAL/GYN (CUSTOM PROCEDURE TRAY) ×2 IMPLANT
PAD ARMBOARD 7.5X6 YLW CONV (MISCELLANEOUS) ×4 IMPLANT
SCREW LOCKING 5.0X34MM (Screw) ×1 IMPLANT
SCREW TFNA 90MM HIP (Screw) ×1 IMPLANT
STAPLER VISISTAT 35W (STAPLE) ×2 IMPLANT
STRIP CLOSURE SKIN 1/2X4 (GAUZE/BANDAGES/DRESSINGS) IMPLANT
SUT MNCRL AB 3-0 PS2 18 (SUTURE) ×1 IMPLANT
SUT VIC AB 2-0 CT1 27 (SUTURE) ×4
SUT VIC AB 2-0 CT1 TAPERPNT 27 (SUTURE) ×2 IMPLANT

## 2019-01-19 NOTE — Progress Notes (Signed)
PROGRESS NOTE    MATTISYN DENHERDER  [id number] DOB: Oct 29, 1935 DOA: 01/18/2019 PCP: [name], MD     Brief Narrative:  MAZIYAH PFIESTER is a 83 y.o. female with medical history significant for Alzheimer's dementia, breast cancer status post definitive surgery, hypertension, and anxiety, now presenting to the emergency department for evaluation of left hip pain after a fall.  Per husband report, patient has had recent gait difficulty with frequent falls, reporting that this is the fourth fall she has had in the past few weeks.  She had not been complaining of anything leading up to this and seemed to be having an uneventful day.  Patient denies losing consciousness or experiencing any chest pain or lightheadedness prior to the fall.  Her husband heard her yell from another room, found her on the floor complaining of severe left hip pain, and called EMS. Radiographs of the hip and pelvis demonstrate acute nondisplaced left intertrochanteric femur fracture as well as more subacute appearing nondisplaced fractures of the right superior and inferior pubic rami. Orthopedic surgery was consulted.   New events last 24 hours / Subjective: Patient remains pleasantly confused, does not readily admit to multiple falls recently.  She initially denies any pain, then admits to left hip pain.  She is planned for the OR for her left hip later this afternoon.  Assessment & Plan:   Principal Problem:   Closed fracture of left hip (HCC) Active Problems:   Malignant neoplasm of upper-outer quadrant of left breast in female, estrogen receptor positive ([place])   Late onset Alzheimer's disease without behavioral disturbance (HCC)   Hypokalemia   Hypertensive urgency   Prolonged QT interval   Left hip fracture after fall -Orthopedic surgery consulted -Planning for OR later today, estimated 1% risk of perioperative MI or cardiac arrest -Pain control -PT OT postoperatively  Prolonged QT interval -EKG  reviewed independently today which revealed normal sinus rhythm with QTC 482, which is an improvement from the EKG in the emergency department which revealed QTC 524  Hypokalemia -Replace, trend -Check magnesium  Essential hypertension -Resume lisinopril  Alzheimer's dementia -Resume Namenda, Aricept  Frequent falls -CT head revealed ventriculomegaly.  Concern for NPH.  Will start work-up for MRI brain postoperatively and consider high-volume LP versus work-up as an outpatient  Depression, anxiety -Resume Wellbutrin, Atarax as needed, Zoloft, trazodone nightly   DVT prophylaxis: SCD Code Status: Full code Family Communication: None Disposition Plan: Pending postoperative course   Consultants:   Orthopedic surgery  Procedures:   None  Antimicrobials:  Anti-infectives (From admission, onward)   Start     Dose/Rate Route Frequency Ordered Stop   01/19/19 1200  ceFAZolin (ANCEF) IVPB 2g/100 mL premix     2 g 200 mL/hr over 30 Minutes Intravenous To Short Stay 01/19/19 0149 01/20/19 1200   01/19/19 0600  vancomycin (VANCOCIN) IVPB 1000 mg/200 mL premix     1,000 mg 200 mL/hr over 60 Minutes Intravenous On call to O.R. 01/18/19 2035 01/20/19 0559        Objective: Vitals:   01/19/19 0135 01/19/19 0407 01/19/19 0807 01/19/19 0809  BP: (!) 151/82  (!) 200/85 (!) 190/102  Pulse: 85 68 72 71  Resp: 16  17   Temp: 97.7 F (36.5 C) (!) 97.5 F (36.4 C) 98.3 F (36.8 C)   TempSrc: Oral Oral Oral   SpO2:  95% 99%   Weight: 45.4 kg     Height: 5\' 5"  (1.651 m)  Intake/Output Summary (Last 24 hours) at 01/19/2019 1150 Last data filed at 01/19/2019 0900 Gross per 24 hour  Intake 1067.75 ml  Output 550 ml  Net 517.75 ml   Filed Weights   01/19/19 0135  Weight: 45.4 kg    Examination:  General exam: Appears calm and comfortable, pleasantly confused, in no acute distress Respiratory system: Clear to auscultation. Respiratory effort normal. No respiratory  distress. No conversational dyspnea.  Cardiovascular system: S1 & S2 heard, RRR. No murmurs. No pedal edema. Gastrointestinal system: Abdomen is nondistended, soft and nontender. Normal bowel sounds heard. Central nervous system: Alert, nonfocal examination Skin: No rashes, lesions or ulcers on exposed skin  Psychiatry: Dementia  Data Reviewed: I have personally reviewed following labs and imaging studies  CBC: Recent Labs  Lab 01/18/19 1645 01/19/19 0236  WBC 9.5 8.3  NEUTROABS 8.0* 6.9  HGB 12.2 11.2*  HCT 36.4 32.0*  MCV 89.0 87.0  PLT 204 [id number]   Basic Metabolic Panel: Recent Labs  Lab 01/18/19 1645 01/19/19 0236  NA 138 138  K 2.5* 3.2*  CL 100 101  CO2 25 23  GLUCOSE 126* 122*  BUN 11 10  CREATININE 0.88 0.96  CALCIUM 9.0 9.3   GFR: Estimated Creatinine Clearance: 32.9 mL/min (by C-G formula based on SCr of 0.96 mg/dL). Liver Function Tests: Recent Labs  Lab 01/18/19 1645 01/19/19 0236  AST 19 24  ALT 13 16  ALKPHOS 124 117  BILITOT 0.8 1.2  PROT 6.7 6.6  ALBUMIN 3.6 3.5   No results for input(s): LIPASE, AMYLASE in the last 168 hours. No results for input(s): AMMONIA in the last 168 hours. Coagulation Profile: Recent Labs  Lab 01/18/19 1645 01/19/19 0236  INR 1.0 1.1   Cardiac Enzymes: No results for input(s): CKTOTAL, CKMB, CKMBINDEX, TROPONINI in the last 168 hours. BNP (last 3 results) No results for input(s): PROBNP in the last 8760 hours. HbA1C: No results for input(s): HGBA1C in the last 72 hours. CBG: No results for input(s): GLUCAP in the last 168 hours. Lipid Profile: No results for input(s): CHOL, HDL, LDLCALC, TRIG, CHOLHDL, LDLDIRECT in the last 72 hours. Thyroid Function Tests: No results for input(s): TSH, T4TOTAL, FREET4, T3FREE, THYROIDAB in the last 72 hours. Anemia Panel: No results for input(s): VITAMINB12, FOLATE, FERRITIN, TIBC, IRON, RETICCTPCT in the last 72 hours. Sepsis Labs: No results for input(s): PROCALCITON,  LATICACIDVEN in the last 168 hours.  Recent Results (from the past 240 hour(s))  SARS Coronavirus 2 by RT PCR (hospital order, performed in [facility] hospital lab) Nasopharyngeal Nasopharyngeal Swab     Status: None   Collection Time: 01/18/19  7:17 PM   Specimen: Nasopharyngeal Swab  Result Value Ref Range Status   SARS Coronavirus 2 NEGATIVE NEGATIVE Final    Comment: (NOTE) If result is NEGATIVE SARS-CoV-2 target nucleic acids are NOT DETECTED. The SARS-CoV-2 RNA is generally detectable in upper and lower  respiratory specimens during the acute phase of infection. The lowest  concentration of SARS-CoV-2 viral copies this assay can detect is 250  copies / mL. A negative result does not preclude SARS-CoV-2 infection  and should not be used as the sole basis for treatment or other  patient management decisions.  A negative result may occur with  improper specimen collection / handling, submission of specimen other  than nasopharyngeal swab, presence of viral mutation(s) within the  areas targeted by this assay, and inadequate number of viral copies  (<250 copies / mL). A negative result  must be combined with clinical  observations, patient history, and epidemiological information. If result is POSITIVE SARS-CoV-2 target nucleic acids are DETECTED. The SARS-CoV-2 RNA is generally detectable in upper and lower  respiratory specimens dur ing the acute phase of infection.  Positive  results are indicative of active infection with SARS-CoV-2.  Clinical  correlation with patient history and other diagnostic information is  necessary to determine patient infection status.  Positive results do  not rule out bacterial infection or co-infection with other viruses. If result is PRESUMPTIVE POSTIVE SARS-CoV-2 nucleic acids MAY BE PRESENT.   A presumptive positive result was obtained on the submitted specimen  and confirmed on repeat testing.  While 2019 novel coronavirus  (SARS-CoV-2)  nucleic acids may be present in the submitted sample  additional confirmatory testing may be necessary for epidemiological  and / or clinical management purposes  to differentiate between  SARS-CoV-2 and other Sarbecovirus currently known to infect humans.  If clinically indicated additional testing with an alternate test  methodology [id number]) is advised. The SARS-CoV-2 RNA is generally  detectable in upper and lower respiratory sp ecimens during the acute  phase of infection. The expected result is Negative. Fact Sheet for Patients:  StrictlyIdeas.no Fact Sheet for Healthcare Providers: BankingDealers.co.za This test is not yet approved or cleared by the [place] FDA and has been authorized for detection and/or diagnosis of SARS-CoV-2 by FDA under an Emergency Use Authorization (EUA).  This EUA will remain in effect (meaning this test can be used) for the duration of the COVID-19 declaration under Section 564(b)(1) of the Act, 21 U.S.C. section 360bbb-3(b)(1), unless the authorization is terminated or revoked sooner. Performed at [facility], [place] [address] [place], [place] [address]   Surgical pcr screen     Status: None   Collection Time: 01/18/19 10:14 PM   Specimen: Nasal Mucosa; Nasal Swab  Result Value Ref Range Status   MRSA, PCR NEGATIVE NEGATIVE Final   Staphylococcus aureus NEGATIVE NEGATIVE Final    Comment: (NOTE) The Xpert SA Assay (FDA approved for NASAL specimens in patients 52 years of age and older), is one component of a comprehensive surveillance program. It is not intended to diagnose infection nor to guide or monitor treatment. Performed at [facility], [place] [address], [place], [place] [address]       Radiology Studies: Dg Chest 1 View  Result Date: 01/18/2019 CLINICAL DATA:  Fall, left leg pain EXAM: CHEST  1 VIEW COMPARISON:  07/11/2006 FINDINGS: The heart size and mediastinal  contours are within normal limits. Both lungs are clear. The visualized skeletal structures are unremarkable. IMPRESSION: No acute cardiopulmonary findings. Electronically Signed   By: Davina Poke M.D.   On: 01/18/2019 16:39   Dg Pelvis 1-2 Views  Result Date: 01/18/2019 CLINICAL DATA:  Fall.  Left leg pain. EXAM: PELVIS - 1-2 VIEW COMPARISON:  Pelvic and right hip x-rays dated December 25, 2018. FINDINGS: Acute largely nondisplaced fracture of the left intertrochanteric femur. More subacute appearing nondisplaced fractures of the right superior and inferior pubic rami, new since the prior study. No dislocation. The hip joint spaces are relatively preserved. Osteopenia. Soft tissues are unremarkable. IMPRESSION: 1. Acute nondisplaced left intertrochanteric femur fracture. 2. More subacute appearing nondisplaced fractures of the right superior and inferior pubic rami, new since the prior study. Electronically Signed   By: Titus Dubin M.D.   On: 01/18/2019 16:42   Ct Head Wo Contrast  Result Date: 01/18/2019 CLINICAL DATA:  Fall. EXAM: CT HEAD WITHOUT CONTRAST CT CERVICAL SPINE WITHOUT CONTRAST TECHNIQUE: Multidetector CT imaging of the head and cervical spine was performed following the standard protocol without intravenous contrast. Multiplanar CT image reconstructions of the cervical spine were also generated. COMPARISON:  CT paranasal sinuses dated December 20, 2017. FINDINGS: CT HEAD FINDINGS Brain: No evidence of acute infarction, hemorrhage, extra-axial collection or mass lesion/mass effect. Mild generalized cerebral atrophy. Scattered moderate periventricular and subcortical white matter hypodensities are nonspecific, but favored to reflect chronic microvascular ischemic changes. Prominent lateral ventricles, greater than expected based on the degree of atrophy. Vascular: Calcified atherosclerosis at the skullbase. No hyperdense vessel. Skull: Normal. Negative for fracture or focal lesion.  Sinuses/Orbits: No acute finding. Other: None. CT CERVICAL SPINE FINDINGS Alignment: No traumatic malalignment. Trace stepwise anterolisthesis from C3-C4 through C7-T1. Skull base and vertebrae: No acute fracture. No primary bone lesion or focal pathologic process. Soft tissues and spinal canal: No prevertebral fluid or swelling. No visible canal hematoma. Disc levels: Mild disc height loss at C5-C6. Moderate facet arthropathy throughout the cervical spine. Upper chest: Negative. Other: None. IMPRESSION: CT head: 1.  No acute intracranial abnormality. 2. Mild atrophy and chronic microvascular ischemic changes. 3. Prominent lateral ventricles, greater than expected based on the degree of atrophy. Correlate for normal pressure hydrocephalus. CT cervical spine: 1.  No acute cervical spine fracture. Electronically Signed   By: Titus Dubin M.D.   On: 01/18/2019 17:22   Ct Cervical Spine Wo Contrast  Result Date: 01/18/2019 CLINICAL DATA:  Fall. EXAM: CT HEAD WITHOUT CONTRAST CT CERVICAL SPINE WITHOUT CONTRAST TECHNIQUE: Multidetector CT imaging of the head and cervical spine was performed following the standard protocol without intravenous contrast. Multiplanar CT image reconstructions of the cervical spine were also generated. COMPARISON:  CT paranasal sinuses dated December 20, 2017. FINDINGS: CT HEAD FINDINGS Brain: No evidence of acute infarction, hemorrhage, extra-axial collection or mass lesion/mass effect. Mild generalized cerebral atrophy. Scattered moderate periventricular and subcortical white matter hypodensities are nonspecific, but favored to reflect chronic microvascular ischemic changes. Prominent lateral ventricles, greater than expected based on the degree of atrophy. Vascular: Calcified atherosclerosis at the skullbase. No hyperdense vessel. Skull: Normal. Negative for fracture or focal lesion. Sinuses/Orbits: No acute finding. Other: None. CT CERVICAL SPINE FINDINGS Alignment: No traumatic  malalignment. Trace stepwise anterolisthesis from C3-C4 through C7-T1. Skull base and vertebrae: No acute fracture. No primary bone lesion or focal pathologic process. Soft tissues and spinal canal: No prevertebral fluid or swelling. No visible canal hematoma. Disc levels: Mild disc height loss at C5-C6. Moderate facet arthropathy throughout the cervical spine. Upper chest: Negative. Other: None. IMPRESSION: CT head: 1.  No acute intracranial abnormality. 2. Mild atrophy and chronic microvascular ischemic changes. 3. Prominent lateral ventricles, greater than expected based on the degree of atrophy. Correlate for normal pressure hydrocephalus. CT cervical spine: 1.  No acute cervical spine fracture. Electronically Signed   By: Titus Dubin M.D.   On: 01/18/2019 17:22   Dg Femur Min 2 Views Left  Result Date: 01/18/2019 CLINICAL DATA:  [place].  Left leg pain. EXAM: LEFT FEMUR 2 VIEWS COMPARISON:  None. FINDINGS: Nondisplaced intertrochanteric fracture of the left hip. No femoral shaft fracture. The knee joint is maintained. The visualized left hemipelvis is intact. IMPRESSION: Nondisplaced intertrochanteric fracture of the left hip. Electronically Signed   By: Marijo Sanes M.D.   On: 01/18/2019 16:39      Scheduled Meds: . aspirin  81 mg Oral QHS  . buPROPion  150 mg Oral Daily  . donepezil  5 mg Oral QHS  . fluticasone  1 spray Each Nare BID  . lisinopril  20 mg Oral BID  . megestrol  20 mg Oral Daily  . memantine  10 mg Oral Daily  . pantoprazole  40 mg Oral Daily  . [START ON 01/20/2019] pneumococcal 23 valent vaccine  0.5 mL Intramuscular Tomorrow-1000  . povidone-iodine  2 application Topical Once  . povidone-iodine  2 application Topical Once  . sertraline  100 mg Oral Daily  . timolol  1 drop Both Eyes Daily  . traZODone  50 mg Oral QHS   Continuous Infusions: . sodium chloride    .  ceFAZolin (ANCEF) IV    . magnesium sulfate bolus IVPB    . methocarbamol (ROBAXIN) IV    .  vancomycin       LOS: 1 day      Time spent: 35 minutes   Dessa Phi, DO [facility] 01/19/2019, 11:51 AM   Available via Epic secure chat 7am-7pm After these hours, please refer to coverage provider listed on [email or web address]

## 2019-01-19 NOTE — Transfer of Care (Signed)
Immediate Anesthesia Transfer of Care Note  Patient: Shelly Cervantes  Procedure(s) Performed: INTRAMEDULLARY (IM) NAIL FEMORAL (Left Leg Upper)  Patient Location: PACU  Anesthesia Type:General  Level of Consciousness: awake and patient cooperative  Airway & Oxygen Therapy: Patient Spontanous Breathing and Patient connected to face mask oxygen  Post-op Assessment: Report given to RN and Post -op Vital signs reviewed and stable  Post vital signs: Reviewed and stable  Last Vitals:  Vitals Value Taken Time  BP    Temp    Pulse 69 01/19/19 1623  Resp 22 01/19/19 1623  SpO2 93 % 01/19/19 1623  Vitals shown include unvalidated device data.  Last Pain:  Vitals:   01/19/19 0807  TempSrc: Oral  PainSc:       Patients Stated Pain Goal: 0 ([id number])  Complications: No apparent anesthesia complications

## 2019-01-19 NOTE — Op Note (Signed)
Date of Surgery: 01/19/2019  INDICATIONS: Shelly Cervantes is a 83 y.o.-year-old female who sustained a left hip fracture. The risks and benefits of the procedure discussed with the patient and family prior to the procedure and all questions were answered; consent was obtained.  PREOPERATIVE DIAGNOSIS: left hip fracture   POSTOPERATIVE DIAGNOSIS: Same   PROCEDURE: Treatment of intertrochanteric, pertrochanteric, subtrochanteric fracture with intramedullary implant. CPT [phone]   SURGEON: Dannielle Karvonen. Stann Mainland, M.D.   ANESTHESIA: general   IV FLUIDS AND URINE: See anesthesia record   ESTIMATED BLOOD LOSS:   100cc  IMPLANTS:   Synthes TFNA 10 x 180 mm Proximal compression screw 90 mm  DRAINS: None.   COMPLICATIONS: None.   DESCRIPTION OF PROCEDURE: The patient was brought to the operating room and placed supine on the operating table. The patient's leg had been signed prior to the procedure. The patient had the anesthesia placed by the anesthesiologist. The prep verification and incision time-outs were performed to confirm that this was the correct patient, site, side and location. The patient had an SCD on the opposite lower extremity. The patient did receive antibiotics prior to the incision and was re-dosed during the procedure as needed at indicated intervals. The patient was positioned on the fracture table with the table in traction and internal rotation to reduce the hip. The well leg was placed in a scissor position and all bony prominences were well-padded. The patient had the lower extremity prepped and draped in the standard surgical fashion. The incision was made 4 finger breadths superior to the greater trochanter. A guide pin was inserted into the tip of the greater trochanter under fluoroscopic guidance. An opening reamer was used to gain access to the femoral canal. The nail length was measured and inserted down the femoral canal to its proper depth. The appropriate version of  insertion for the lag screw was found under fluoroscopy. A pin was inserted up the femoral neck through the jig. The length of the lag screw was then measured. The lag screw was inserted as near to center-center in the head as possible. The leg was taken out of traction, then the compression screw was used to compress across the fracture. Compression was visualized on serial xrays.   We next turned our attention to the distal interlocking screw.  This was placed through the drill guide of the nail inserter.  A small incision was made overlying the lateral thigh at the screw site, and a tonsil was used to disect down to bone.  A drill pass was made through the jig and across the nail through both cortices.  This was measured, and the appropriate screw was placed under hand power and found to have good bite.    The wound was copiously irrigated with saline and the subcutaneous layer closed with 2.0 vicryl and the skin was reapproximated with staples. The wounds were cleaned and dried a final time and a sterile dressing was placed. The hip was taken through a range of motion at the end of the case under fluoroscopic imaging to visualize the approach-withdraw phenomenon and confirm implant length in the head. The patient was then awakened from anesthesia and taken to the recovery room in stable condition. All counts were correct at the end of the case.   POSTOPERATIVE PLAN: The patient will be weight bearing as tolerated and will return in 2 weeks for staple removal and the patient will receive DVT prophylaxis based on other medications, activity level, and risk  ratio of bleeding to thrombosis.  Resume daily aspirin for DVT ppx.  Change dressings as needed.  Ok to shower on post op day #3.   Geralynn Rile, MD [facility] [phone] 4:09 PM

## 2019-01-19 NOTE — Discharge Instructions (Signed)
[place] for weight bearing as tolerated to left leg - change to dry dressings once per day - ok to shower, but do not submerge under water, starting on POD 3 - for DVT prevention take your daily aspirin regimen as before surgery - follow up with Dr. Stann Mainland in 2 weeks

## 2019-01-19 NOTE — Progress Notes (Signed)
Pt removed IV unable to restart at this time- due to uncooperative state of pt- unable to get her to take oral meds either

## 2019-01-19 NOTE — Progress Notes (Signed)
Pt noted with hx of dementia. Pt with multiple attempts to exit bed, takes off continuous pulse ox often . Refuses SCD's at this time. Pt oriented to room environment. Due to memory impairment/dementia reorientation is often needed. Incont of urine, pure wick placed.  Elevated B/P at 163/107, prn labetalol given see mar for parameters. F/u B/P at 151/82.  Hibiclens bath completed and pt has remained NPO.  Bed exit alarm maintained and  Rounds completed per MD orders and unit protocol.

## 2019-01-19 NOTE — Brief Op Note (Signed)
01/19/2019  4:09 PMnone PATIENT:  Shelly Cervantes  83 y.o. female  PRE-OPERATIVE DIAGNOSIS:  Left hip fracture  POST-OPERATIVE DIAGNOSIS:  Left hip fracture  PROCEDURE:  Procedure(s): INTRAMEDULLARY (IM) NAIL FEMORAL (Left)  SURGEON:  Surgeon(s) and Role:    * Nicholes Stairs, MD - Primary  PHYSICIAN ASSISTANT:   ASSISTANTS: none   ANESTHESIA:   general  EBL:  50 mL   BLOOD ADMINISTERED:none  DRAINS: none   LOCAL MEDICATIONS USED:  NONE  SPECIMEN:  No Specimen  DISPOSITION OF SPECIMEN:  N/A  COUNTS:  YES  TOURNIQUET:  * No tourniquets in log *  DICTATION: .Note written in EPIC  PLAN OF CARE: Admit to inpatient   PATIENT DISPOSITION:  PACU - hemodynamically stable.   Delay start of Pharmacological VTE agent (>24hrs) due to surgical blood loss or risk of bleeding: not applicable

## 2019-01-19 NOTE — Anesthesia Preprocedure Evaluation (Addendum)
Anesthesia Evaluation  Patient identified by MRN, date of birth, ID band Patient awake    Reviewed: Allergy & Precautions, NPO status , Patient's Chart, lab work & pertinent test results  Airway Mallampati: II  TM Distance: >3 FB Neck ROM: Full    Dental no notable dental hx. (+) Teeth Intact, Partial Upper, Partial Lower, Dental Advisory Given   Pulmonary neg pulmonary ROS,    Pulmonary exam normal breath sounds clear to auscultation       Cardiovascular hypertension, Pt. on medications Normal cardiovascular exam Rhythm:Regular Rate:Normal     Neuro/Psych PSYCHIATRIC DISORDERS Anxiety Depression Dementia Alzheimer's disease    GI/Hepatic Neg liver ROS, GERD  ,  Endo/Other  negative endocrine ROS  Renal/GU negative Renal ROS     Musculoskeletal negative musculoskeletal ROS (+)   Abdominal   Peds  Hematology negative hematology ROS (+)   Anesthesia Other Findings High cholesterol  Reproductive/Obstetrics                             Anesthesia Physical  Anesthesia Plan  ASA: III  Anesthesia Plan: General   Post-op Pain Management:    Induction: Intravenous  PONV Risk Score and Plan: 3 and Ondansetron, Dexamethasone, Treatment may vary due to age or medical condition and Midazolam  Airway Management Planned: Oral ETT  Additional Equipment:   Intra-op Plan:   Post-operative Plan: Extubation in OR  Informed Consent: I have reviewed the patients History and Physical, chart, labs and discussed the procedure including the risks, benefits and alternatives for the proposed anesthesia with the patient or authorized representative who has indicated his/her understanding and acceptance.     Dental advisory given  Plan Discussed with: CRNA  Anesthesia Plan Comments:         Anesthesia Quick Evaluation

## 2019-01-19 NOTE — Progress Notes (Addendum)
CSW received a phone call from [place], [name]. The patient is Shelly Cervantes's mother. Shelly Cervantes informed CSW that her mother is from [facility] in [place]. She lives in independent living with her husband. Shelly Cervantes stated that her mother's husband is no longer able to care for her. Shelly Cervantes stated that her mother did have [facility] coming in and working with her and an aide coming in once a week to help bath the patient. Shelly Cervantes shared that her mother has alzheimer's. They are trying to work on getting her into the memory care before she had fallen. Patient has a history of falling.   CSW provided SNF list and home health list. PT will evaluate once she has her procedure.   CSW will continue to follow and assist with disposition planning.   Domenic Schwab, MSW, [place] Worker [facility]  [phone]

## 2019-01-19 NOTE — Progress Notes (Signed)
Initial Nutrition Assessment  DOCUMENTATION CODES:    Underweight  INTERVENTION:  Once diet advances, provide Ensure Enlive po BID, each supplement provides 350 kcal and 20 grams of protein.  NUTRITION DIAGNOSIS:   Increased nutrient needs related to post-op healing as evidenced by estimated needs.  GOAL:   Patient will meet greater than or equal to 90% of their needs  MONITOR:   Supplement acceptance, Skin, Weight trends, Labs, I & O's, Diet advancement  REASON FOR ASSESSMENT:   Consult Hip fracture protocol  ASSESSMENT:   83 y.o. female with medical history significant for dementia, breast cancer status post definitive surgery, hypertension, and anxiety, now presenting to the emergency department for evaluation of left hip pain after a fall. Radiographs of the hip and pelvis demonstrate acute nondisplaced left intertrochanteric femur fracture as well as more subacute appearing nondisplaced fractures of the right superior and inferior pubic rami.  Pt is currently NPO for surgery today. Pt reports eating fairly well PTA with usual consumption of at least 2 meals a day with an Ensure shake at least once daily. Pt does endorse weight loss over the past ~ 9 months with usual weight of ~130 lbs. Pt unable to provide reason for weight loss. Per weight records, pt with a 15% weight loss in 4 months, which is significant for time frame. RD to order Ensure to aid in caloric and protein needs. RN to provide once diet advances as appropriate.   Unable to complete Nutrition-Focused physical exam at this time. Pt reports severe pain during time of visit.   Labs and medications reviewed.   Diet Order:   Diet Order            Diet NPO time specified  Diet effective now              EDUCATION NEEDS:   Not appropriate for education at this time  Skin:  Skin Assessment: Reviewed RN Assessment  Last BM:  10/8  Height:   Ht Readings from Last 1 Encounters:  01/19/19 5\' 5"  (1.651  m)    Weight:   Wt Readings from Last 1 Encounters:  01/19/19 45.4 kg    Ideal Body Weight:  56.8 kg  BMI:  Body mass index is 16.66 kg/m.  Estimated Nutritional Needs:   Kcal:  1500-1700  Protein:  65-75 grams  Fluid:  >/= 1.5 L/day    Corrin Parker, MS, RD, LDN Pager # [phone] After hours/ weekend pager # [phone]

## 2019-01-19 NOTE — Anesthesia Procedure Notes (Addendum)
Procedure Name: Intubation Date/Time: 01/19/2019 3:08 PM Performed by: Jenne Campus, CRNA Pre-anesthesia Checklist: Patient identified, Emergency Drugs available, Suction available and Patient being monitored Patient Re-evaluated:Patient Re-evaluated prior to induction Oxygen Delivery Method: Circle System Utilized Preoxygenation: Pre-oxygenation with 100% oxygen Induction Type: IV induction Ventilation: Mask ventilation without difficulty Laryngoscope Size: Miller and 2 Grade View: Grade I Tube type: Oral Tube size: 7.0 mm Number of attempts: 1 Airway Equipment and Method: Stylet Placement Confirmation: ETT inserted through vocal cords under direct vision,  positive ETCO2 and breath sounds checked- equal and bilateral Secured at: 21 cm Tube secured with: Tape Dental Injury: Teeth and Oropharynx as per pre-operative assessment

## 2019-01-19 NOTE — H&P (Addendum)
H&P update  The surgical history has been reviewed and remains accurate without interval change.  The patient was re-examined and patient's physiologic condition has not changed significantly in the last 30 days. The condition still exists that makes this procedure necessary. The treatment plan remains the same, without new options for care.  No new pharmacological allergies or types of therapy has been initiated that would change the plan or the appropriateness of the plan.  The patient and/or family understand the potential benefits and risks.  Ms. Fritzsche is indicated for intramedullary nail treatment for her intertrochanteric fracture of the left hip.  Questions were solicited and answered from her and her husband.  They have provided informed consent to proceed.  She will return to the hospitalist service postoperatively for medical management of her stable comorbidities and for routine postoperative therapy and disposition planning.  Reade Trefz P. Stann Mainland, MD 01/19/2019 2:03 PM

## 2019-01-20 LAB — BASIC METABOLIC PANEL
Anion gap: 11 (ref 5–15)
BUN: 13 mg/dL (ref 8–23)
CO2: 23 mmol/L (ref 22–32)
Calcium: 9.1 mg/dL (ref 8.9–10.3)
Chloride: 102 mmol/L (ref 98–111)
Creatinine, Ser: 0.95 mg/dL (ref 0.44–1.00)
GFR calc Af Amer: >60 mL/min (ref >60)
GFR calc non Af Amer: 56 mL/min — ABNORMAL LOW (ref >60)
Glucose, Bld: 97 mg/dL (ref 70–99)
Potassium: 3.3 mmol/L — ABNORMAL LOW (ref 3.5–5.1)
Sodium: 136 mmol/L (ref 135–145)

## 2019-01-20 LAB — MAGNESIUM: Magnesium: 2 mg/dL (ref 1.7–2.4)

## 2019-01-20 MED ORDER — POTASSIUM CHLORIDE CRYS ER 20 MEQ PO TBCR
40.0000 meq | EXTENDED_RELEASE_TABLET | Freq: Once | ORAL | Status: AC
  Administered 2019-01-20: 40 meq via ORAL
  Filled 2019-01-20: qty 2

## 2019-01-20 MED ORDER — POTASSIUM CHLORIDE CRYS ER 20 MEQ PO TBCR
40.0000 meq | EXTENDED_RELEASE_TABLET | Freq: Once | ORAL | Status: DC
  Filled 2019-01-20: qty 2

## 2019-01-20 MED ORDER — ENOXAPARIN SODIUM 30 MG/0.3ML ~~LOC~~ SOLN
30.0000 mg | SUBCUTANEOUS | Status: DC
  Administered 2019-01-20: 30 mg via SUBCUTANEOUS
  Filled 2019-01-20: qty 0.3

## 2019-01-20 NOTE — Evaluation (Signed)
Physical Therapy Evaluation Patient Details Name: Shelly Cervantes MRN: [id number] DOB: 1935/09/20 Today's Date: 01/20/2019   History of Present Illness  Shelly Cervantes is a 83 y.o. female with medical history significant for Alzheimer's dementia, breast cancer status post definitive surgery, hypertension, and anxiety, now presenting to the emergency department for evaluation of left hip pain after a fall.  Per husband report, patient has had recent gait difficulty with frequent falls, reporting that this is the fourth fall she has had in the past few weeks.  pt had IM nailing 01-19-2019    Clinical Impression  Unable to get pt standing erect with RW - due to pt resisting moving due to left hip pain. She is confused and doesn't know she broke her hip or had surgery and resists painful movements.  Luckily she is WBAT but her dementia and strong personality is going to make her recovery harder.  Even toileting at this point is going to be hard - talked to nursing tech about this.  Pt will need SNF level PT care at this point - husband wanting to have her end up in memory care unit as he is worn out from 24/7 caring for her.  Pt also has been having palliative/hospice visiting weekly per husband.  At DC pt will need 24/7 care and +2 for all mobility.  Acute PT will continue to work with her - as she heals and has less pain -s he should do better.    Follow Up Recommendations SNF;Supervision/Assistance - 24 hour    Equipment Recommendations  None recommended by PT    Recommendations for Other Services       Precautions / Restrictions Precautions Precautions: Fall Restrictions Weight Bearing Restrictions: Yes LLE Weight Bearing: Weight bearing as tolerated      Mobility  Bed Mobility Overal bed mobility: Needs Assistance Bed Mobility: Supine to Sit     Supine to sit: Mod assist     General bed mobility comments: pt didnt want me to help her get her legs out of the bed. she was abel to  move left leg over.  I had to help her raise her trunk off the bed.  pt wouldnt scoot forward on command and didnt like me scooting her forward  Transfers Overall transfer level: Needs assistance Equipment used: Rolling walker (2 wheeled) Transfers: Sit to/from [facility] Sit to Stand: Max assist;+2 physical assistance;From elevated surface Stand pivot transfers: Max assist;+2 safety/equipment       General transfer comment: I tried to have pt stand with RW from high surface. she wouldnt keep her feet under her. she wouldnt put weight through the walker. she wouldnt try to help stand up - kept pushing back to get on the bed saying her leg hurt.  with +2 - never got her standing with RW.  I then had pt hold onto me and i stood her up and pivoted her to her right side adn lowered her into the chair.  We used chuck to scoot her back in teh chair and get her comfortable.  Ambulation/Gait             General Gait Details: unable  Stairs            Wheelchair Mobility    Modified Rankin (Stroke Patients Only)       Balance Overall balance assessment: Needs assistance   Sitting balance-Leahy Scale: Poor Sitting balance - Comments: pt didnt want to sit EOB - kept trying  to lay back saying her hip hurts Postural control: Posterior lean   Standing balance-Leahy Scale: Zero Standing balance comment: unable to get pt standing with RW with +2 assist - she kept pushing back to get back on the bed.                             Pertinent Vitals/Pain Pain Assessment: Faces Faces Pain Scale: Hurts whole lot Pain Location: left hip Pain Descriptors / Indicators: Operative site guarding;Discomfort;Guarding;Grimacing Pain Intervention(s): Limited activity within patient's tolerance;Monitored during session;Repositioned    Home Living Family/patient expects to be discharged to:: Skilled nursing facility                 Additional Comments: Pt was  living in independent living senior living apartment with her husband.  pts dementia has been advancing.  Husband said he was thinking about placing her in memory care - even before this happened as he is worn out from caring for her.  Pt also had palliative/hospice coming every week to pt.    Prior Function Level of Independence: Needs assistance   Gait / Transfers Assistance Needed: pt has rollator and RW but refuses to use either.  pt wanting to do everything for herself per husband - would fuss at him for hands on - but she wasnt safe.  she has had 4 recent falls           Hand Dominance        Extremity/Trunk Assessment        Lower Extremity Assessment Lower Extremity Assessment: LLE deficits/detail LLE Deficits / Details: not formally tested - pt able to move it on her own to get it OOB. LLE: Unable to fully assess due to pain    Cervical / Trunk Assessment Cervical / Trunk Assessment: Kyphotic  Communication   Communication: No difficulties  Cognition Arousal/Alertness: Awake/alert Behavior During Therapy: Impulsive;Anxious Overall Cognitive Status: History of cognitive impairments - at baseline                                 General Comments: pt has advanced dementia - she is fiercely independent (per husband)- doesnt want people touching her and pulling on her - seen in PT eval.  she doesnt know she is in the hospital and doesnt know she has broken her hip and had surgery - even if she has just been told.  she starts moving and then feels pain but doesnt know why.      General Comments      Exercises     Assessment/Plan    PT Assessment Patient needs continued PT services  PT Problem List Decreased strength;Decreased mobility;Decreased safety awareness;Decreased range of motion;Decreased knowledge of precautions;Decreased activity tolerance;Decreased cognition;Cardiopulmonary status limiting activity;Decreased skin integrity;Decreased  balance;Decreased knowledge of use of DME;Pain       PT Treatment Interventions DME instruction;Therapeutic activities;Gait training;Therapeutic exercise;Patient/family education;Balance training;Functional mobility training    PT Goals (Current goals can be found in the Care Plan section)  Acute Rehab PT Goals Patient Stated Goal: husband wants pt progressed to memory care unit PT Goal Formulation: With family Time For Goal Achievement: 01/27/19 Potential to Achieve Goals: Poor    Frequency Min 3X/week   Barriers to discharge   pt needs +2 24/7 assist    Co-evaluation  AM-PAC PT "6 Clicks" Mobility  Outcome Measure Help needed turning from your back to your side while in a flat bed without using bedrails?: A Lot Help needed moving from lying on your back to sitting on the side of a flat bed without using bedrails?: A Lot Help needed moving to and from a bed to a chair (including a wheelchair)?: Total Help needed standing up from a chair using your arms (e.g., wheelchair or bedside chair)?: Total Help needed to walk in hospital room?: Total Help needed climbing 3-5 steps with a railing? : Total 6 Click Score: 8    End of Session Equipment Utilized During Treatment: Gait belt Activity Tolerance: Patient limited by pain;Treatment limited secondary to agitation Patient left: in chair;with chair alarm set;with family/visitor present;with call bell/phone within reach Nurse Communication: Mobility status;Precautions;Weight bearing status PT Visit Diagnosis: Other abnormalities of gait and mobility (R26.89);Repeated falls (R29.6);History of falling (Z91.81);Difficulty in walking, not elsewhere classified (R26.2);Pain Pain - Right/Left: Left Pain - part of body: Hip    Time: 1305-1340 PT Time Calculation (min) (ACUTE ONLY): 35 min   Charges:   PT Evaluation $PT Eval Moderate Complexity: 1 Mod PT Treatments $Therapeutic Activity: 8-22 mins         01/20/2019   Rande Lawman, PT   Loyal Buba 01/20/2019, 2:21 PM

## 2019-01-20 NOTE — Progress Notes (Signed)
   Subjective: 1 Day Post-Op Procedure(s) (LRB): INTRAMEDULLARY (IM) NAIL FEMORAL (Left)  Pt c/o mild soreness in left hip today s/p IM nail Asking her her husband Denies any new symptoms or issues Patient reports pain as mild.  Objective:   VITALS:   Vitals:   01/19/19 2108 01/20/19 0024  BP: (!) 176/88 (!) 164/73  Pulse: 91 92  Resp: 20 18  Temp:  97.9 F (36.6 C)  SpO2: 93% 94%    Well healing incision to lateral left hip nv intact distally No rashes or edema  Mild soreness with rom  LABS Recent Labs    01/18/19 1645 01/19/19 0236  HGB 12.2 11.2*  HCT 36.4 32.0*  WBC 9.5 8.3  PLT 204 182    Recent Labs    01/18/19 1645 01/19/19 0236 01/20/19 0459  NA 138 138 136  K 2.5* 3.2* 3.3*  BUN 11 10 13   CREATININE 0.88 0.96 0.95  GLUCOSE 126* 122* 97     Assessment/Plan: 1 Day Post-Op Procedure(s) (LRB): INTRAMEDULLARY (IM) NAIL FEMORAL (Left) PT/OT Pain management D/c planning Pulmonary toilet    Brad Luna [place], MPAS [facility] is now [employer]  [place] [address], [place], [place], [place] [address] Phone: [phone] [email or web address] Facebook  [employer]

## 2019-01-20 NOTE — [place] FL2 (Signed)
[place] MEDICAID FL2 LEVEL OF CARE SCREENING TOOL     IDENTIFICATION  Patient Name: Shelly Cervantes Birthdate: Nov 11, 1935 Sex: female Admission Date (Current Location): 01/18/2019  [facility] and [place] Number:  Herbalist and Address:  [facility] [place], [place] [address], [place], [place] [address]      Provider Number: [id number]  Attending Physician Name and Address:  Dessa Phi, DO  Relative Name and Phone Number:  Karna Christmas (daughter) [phone]    Current Level of Care: Hospital Recommended Level of Care: [place]  Prior Approval Number:    Date Approved/Denied:   PASRR Number: [id number]  Discharge Plan: SNF    Current Diagnoses: Patient Active Problem List   Diagnosis Date Noted  . Closed fracture of left hip ([place]) 01/18/2019  . Hypokalemia 01/18/2019  . Hypertensive urgency 01/18/2019  . Prolonged QT interval 01/18/2019  . Fall   . Late onset Alzheimer's disease without behavioral disturbance ([place]) 12/07/2018  . Osteopenia 12/08/2016  . Malignant neoplasm of upper-outer quadrant of left breast in female, estrogen receptor positive ([place]) 10/14/2016    Orientation RESPIRATION BLADDER Height & Weight     Self, Place  Normal Incontinent, External catheter Weight: 100 lb 1.4 oz (45.4 kg) Height:  5\' 5"  (165.1 cm)  BEHAVIORAL SYMPTOMS/MOOD NEUROLOGICAL BOWEL NUTRITION STATUS      Continent Diet(see discharge summary)  AMBULATORY STATUS COMMUNICATION OF NEEDS Skin   Extensive Assist Verbally Other (Comment), Surgical wounds(left hip closed surgical incision)                       Personal Care Assistance Level of Assistance  Bathing, Feeding, Dressing, Total care Bathing Assistance: Maximum assistance Feeding assistance: Limited assistance Dressing Assistance: Maximum assistance Total Care Assistance: Maximum assistance   Functional Limitations Info  Sight, Hearing, Speech Sight Info: Adequate Hearing  Info: Adequate Speech Info: Adequate    SPECIAL CARE FACTORS FREQUENCY  PT (By licensed PT), OT (By licensed OT)     PT Frequency: min 5x weekly OT Frequency: min 5x weekly            Contractures Contractures Info: Not present    Additional Factors Info  Code Status, Allergies Code Status Info: full Allergies Info: Codeine, Penicillins           Current Medications (01/20/2019):  This is the current hospital active medication list Current Facility-Administered Medications  Medication Dose Route Frequency Provider Last Rate Last Dose  . acetaminophen (TYLENOL) tablet 500 mg  500 mg Oral Q6H Nicholes Stairs, MD   500 mg at 01/20/19 1112  . aspirin chewable tablet 81 mg  81 mg Oral QHS Nicholes Stairs, MD   81 mg at 01/19/19 2151  . buPROPion (WELLBUTRIN XL) 24 hr tablet 150 mg  150 mg Oral Daily Nicholes Stairs, MD   150 mg at 01/20/19 1008  . docusate sodium (COLACE) capsule 100 mg  100 mg Oral BID Nicholes Stairs, MD   100 mg at 01/20/19 1010  . donepezil (ARICEPT) tablet 5 mg  5 mg Oral QHS Nicholes Stairs, MD      . enoxaparin (LOVENOX) injection 30 mg  30 mg Subcutaneous Q24H Dessa Phi, DO   30 mg at 01/20/19 1112  . feeding supplement (ENSURE ENLIVE) (ENSURE ENLIVE) liquid 237 mL  237 mL Oral BID BM Nicholes Stairs, MD   237 mL at 01/20/19 1451  . fentaNYL (SUBLIMAZE) injection 12.5-25 mcg  12.5-25 mcg Intravenous Q2H PRN Nicholes Stairs, MD   25 mcg at 01/19/19 [phone]  . fluticasone (FLONASE) 50 MCG/ACT nasal spray 1 spray  1 spray Each Nare BID Nicholes Stairs, MD   1 spray at 01/20/19 1012  . hydrALAZINE (APRESOLINE) injection 5 mg  5 mg Intravenous Q4H PRN Nicholes Stairs, MD      . hydrOXYzine (ATARAX/VISTARIL) tablet 25 mg  25 mg Oral Q8H PRN Nicholes Stairs, MD      . labetalol (NORMODYNE) injection 10 mg  10 mg Intravenous Q2H PRN Nicholes Stairs, MD   10 mg at 01/19/19 0041  . lactated ringers  infusion   Intravenous Continuous Nicholes Stairs, MD 10 mL/hr at 01/19/19 1416    . lisinopril (ZESTRIL) tablet 20 mg  20 mg Oral BID Nicholes Stairs, MD   20 mg at 01/20/19 1009  . megestrol (MEGACE) tablet 20 mg  20 mg Oral Daily Nicholes Stairs, MD   20 mg at 01/20/19 1010  . memantine (NAMENDA) tablet 10 mg  10 mg Oral Daily Nicholes Stairs, MD   10 mg at 01/20/19 1011  . methocarbamol (ROBAXIN) tablet 500 mg  500 mg Oral Q6H PRN Nicholes Stairs, MD       Or  . methocarbamol (ROBAXIN) 500 mg in dextrose 5 % 50 mL IVPB  500 mg Intravenous Q6H PRN Nicholes Stairs, MD      . pantoprazole (PROTONIX) EC tablet 40 mg  40 mg Oral Daily Nicholes Stairs, MD   40 mg at 01/20/19 1010  . senna-docusate (Senokot-S) tablet 1 tablet  1 tablet Oral QHS PRN Nicholes Stairs, MD      . sertraline (ZOLOFT) tablet 100 mg  100 mg Oral Daily Nicholes Stairs, MD   100 mg at 01/20/19 1009  . timolol (TIMOPTIC) 0.5 % ophthalmic solution 1 drop  1 drop Both Eyes Daily Nicholes Stairs, MD   1 drop at 01/20/19 1011  . traZODone (DESYREL) tablet 50 mg  50 mg Oral QHS Nicholes Stairs, MD         Discharge Medications: Please see discharge summary for a list of discharge medications.  Relevant Imaging Results:  Relevant Lab Results:   Additional Information SSN: [id number]  Alberteen Sam, LCSW

## 2019-01-20 NOTE — Progress Notes (Signed)
Notified MRI pt would not be compliant with lying still for exam

## 2019-01-20 NOTE — TOC Initial Note (Signed)
Transition of Care [facility]) - Initial/Assessment Note    Patient Details  Name: Shelly Cervantes MRN: [id number] Date of Birth: 04-05-1936  [facility] of [facility]) CM/SW Contact:    Alberteen Sam,  Phone Number: [phone] 01/20/2019, 3:05 PM  Clinical Narrative:                  CSW notes patient not fully oriented with dementia, reached otu to [name] patient's daughter regarding discharge planning. Terri acknowledges PT recommendation of [place] for short term rehab and is in agreement of this. She reports patient lives at [facility], she reports no preference for SNF however would like referrals to be sent to [facility], [facility], and [place]. CSW explained insurance authorization process and will send referrals. Terri to be informed of bed offers once available.   Expected Discharge Plan: Skilled Nursing Facility Barriers to Discharge: Continued Medical Work up   Patient Goals and CMS Choice   CMS Medicare.gov Compare Post Acute Care list provided to:: Patient Represenative (must comment)(daughter Terri) Choice offered to / list presented to : Adult Children  Expected Discharge Plan and Services Expected Discharge Plan: [place] Acute Care Choice: [place] Living arrangements for the past 2 months: Single Family Home                                      Prior Living Arrangements/Services Living arrangements for the past 2 months: Single Family Home Lives with:: Spouse Patient language and need for interpreter reviewed:: Yes Do you feel safe going back to the place where you live?: No   needs short term rehab  Need for Family Participation in Patient Care: Yes (Comment) Care giver support system in place?: Yes (comment)   Criminal Activity/Legal Involvement Pertinent to Current Situation/Hospitalization: No - Comment as needed  Activities of Daily Living Home Assistive  Devices/Equipment: None ADL Screening (condition at time of admission) Patient's cognitive ability adequate to safely complete daily activities?: No Is the patient deaf or have difficulty hearing?: No Does the patient have difficulty seeing, even when wearing glasses/contacts?: No Does the patient have difficulty concentrating, remembering, or making decisions?: Yes Patient able to express need for assistance with ADLs?: Yes Does the patient have difficulty dressing or bathing?: No Independently performs ADLs?: Yes (appropriate for developmental age) Does the patient have difficulty walking or climbing stairs?: No Weakness of Legs: Left Weakness of Arms/Hands: None  Permission Sought/Granted Permission sought to share information with : Case Manager, Customer service manager, Family Supports Permission granted to share information with : Yes, Verbal Permission Granted  Share Information with NAME: Terri  Permission granted to share info w AGENCY: SNFs  Permission granted to share info w Relationship: daughter  Permission granted to share info w Contact Information: [phone]  Emotional Assessment Appearance:: Appears stated age Attitude/Demeanor/Rapport: Gracious Affect (typically observed): Calm Orientation: : Oriented to Self, Oriented to Place, Oriented to  Time, Oriented to Situation Alcohol / Substance Use: Not Applicable Psych Involvement: No (comment)  Admission diagnosis:  Hypokalemia [E87.6] Hip fracture ([place]) [S72.009A] Pain [R52] Closed fracture of left hip, initial encounter ([place]) [S72.002A] Fall, initial encounter [W19.XXXA] Patient Active Problem List   Diagnosis Date Noted  . Closed fracture of left hip ([place]) 01/18/2019  . Hypokalemia 01/18/2019  . Hypertensive urgency 01/18/2019  . Prolonged QT interval 01/18/2019  .  Fall   . Late onset Alzheimer's disease without behavioral disturbance ([place]) 12/07/2018  . Osteopenia 12/08/2016  . Malignant  neoplasm of upper-outer quadrant of left breast in female, estrogen receptor positive ([place]) 10/14/2016   PCP:  [name], MD Pharmacy:   [id number] [place], [place] [place], [address] [facility] [address] [place], [address] [place] [address] Phone: [phone] Fax: [phone]     Social Determinants of Health (SDOH) Interventions    Readmission Risk Interventions No flowsheet data found.

## 2019-01-20 NOTE — Progress Notes (Signed)
PROGRESS NOTE    Shelly Cervantes  [id number] DOB: [birth year] DOA: 01/18/2019 PCP: [name], MD     Brief Narrative:  Shelly Cervantes is a 83 y.o. female with medical history significant for Alzheimer's dementia, breast cancer status post definitive surgery, hypertension, and anxiety, now presenting to the emergency department for evaluation of left hip pain after a fall.  Per husband report, patient has had recent gait difficulty with frequent falls, reporting that this is the fourth fall she has had in the past few weeks.  She had not been complaining of anything leading up to this and seemed to be having an uneventful day.  Patient denies losing consciousness or experiencing any chest pain or lightheadedness prior to the fall.  Her husband heard her yell from another room, found her on the floor complaining of severe left hip pain, and called EMS. Radiographs of the hip and pelvis demonstrate acute nondisplaced left intertrochanteric femur fracture as well as more subacute appearing nondisplaced fractures of the right superior and inferior pubic rami. Orthopedic surgery was consulted.  She underwent IM fixation of the left hip on 10/9 with Dr. Stann Mainland.  New events last 24 hours / Subjective: Remains confused, appears comfortable otherwise.  Unaware that she underwent surgery yesterday.  Denies any pain, chest pain, shortness of breath, nausea or vomiting.  Asking for breakfast.  Assessment & Plan:   Principal Problem:   Closed fracture of left hip (HCC) Active Problems:   Malignant neoplasm of upper-outer quadrant of left breast in female, estrogen receptor positive ([place])   Late onset Alzheimer's disease without behavioral disturbance (HCC)   Hypokalemia   Hypertensive urgency   Prolonged QT interval   Left hip fracture after fall -Orthopedic surgery consulted -Status post IM fixation 10/9 with Dr. Stann Mainland -Pain control -PT OT evaluation pending  Prolonged QT interval -EKG  reviewed independently today which revealed normal sinus rhythm with QTC 482, which is an improvement from the EKG in the emergency department which revealed QTC 524  Hypokalemia -Replace, trend  Hypomagnesemia -Replaced.  Magnesium this morning normal.  Essential hypertension -Continue lisinopril  Alzheimer's dementia -Continue Namenda, Aricept  Frequent falls -CT head revealed ventriculomegaly.  Concern for NPH.  Review discussed over the phone with neurology, who reviewed imaging.  He recommends outpatient referral to neurosurgery for continued work-up, patient will need a lumbar puncture for definitive diagnosis.  Depression, anxiety -Continue Wellbutrin, Atarax as needed, Zoloft, trazodone nightly   DVT prophylaxis: Lovenox Code Status: Full code Family Communication: None Disposition Plan: PT OT evaluation pending   Consultants:   Orthopedic surgery  Procedures:   None  Antimicrobials:  Anti-infectives (From admission, onward)   Start     Dose/Rate Route Frequency Ordered Stop   01/19/19 1200  ceFAZolin (ANCEF) IVPB 2g/100 mL premix  Status:  Discontinued     2 g 200 mL/hr over 30 Minutes Intravenous To Short Stay 01/19/19 0149 01/19/19 1717   01/19/19 0600  vancomycin (VANCOCIN) IVPB 1000 mg/200 mL premix     1,000 mg 200 mL/hr over 60 Minutes Intravenous On call to O.R. 01/18/19 2035 01/19/19 1516       Objective: Vitals:   01/19/19 1731 01/19/19 2108 01/20/19 0024 01/20/19 0746  BP: (!) 156/97 (!) 176/88 (!) 164/73 (!) 195/85  Pulse: 76 91 92 89  Resp:  20 18 16   Temp:   97.9 F (36.6 C) 98.7 F (37.1 C)  TempSrc:   Oral Oral  SpO2: 100% 93% 94%  93%  Weight:      Height:        Intake/Output Summary (Last 24 hours) at 01/20/2019 1000 Last data filed at 01/19/2019 1613 Gross per 24 hour  Intake 600 ml  Output 50 ml  Net 550 ml   Filed Weights   01/19/19 0135 01/19/19 1414  Weight: 45.4 kg 45.4 kg    Examination: General exam: Appears  calm and comfortable, pleasantly confused without acute distress Respiratory system: Clear to auscultation. Respiratory effort normal. Cardiovascular system: S1 & S2 heard, RRR. No JVD, murmurs, rubs, gallops or clicks. No pedal edema. Gastrointestinal system: Abdomen is nondistended, soft and nontender. No organomegaly or masses felt. Normal bowel sounds heard. Central nervous system: Alert without any focal deficits Extremities: Symmetric in appearance Skin: No rashes, lesions or ulcers on exposed skin Psychiatry: Confused, history of dementia  Data Reviewed: I have personally reviewed following labs and imaging studies  CBC: Recent Labs  Lab 01/18/19 1645 01/19/19 0236  WBC 9.5 8.3  NEUTROABS 8.0* 6.9  HGB 12.2 11.2*  HCT 36.4 32.0*  MCV 89.0 87.0  PLT 204 [id number]   Basic Metabolic Panel: Recent Labs  Lab 01/18/19 1645 01/19/19 0236 01/19/19 1208 01/20/19 0459  NA 138 138  --  136  K 2.5* 3.2*  --  3.3*  CL 100 101  --  102  CO2 25 23  --  23  GLUCOSE 126* 122*  --  97  BUN 11 10  --  13  CREATININE 0.88 0.96  --  0.95  CALCIUM 9.0 9.3  --  9.1  MG  --   --  1.6* 2.0   GFR: Estimated Creatinine Clearance: 33.3 mL/min (by C-G formula based on SCr of 0.95 mg/dL). Liver Function Tests: Recent Labs  Lab 01/18/19 1645 01/19/19 0236  AST 19 24  ALT 13 16  ALKPHOS 124 117  BILITOT 0.8 1.2  PROT 6.7 6.6  ALBUMIN 3.6 3.5   No results for input(s): LIPASE, AMYLASE in the last 168 hours. No results for input(s): AMMONIA in the last 168 hours. Coagulation Profile: Recent Labs  Lab 01/18/19 1645 01/19/19 0236  INR 1.0 1.1   Cardiac Enzymes: No results for input(s): CKTOTAL, CKMB, CKMBINDEX, TROPONINI in the last 168 hours. BNP (last 3 results) No results for input(s): PROBNP in the last 8760 hours. HbA1C: No results for input(s): HGBA1C in the last 72 hours. CBG: No results for input(s): GLUCAP in the last 168 hours. Lipid Profile: No results for input(s):  CHOL, HDL, LDLCALC, TRIG, CHOLHDL, LDLDIRECT in the last 72 hours. Thyroid Function Tests: No results for input(s): TSH, T4TOTAL, FREET4, T3FREE, THYROIDAB in the last 72 hours. Anemia Panel: No results for input(s): VITAMINB12, FOLATE, FERRITIN, TIBC, IRON, RETICCTPCT in the last 72 hours. Sepsis Labs: No results for input(s): PROCALCITON, LATICACIDVEN in the last 168 hours.  Recent Results (from the past 240 hour(s))  SARS Coronavirus 2 by RT PCR (hospital order, performed in [facility] hospital lab) Nasopharyngeal Nasopharyngeal Swab     Status: None   Collection Time: 01/18/19  7:17 PM   Specimen: Nasopharyngeal Swab  Result Value Ref Range Status   SARS Coronavirus 2 NEGATIVE NEGATIVE Final    Comment: (NOTE) If result is NEGATIVE SARS-CoV-2 target nucleic acids are NOT DETECTED. The SARS-CoV-2 RNA is generally detectable in upper and lower  respiratory specimens during the acute phase of infection. The lowest  concentration of SARS-CoV-2 viral copies this assay can detect is 250  copies /  mL. A negative result does not preclude SARS-CoV-2 infection  and should not be used as the sole basis for treatment or other  patient management decisions.  A negative result may occur with  improper specimen collection / handling, submission of specimen other  than nasopharyngeal swab, presence of viral mutation(s) within the  areas targeted by this assay, and inadequate number of viral copies  (<250 copies / mL). A negative result must be combined with clinical  observations, patient history, and epidemiological information. If result is POSITIVE SARS-CoV-2 target nucleic acids are DETECTED. The SARS-CoV-2 RNA is generally detectable in upper and lower  respiratory specimens dur ing the acute phase of infection.  Positive  results are indicative of active infection with SARS-CoV-2.  Clinical  correlation with patient history and other diagnostic information is  necessary to determine  patient infection status.  Positive results do  not rule out bacterial infection or co-infection with other viruses. If result is PRESUMPTIVE POSTIVE SARS-CoV-2 nucleic acids MAY BE PRESENT.   A presumptive positive result was obtained on the submitted specimen  and confirmed on repeat testing.  While 2019 novel coronavirus  (SARS-CoV-2) nucleic acids may be present in the submitted sample  additional confirmatory testing may be necessary for epidemiological  and / or clinical management purposes  to differentiate between  SARS-CoV-2 and other Sarbecovirus currently known to infect humans.  If clinically indicated additional testing with an alternate test  methodology [phone]) is advised. The SARS-CoV-2 RNA is generally  detectable in upper and lower respiratory sp ecimens during the acute  phase of infection. The expected result is Negative. Fact Sheet for Patients:  StrictlyIdeas.no Fact Sheet for Healthcare Providers: BankingDealers.co.za This test is not yet approved or cleared by the [place] FDA and has been authorized for detection and/or diagnosis of SARS-CoV-2 by FDA under an Emergency Use Authorization (EUA).  This EUA will remain in effect (meaning this test can be used) for the duration of the COVID-19 declaration under Section 564(b)(1) of the Act, 21 U.S.C. section 360bbb-3(b)(1), unless the authorization is terminated or revoked sooner. Performed at [facility], [place] [address], [place], [place] [address]   Surgical pcr screen     Status: None   Collection Time: 01/18/19 10:14 PM   Specimen: Nasal Mucosa; Nasal Swab  Result Value Ref Range Status   MRSA, PCR NEGATIVE NEGATIVE Final   Staphylococcus aureus NEGATIVE NEGATIVE Final    Comment: (NOTE) The Xpert SA Assay (FDA approved for NASAL specimens in patients 76 years of age and older), is one component of a comprehensive surveillance program. It is not  intended to diagnose infection nor to guide or monitor treatment. Performed at [facility], [place] [address], [place], [place] [address]       Radiology Studies: Dg Chest 1 View  Result Date: 01/18/2019 CLINICAL DATA:  Fall, left leg pain EXAM: CHEST  1 VIEW COMPARISON:  07/11/2006 FINDINGS: The heart size and mediastinal contours are within normal limits. Both lungs are clear. The visualized skeletal structures are unremarkable. IMPRESSION: No acute cardiopulmonary findings. Electronically Signed   By: Davina Poke M.D.   On: 01/18/2019 16:39   Dg Pelvis 1-2 Views  Result Date: 01/18/2019 CLINICAL DATA:  Fall.  Left leg pain. EXAM: PELVIS - 1-2 VIEW COMPARISON:  Pelvic and right hip x-rays dated December 25, 2018. FINDINGS: Acute largely nondisplaced fracture of the left intertrochanteric femur. More subacute appearing nondisplaced fractures of the right superior and inferior  pubic rami, new since the prior study. No dislocation. The hip joint spaces are relatively preserved. Osteopenia. Soft tissues are unremarkable. IMPRESSION: 1. Acute nondisplaced left intertrochanteric femur fracture. 2. More subacute appearing nondisplaced fractures of the right superior and inferior pubic rami, new since the prior study. Electronically Signed   By: Titus Dubin M.D.   On: 01/18/2019 16:42   Ct Head Wo Contrast  Result Date: 01/18/2019 CLINICAL DATA:  Fall. EXAM: CT HEAD WITHOUT CONTRAST CT CERVICAL SPINE WITHOUT CONTRAST TECHNIQUE: Multidetector CT imaging of the head and cervical spine was performed following the standard protocol without intravenous contrast. Multiplanar CT image reconstructions of the cervical spine were also generated. COMPARISON:  CT paranasal sinuses dated December 20, 2017. FINDINGS: CT HEAD FINDINGS Brain: No evidence of acute infarction, hemorrhage, extra-axial collection or mass lesion/mass effect. Mild generalized cerebral atrophy. Scattered moderate  periventricular and subcortical white matter hypodensities are nonspecific, but favored to reflect chronic microvascular ischemic changes. Prominent lateral ventricles, greater than expected based on the degree of atrophy. Vascular: Calcified atherosclerosis at the skullbase. No hyperdense vessel. Skull: Normal. Negative for fracture or focal lesion. Sinuses/Orbits: No acute finding. Other: None. CT CERVICAL SPINE FINDINGS Alignment: No traumatic malalignment. Trace stepwise anterolisthesis from C3-C4 through C7-T1. Skull base and vertebrae: No acute fracture. No primary bone lesion or focal pathologic process. Soft tissues and spinal canal: No prevertebral fluid or swelling. No visible canal hematoma. Disc levels: Mild disc height loss at C5-C6. Moderate facet arthropathy throughout the cervical spine. Upper chest: Negative. Other: None. IMPRESSION: CT head: 1.  No acute intracranial abnormality. 2. Mild atrophy and chronic microvascular ischemic changes. 3. Prominent lateral ventricles, greater than expected based on the degree of atrophy. Correlate for normal pressure hydrocephalus. CT cervical spine: 1.  No acute cervical spine fracture. Electronically Signed   By: Titus Dubin M.D.   On: 01/18/2019 17:22   Ct Cervical Spine Wo Contrast  Result Date: 01/18/2019 CLINICAL DATA:  Fall. EXAM: CT HEAD WITHOUT CONTRAST CT CERVICAL SPINE WITHOUT CONTRAST TECHNIQUE: Multidetector CT imaging of the head and cervical spine was performed following the standard protocol without intravenous contrast. Multiplanar CT image reconstructions of the cervical spine were also generated. COMPARISON:  CT paranasal sinuses dated December 20, 2017. FINDINGS: CT HEAD FINDINGS Brain: No evidence of acute infarction, hemorrhage, extra-axial collection or mass lesion/mass effect. Mild generalized cerebral atrophy. Scattered moderate periventricular and subcortical white matter hypodensities are nonspecific, but favored to reflect  chronic microvascular ischemic changes. Prominent lateral ventricles, greater than expected based on the degree of atrophy. Vascular: Calcified atherosclerosis at the skullbase. No hyperdense vessel. Skull: Normal. Negative for fracture or focal lesion. Sinuses/Orbits: No acute finding. Other: None. CT CERVICAL SPINE FINDINGS Alignment: No traumatic malalignment. Trace stepwise anterolisthesis from C3-C4 through C7-T1. Skull base and vertebrae: No acute fracture. No primary bone lesion or focal pathologic process. Soft tissues and spinal canal: No prevertebral fluid or swelling. No visible canal hematoma. Disc levels: Mild disc height loss at C5-C6. Moderate facet arthropathy throughout the cervical spine. Upper chest: Negative. Other: None. IMPRESSION: CT head: 1.  No acute intracranial abnormality. 2. Mild atrophy and chronic microvascular ischemic changes. 3. Prominent lateral ventricles, greater than expected based on the degree of atrophy. Correlate for normal pressure hydrocephalus. CT cervical spine: 1.  No acute cervical spine fracture. Electronically Signed   By: Titus Dubin M.D.   On: 01/18/2019 17:22   Dg C-arm 1-60 Min  Result Date: 01/19/2019 CLINICAL DATA:  The patient  suffered a left intertrochanteric fracture in a fall 01/18/2019. Initial encounter. EXAM: LEFT FEMUR 2 VIEWS; DG C-ARM 1-60 MIN COMPARISON:  Plain film of the pelvis 01/18/2019. FINDINGS: Three fluoroscopic spot views of the right hip demonstrate a screw in the femoral neck and short intramedullary nail with a single distal screw for fixation of an intertrochanteric fracture. Position and alignment are near anatomic. No acute abnormality. IMPRESSION: Intraoperative imaging for fixation of an intertrochanteric fracture. Electronically Signed   By: Inge Rise M.D.   On: 01/19/2019 17:52   Dg Femur Min 2 Views Left  Result Date: 01/19/2019 CLINICAL DATA:  The patient suffered a left intertrochanteric fracture in a fall  01/18/2019. Initial encounter. EXAM: LEFT FEMUR 2 VIEWS; DG C-ARM 1-60 MIN COMPARISON:  Plain film of the pelvis 01/18/2019. FINDINGS: Three fluoroscopic spot views of the right hip demonstrate a screw in the femoral neck and short intramedullary nail with a single distal screw for fixation of an intertrochanteric fracture. Position and alignment are near anatomic. No acute abnormality. IMPRESSION: Intraoperative imaging for fixation of an intertrochanteric fracture. Electronically Signed   By: Inge Rise M.D.   On: 01/19/2019 17:52   Dg Femur Min 2 Views Left  Result Date: 01/18/2019 CLINICAL DATA:  [place].  Left leg pain. EXAM: LEFT FEMUR 2 VIEWS COMPARISON:  None. FINDINGS: Nondisplaced intertrochanteric fracture of the left hip. No femoral shaft fracture. The knee joint is maintained. The visualized left hemipelvis is intact. IMPRESSION: Nondisplaced intertrochanteric fracture of the left hip. Electronically Signed   By: Marijo Sanes M.D.   On: 01/18/2019 16:39      Scheduled Meds: . acetaminophen  500 mg Oral Q6H  . aspirin  81 mg Oral QHS  . buPROPion  150 mg Oral Daily  . docusate sodium  100 mg Oral BID  . donepezil  5 mg Oral QHS  . feeding supplement (ENSURE ENLIVE)  237 mL Oral BID BM  . fluticasone  1 spray Each Nare BID  . lisinopril  20 mg Oral BID  . megestrol  20 mg Oral Daily  . memantine  10 mg Oral Daily  . pantoprazole  40 mg Oral Daily  . pneumococcal 23 valent vaccine  0.5 mL Intramuscular Tomorrow-1000  . potassium chloride  40 mEq Oral Once  . sertraline  100 mg Oral Daily  . timolol  1 drop Both Eyes Daily  . traZODone  50 mg Oral QHS   Continuous Infusions: . lactated ringers 10 mL/hr at 01/19/19 1416  . methocarbamol (ROBAXIN) IV       LOS: 2 days      Time spent: 30 minutes   Dessa Phi, DO [facility] 01/20/2019, 10:00 AM   Available via Epic secure chat 7am-7pm After these hours, please refer to coverage provider listed on  [email or web address]

## 2019-01-20 NOTE — Progress Notes (Signed)
Spoke to RN about getting pt. for MRI, RN states the pt is trying to climb out of bed with a broken hip. Explained to RN we can not complete the exam unless pt can lay flat and not move, he agreed and will call us if there are any changes.

## 2019-01-20 NOTE — Plan of Care (Signed)
  Problem: Education: Goal: Knowledge of General Education information will improve Description Including pain rating scale, medication(s)/side effects and non-pharmacologic comfort measures Outcome: Progressing   

## 2019-01-20 NOTE — Progress Notes (Signed)
[facility] for enoxaparin Indication: VTE prophylaxis   Allergies  Allergen Reactions  . Codeine Rash  . Penicillins Rash    Did it involve swelling of the face/tongue/throat, SOB, or low BP? No Did it involve sudden or severe rash/hives, skin peeling, or any reaction on the inside of your mouth or nose? No Did you need to seek medical attention at a hospital or doctor's office? No When did it last happen?Many years ago. If all above answers are "NO", may proceed with cephalosporin use.     Patient Measurements: Height: 5\' 5"  (165.1 cm) Weight: 100 lb 1.4 oz (45.4 kg) IBW/kg (Calculated) : 57  HEPARIN DW (KG): 45.4   Vital Signs: Temp: 98.7 F (37.1 C) (10/10 0746) Temp Source: Oral (10/10 0746) BP: 195/85 (10/10 0746) Pulse Rate: 89 (10/10 0746)  Labs: Recent Labs    01/18/19 1645 01/19/19 0236 01/20/19 0459  HGB 12.2 11.2*  --   HCT 36.4 32.0*  --   PLT 204 182  --   APTT  --  27  --   LABPROT 13.2 14.2  --   INR 1.0 1.1  --   CREATININE 0.88 0.96 0.95    Estimated Creatinine Clearance: 33.3 mL/min (by C-G formula based on SCr of 0.95 mg/dL).   Medical History: Past Medical History:  Diagnosis Date  . Alzheimer's disease ([place])   . Anxiety   . Breast cancer ([place])    left  . Depression   . GERD (gastroesophageal reflux disease)    occ  . High cholesterol   . Hypertension    Assessment: 83 year old female with recent fall admitted for surgical repair of hip/femur fracture. Pharmacy has been consulted to dose enoxaparin for VTE prophylaxis.  No active bleeding noted, and patient's CBC trending down but appropriate for <24h s/p surgery. No anticoagulants taken prior to admission. Patient's total body weight is ~45kg and current CrCl 79ml/min.  Goal of Therapy:  Monitor platelets by anticoagulation protocol: Yes   Plan:  Lovenox 30mg  subcutaneously q24h Monitor for s/sx bleeding  Brendolyn Patty, PharmD PGY2  Pharmacy Resident Phone [phone]  01/20/2019   10:16 AM

## 2019-01-21 ENCOUNTER — Inpatient Hospital Stay (HOSPITAL_COMMUNITY): Payer: Medicare HMO

## 2019-01-21 DIAGNOSIS — I6389 Other cerebral infarction: Secondary | ICD-10-CM

## 2019-01-21 DIAGNOSIS — I639 Cerebral infarction, unspecified: Secondary | ICD-10-CM

## 2019-01-21 LAB — CBC
HCT: 28.5 % — ABNORMAL LOW (ref 36.0–46.0)
Hemoglobin: 9.7 g/dL — ABNORMAL LOW (ref 12.0–15.0)
MCH: 30.3 pg (ref 26.0–34.0)
MCHC: 34 g/dL (ref 30.0–36.0)
MCV: 89.1 fL (ref 80.0–100.0)
Platelets: 193 10*3/uL (ref 150–400)
RBC: 3.2 MIL/uL — ABNORMAL LOW (ref 3.87–5.11)
RDW: 13.4 % (ref 11.5–15.5)
WBC: 9.1 10*3/uL (ref 4.0–10.5)
nRBC: 0 % (ref 0.0–0.2)

## 2019-01-21 LAB — ECHOCARDIOGRAM COMPLETE
Height: 65 in
Weight: 1601.42 oz

## 2019-01-21 LAB — BASIC METABOLIC PANEL
Anion gap: 12 (ref 5–15)
BUN: 21 mg/dL (ref 8–23)
CO2: 21 mmol/L — ABNORMAL LOW (ref 22–32)
Calcium: 8.9 mg/dL (ref 8.9–10.3)
Chloride: 103 mmol/L (ref 98–111)
Creatinine, Ser: 0.82 mg/dL (ref 0.44–1.00)
GFR calc Af Amer: >60 mL/min (ref >60)
GFR calc non Af Amer: >60 mL/min (ref >60)
Glucose, Bld: 84 mg/dL (ref 70–99)
Potassium: 3.5 mmol/L (ref 3.5–5.1)
Sodium: 136 mmol/L (ref 135–145)

## 2019-01-21 LAB — GLUCOSE, CAPILLARY: Glucose-Capillary: 82 mg/dL (ref 70–99)

## 2019-01-21 LAB — MAGNESIUM: Magnesium: 2 mg/dL (ref 1.7–2.4)

## 2019-01-21 MED ORDER — ENOXAPARIN SODIUM 40 MG/0.4ML ~~LOC~~ SOLN
40.0000 mg | SUBCUTANEOUS | Status: DC
  Administered 2019-01-22 – 2019-01-27 (×6): 40 mg via SUBCUTANEOUS
  Filled 2019-01-21 (×6): qty 0.4

## 2019-01-21 MED ORDER — LORAZEPAM 2 MG/ML IJ SOLN
0.5000 mg | Freq: Once | INTRAMUSCULAR | Status: AC
  Administered 2019-01-21: 0.5 mg via INTRAVENOUS
  Filled 2019-01-21: qty 1

## 2019-01-21 MED ORDER — ENOXAPARIN SODIUM 30 MG/0.3ML ~~LOC~~ SOLN: 30.0000 mg | SUBCUTANEOUS | 0 refills | Status: DC

## 2019-01-21 MED ORDER — IOHEXOL 350 MG/ML SOLN
140.0000 mL | Freq: Once | INTRAVENOUS | Status: AC | PRN
  Administered 2019-01-21: 130 mL via INTRAVENOUS

## 2019-01-21 NOTE — Progress Notes (Addendum)
PROGRESS NOTE    Shelly Cervantes  [id number] DOB: 07-May-1935 DOA: 01/18/2019 PCP: [name], MD     Brief Narrative:  Shelly Cervantes is a 83 y.o. female with medical history significant for Alzheimer's dementia, breast cancer status post definitive surgery, hypertension, and anxiety, now presenting to the emergency department for evaluation of left hip pain after a fall.  Per husband report, patient has had recent gait difficulty with frequent falls, reporting that this is the fourth fall she has had in the past few weeks.  She had not been complaining of anything leading up to this and seemed to be having an uneventful day.  Patient denies losing consciousness or experiencing any chest pain or lightheadedness prior to the fall.  Her husband heard her yell from another room, found her on the floor complaining of severe left hip pain, and called EMS. Radiographs of the hip and pelvis demonstrate acute nondisplaced left intertrochanteric femur fracture as well as more subacute appearing nondisplaced fractures of the right superior and inferior pubic rami. Orthopedic surgery was consulted.  She underwent IM fixation of the left hip on 10/9 with Dr. Stann Mainland.  New events last 24 hours / Subjective: On my examination this morning, RN in the room assessing patient. Concern regarding her elevated BP and new left sided facial droop and left sided weakness. Patient herself has no complaints, she is pleasantly confused, oriented to self and place but not to situation or year, which seems to be her baseline.   Assessment & Plan:   Principal Problem:   Closed fracture of left hip (HCC) Active Problems:   Malignant neoplasm of upper-outer quadrant of left breast in female, estrogen receptor positive ([place])   Late onset Alzheimer's disease without behavioral disturbance (HCC)   Hypokalemia   Hypertensive urgency   Prolonged QT interval   Left hip fracture after fall -Orthopedic surgery following   -Status post IM fixation 10/9 with Dr. Stann Mainland -Pain control -PT OT evaluation recommending SNF   Left facial droop -Did not appreciate this in previous exams, no history of facial droop in the past per chart review, no history of stroke in the past  -Stat CT head ordered. EKG ordered. Blood sugar 82. Code stroke called by RN  -NPO. SLP ordered   Prolonged QT interval -EKG reviewed independently today which revealed normal sinus rhythm with QTC 482, which is an improvement from the EKG in the emergency department which revealed QTC 524 -Repeat EKG reviewed independently, NSR, no significant ST changes, QTc 449    Essential hypertension -Hold off on antihypertensive until stroke work up complete   Alzheimer's dementia -Continue Namenda, Aricept  Frequent falls -CT head revealed ventriculomegaly.  Concern for NPH.  Review discussed over the phone with neurology, who reviewed imaging.  He recommends outpatient referral to neurosurgery for continued work-up, patient will need a lumbar puncture for definitive diagnosis.  Depression, anxiety -Continue Wellbutrin, Atarax as needed, Zoloft, trazodone nightly   DVT prophylaxis: Lovenox Code Status: Full code Family Communication: Called all 5 phone numbers listed under emergency contact, I was eventually able to get ahold of the husband to give him updates Disposition Plan: Stroke work up today. SNF when stable.    Consultants:   Orthopedic surgery  Procedures:   None  Antimicrobials:  Anti-infectives (From admission, onward)   Start     Dose/Rate Route Frequency Ordered Stop   01/19/19 1200  ceFAZolin (ANCEF) IVPB 2g/100 mL premix  Status:  Discontinued  2 g 200 mL/hr over 30 Minutes Intravenous To Short Stay 01/19/19 0149 01/19/19 1717   01/19/19 0600  vancomycin (VANCOCIN) IVPB 1000 mg/200 mL premix     1,000 mg 200 mL/hr over 60 Minutes Intravenous On call to O.R. 01/18/19 2035 01/19/19 1516       Objective:  Vitals:   01/20/19 1615 01/20/19 2023 01/21/19 0416 01/21/19 0835  BP: (!) 144/65 (!) 149/102 (!) 175/105 (!) 198/88  Pulse: 79 81 87   Resp:  19    Temp: 98 F (36.7 C) 98.4 F (36.9 C) 98 F (36.7 C)   TempSrc: Oral Oral Oral   SpO2: 93% 95% 94%   Weight:      Height:        Intake/Output Summary (Last 24 hours) at 01/21/2019 0851 Last data filed at 01/20/2019 1400 Gross per 24 hour  Intake 588 ml  Output 1 ml  Net 587 ml   Filed Weights   01/19/19 0135 01/19/19 1414  Weight: 45.4 kg 45.4 kg    Examination: General exam: Appears calm and comfortable  Respiratory system: Clear to auscultation. Respiratory effort normal. Cardiovascular system: S1 & S2 heard, RRR. No pedal edema. Gastrointestinal system: Abdomen is nondistended, soft and nontender. Normal bowel sounds heard. Central nervous system: Alert and oriented to self and hospital "Shelly Cervantes" but not to year or situation. Strength 5/5 in extremities with exception of LLE (site of left hip surgery). EOMI. +Left lower facial droop   Extremities: Symmetric in appearance bilaterally  Skin: No rashes, lesions or ulcers on exposed skin  Psychiatry: Dementia, pleasantly confused    Data Reviewed: I have personally reviewed following labs and imaging studies  CBC: Recent Labs  Lab 01/18/19 1645 01/19/19 0236 01/21/19 0233  WBC 9.5 8.3 9.1  NEUTROABS 8.0* 6.9  --   HGB 12.2 11.2* 9.7*  HCT 36.4 32.0* 28.5*  MCV 89.0 87.0 89.1  PLT 204 182 0000000   Basic Metabolic Panel: Recent Labs  Lab 01/18/19 1645 01/19/19 0236 01/19/19 1208 01/20/19 0459 01/21/19 0233  NA 138 138  --  136 136  K 2.5* 3.2*  --  3.3* 3.5  CL 100 101  --  102 103  CO2 25 23  --  23 21*  GLUCOSE 126* 122*  --  97 84  BUN 11 10  --  13 21  CREATININE 0.88 0.96  --  0.95 0.82  CALCIUM 9.0 9.3  --  9.1 8.9  MG  --   --  1.6* 2.0 2.0   GFR: Estimated Creatinine Clearance: 38.6 mL/min (by C-G formula based on SCr of 0.82 mg/dL). Liver  Function Tests: Recent Labs  Lab 01/18/19 1645 01/19/19 0236  AST 19 24  ALT 13 16  ALKPHOS 124 117  BILITOT 0.8 1.2  PROT 6.7 6.6  ALBUMIN 3.6 3.5   No results for input(s): LIPASE, AMYLASE in the last 168 hours. No results for input(s): AMMONIA in the last 168 hours. Coagulation Profile: Recent Labs  Lab 01/18/19 1645 01/19/19 0236  INR 1.0 1.1   Cardiac Enzymes: No results for input(s): CKTOTAL, CKMB, CKMBINDEX, TROPONINI in the last 168 hours. BNP (last 3 results) No results for input(s): PROBNP in the last 8760 hours. HbA1C: No results for input(s): HGBA1C in the last 72 hours. CBG: No results for input(s): GLUCAP in the last 168 hours. Lipid Profile: No results for input(s): CHOL, HDL, LDLCALC, TRIG, CHOLHDL, LDLDIRECT in the last 72 hours. Thyroid Function Tests: No results  for input(s): TSH, T4TOTAL, FREET4, T3FREE, THYROIDAB in the last 72 hours. Anemia Panel: No results for input(s): VITAMINB12, FOLATE, FERRITIN, TIBC, IRON, RETICCTPCT in the last 72 hours. Sepsis Labs: No results for input(s): PROCALCITON, LATICACIDVEN in the last 168 hours.  Recent Results (from the past 240 hour(s))  SARS Coronavirus 2 by RT PCR (hospital order, performed in [facility] hospital lab) Nasopharyngeal Nasopharyngeal Swab     Status: None   Collection Time: 01/18/19  7:17 PM   Specimen: Nasopharyngeal Swab  Result Value Ref Range Status   SARS Coronavirus 2 NEGATIVE NEGATIVE Final    Comment: (NOTE) If result is NEGATIVE SARS-CoV-2 target nucleic acids are NOT DETECTED. The SARS-CoV-2 RNA is generally detectable in upper and lower  respiratory specimens during the acute phase of infection. The lowest  concentration of SARS-CoV-2 viral copies this assay can detect is 250  copies / mL. A negative result does not preclude SARS-CoV-2 infection  and should not be used as the sole basis for treatment or other  patient management decisions.  A negative result may occur with   improper specimen collection / handling, submission of specimen other  than nasopharyngeal swab, presence of viral mutation(s) within the  areas targeted by this assay, and inadequate number of viral copies  (<250 copies / mL). A negative result must be combined with clinical  observations, patient history, and epidemiological information. If result is POSITIVE SARS-CoV-2 target nucleic acids are DETECTED. The SARS-CoV-2 RNA is generally detectable in upper and lower  respiratory specimens dur ing the acute phase of infection.  Positive  results are indicative of active infection with SARS-CoV-2.  Clinical  correlation with patient history and other diagnostic information is  necessary to determine patient infection status.  Positive results do  not rule out bacterial infection or co-infection with other viruses. If result is PRESUMPTIVE POSTIVE SARS-CoV-2 nucleic acids MAY BE PRESENT.   A presumptive positive result was obtained on the submitted specimen  and confirmed on repeat testing.  While 2019 novel coronavirus  (SARS-CoV-2) nucleic acids may be present in the submitted sample  additional confirmatory testing may be necessary for epidemiological  and / or clinical management purposes  to differentiate between  SARS-CoV-2 and other Sarbecovirus currently known to infect humans.  If clinically indicated additional testing with an alternate test  methodology [phone]) is advised. The SARS-CoV-2 RNA is generally  detectable in upper and lower respiratory sp ecimens during the acute  phase of infection. The expected result is Negative. Fact Sheet for Patients:  StrictlyIdeas.no Fact Sheet for Healthcare Providers: BankingDealers.co.za This test is not yet approved or cleared by the [place] FDA and has been authorized for detection and/or diagnosis of SARS-CoV-2 by FDA under an Emergency Use Authorization (EUA).  This EUA will  remain in effect (meaning this test can be used) for the duration of the COVID-19 declaration under Section 564(b)(1) of the Act, 21 U.S.C. section 360bbb-3(b)(1), unless the authorization is terminated or revoked sooner. Performed at [facility], [place] [address], [place], [place] [address]   Surgical pcr screen     Status: None   Collection Time: 01/18/19 10:14 PM   Specimen: Nasal Mucosa; Nasal Swab  Result Value Ref Range Status   MRSA, PCR NEGATIVE NEGATIVE Final   Staphylococcus aureus NEGATIVE NEGATIVE Final    Comment: (NOTE) The Xpert SA Assay (FDA approved for NASAL specimens in patients 52 years of age and older), is one component of a comprehensive surveillance  program. It is not intended to diagnose infection nor to guide or monitor treatment. Performed at [facility], [place] [address], [place], [place] [address]       Radiology Studies: Dg C-arm 1-60 Min  Result Date: 01/19/2019 CLINICAL DATA:  The patient suffered a left intertrochanteric fracture in a fall 01/18/2019. Initial encounter. EXAM: LEFT FEMUR 2 VIEWS; DG C-ARM 1-60 MIN COMPARISON:  Plain film of the pelvis 01/18/2019. FINDINGS: Three fluoroscopic spot views of the right hip demonstrate a screw in the femoral neck and short intramedullary nail with a single distal screw for fixation of an intertrochanteric fracture. Position and alignment are near anatomic. No acute abnormality. IMPRESSION: Intraoperative imaging for fixation of an intertrochanteric fracture. Electronically Signed   By: Inge Rise M.D.   On: 01/19/2019 17:52   Dg Femur Min 2 Views Left  Result Date: 01/19/2019 CLINICAL DATA:  The patient suffered a left intertrochanteric fracture in a fall 01/18/2019. Initial encounter. EXAM: LEFT FEMUR 2 VIEWS; DG C-ARM 1-60 MIN COMPARISON:  Plain film of the pelvis 01/18/2019. FINDINGS: Three fluoroscopic spot views of the right hip demonstrate a screw in the femoral neck and short  intramedullary nail with a single distal screw for fixation of an intertrochanteric fracture. Position and alignment are near anatomic. No acute abnormality. IMPRESSION: Intraoperative imaging for fixation of an intertrochanteric fracture. Electronically Signed   By: Inge Rise M.D.   On: 01/19/2019 17:52      Scheduled Meds: . aspirin  81 mg Oral QHS  . buPROPion  150 mg Oral Daily  . docusate sodium  100 mg Oral BID  . donepezil  5 mg Oral QHS  . enoxaparin (LOVENOX) injection  30 mg Subcutaneous Q24H  . feeding supplement (ENSURE ENLIVE)  237 mL Oral BID BM  . fluticasone  1 spray Each Nare BID  . megestrol  20 mg Oral Daily  . memantine  10 mg Oral Daily  . pantoprazole  40 mg Oral Daily  . sertraline  100 mg Oral Daily  . timolol  1 drop Both Eyes Daily  . traZODone  50 mg Oral QHS   Continuous Infusions: . lactated ringers 10 mL/hr at 01/19/19 1416  . methocarbamol (ROBAXIN) IV       LOS: 3 days      Time spent: 40 minutes   Dessa Phi, DO [facility] 01/21/2019, 8:51 AM   Available via Epic secure chat 7am-7pm After these hours, please refer to coverage provider listed on [email or web address]

## 2019-01-21 NOTE — Progress Notes (Signed)
SLP Cancellation Note  Patient Details Name: PRICSILLA RUDE MRN: [id number] DOB: [birth year]   Cancelled treatment:       Reason Eval/Treat Not Completed: Patient at procedure or test/unavailable  Will continue efforts.   Shelly Flatten, MA, [facility] SLP [phone]  Lamar Sprinkles 01/21/2019, 9:30 AM

## 2019-01-21 NOTE — Evaluation (Signed)
Clinical/Bedside Swallow Evaluation Patient Details  Name: Shelly Cervantes MRN: [id number] Date of Birth: 25-Jan-1936  Today's Date: 01/21/2019 Time: SLP Start Time (ACUTE ONLY): [id number] SLP Stop Time (ACUTE ONLY): 1555 SLP Time Calculation (min) (ACUTE ONLY): 22 min  Past Medical History:  Past Medical History:  Diagnosis Date  . Alzheimer's disease ([place])   . Anxiety   . Breast cancer ([place])    left  . Depression   . GERD (gastroesophageal reflux disease)    occ  . High cholesterol   . Hypertension    Past Surgical History:  Past Surgical History:  Procedure Laterality Date  . BREAST LUMPECTOMY WITH RADIOACTIVE SEED AND SENTINEL LYMPH NODE BIOPSY Left 11/25/2016   Procedure: LEFT BREAST LUMPECTOMY WITH RADIOACTIVE SEED AND AXILLARY SENTINEL LYMPH NODE BIOPSY;  Surgeon: Excell Seltzer, MD;  Location: [facility];  Service: General;  Laterality: Left;  . EYE SURGERY Bilateral    cataracts   HPI:  Shelly Cervantes is a 83 y.o. female with medical history significant for Alzheimer's dementia, breast cancer status post definitive surgery, hypertension, and anxiety, now presenting to the emergency department for evaluation of left hip pain after a fall.  Per husband report, patient has had recent gait difficulty with frequent falls, reporting that this is the fourth fall she has had in the past few weeks.  RN noticed new onset facial droop and Code Stroke was called earlier this morning (10/11).  CT and MRI were negative for acute cranial changes.     Assessment / Plan / Recommendation Clinical Impression  Pt was encountered asleep in bed with husband present at bedside.  Per RN, pt received Ativan prior to MRI this afternoon, and pt was unable to maintain alertness or open her eyes throughout this evaluation.  She was observed to have what appeared to be uncontrollable gross body movements with intermittent incoherent verbalizations.   Husband reported no dysphagia hx prior to admission.  Unable to  perform oral mechanism exam secondary to pt's inability to follow commands.  Pt was observed with trials of small ice chips, thin liquid, and puree.  She exhibited good bolus acceptance despite reduced labial closure and she presented with appropriate lingual manipulation and consistent swallow initiation.  Suspect that swallow initiation may have been delayed.  Pt was unable to independently draw liquid from straw; therefore pipette was used.  Pt exhibited a delayed throat clear following 1/4 thin liquid trials, otherwise no clinical s/sx of aspiration were observed.  Attempted a small puree trial; however, pt immediately expelled the bolus from her oral cavity.  Due to lethargy, recommend continuation of NPO at this time with frequent oral care and 1-2 small ice chips or sips of water PRN for comfort per pt request after oral care.  Suspect that po tolerance will increase with improved alertness.    SLP Visit Diagnosis: Dysphagia, unspecified (R13.10)    Aspiration Risk  Mild aspiration risk    Diet Recommendation NPO   Medication Administration: Crushed with puree(if necessary ) Postural Changes: Seated upright at 90 degrees    Other  Recommendations Oral Care Recommendations: Oral care QID   Follow up Recommendations Skilled Nursing facility      Frequency and Duration min 2x/week  2 weeks       Prognosis Prognosis for Safe Diet Advancement: Fair Barriers to Reach Goals: Cognitive deficits      Swallow Study   General HPI: Shelly Cervantes is a 83 y.o. female with medical history significant  for Alzheimer's dementia, breast cancer status post definitive surgery, hypertension, and anxiety, now presenting to the emergency department for evaluation of left hip pain after a fall.  Per husband report, patient has had recent gait difficulty with frequent falls, reporting that this is the fourth fall she has had in the past few weeks.  RN noticed new onset facial droop and Code Stroke was  called earlier this morning (10/11).  CT and MRI were negative for acute cranial changes.   Type of Study: Bedside Swallow Evaluation Previous Swallow Assessment: None documented  Diet Prior to this Study: NPO Temperature Spikes Noted: No Respiratory Status: Room air History of Recent Intubation: No Behavior/Cognition: Confused;Agitated;Doesn't follow directions;Lethargic/Drowsy Oral Cavity Assessment: (Unable to evaluate ) Oral Care Completed by SLP: No Oral Cavity - Dentition: (Unable to evaluate) Self-Feeding Abilities: Needs assist Patient Positioning: Upright in bed Volitional Swallow: Unable to elicit    Oral/Motor/Sensory Function Overall Oral Motor/Sensory Function: (Unable to evaluate)   Ice Chips Ice chips: Impaired Oral Phase Impairments: Reduced labial seal;Impaired mastication Oral Phase Functional Implications: Prolonged oral transit Pharyngeal Phase Impairments: Suspected delayed Swallow   Thin Liquid Thin Liquid: Impaired Presentation: Spoon;Straw Oral Phase Impairments: Reduced labial seal Oral Phase Functional Implications: Prolonged oral transit Pharyngeal  Phase Impairments: Suspected delayed Swallow;Throat Clearing - Delayed    Nectar Thick Nectar Thick Liquid: Not tested   Honey Thick Honey Thick Liquid: Not tested   Puree Puree: Impaired Presentation: Spoon Oral Phase Impairments: Reduced lingual movement/coordination   Solid     Solid: Not tested     Bretta Bang, M.S., [place] Office: [phone]   Shelly Cervantes 01/21/2019,5:15 PM

## 2019-01-21 NOTE — Significant Event (Addendum)
Rapid Response Event Note  Overview: NEUROLOGICAL       Initial Focused Assessment: Called by RN, patient with new onset facial droop and left sided weakness.  RN and Dr Maylene Roes at bedside, order for CT placed. Asked RN to call Code Stroke.  No hx of CVA.   On arrival, 0840, patient in bed, ECG being done. NIH 3, see Epic. Patient hypertensive SBP 200, no complaints per patient.   Interventions: CODE STROKE CALLED  NIHSS = 3, see Epic CT/CT-A done Dr Rory Percy present in [place] - PIV placed for CT-P, done   Plan of Care (if not transferred): -Patient taken back to room, per Dr Rory Percy - to put in neuro orders, follow-up -Handoff to [place] at bedside on return, RN has updated patient's family on events -SBP 200, remains elevated -VS Q15 please    -Call RRT as needed   Event Summary:  End call: 0958     Carl Best

## 2019-01-21 NOTE — Progress Notes (Signed)
*  PRELIMINARY RESULTS* Echocardiogram 2D Echocardiogram has been performed.  Shelly Cervantes 01/21/2019, 5:50 PM

## 2019-01-21 NOTE — Progress Notes (Signed)
   Subjective: 2 Days Post-Op Procedure(s) (LRB): INTRAMEDULLARY (IM) NAIL FEMORAL (Left) Patient reports pain as mild.   Patient seen in rounds for Dr. Stann Mainland. Patient is well, and has had no acute complaints or problems. Reports minimal discomfort. Denies SOB and chest pain. Voiding well. Reports BM yesterday.    Objective: Vital signs in last 24 hours: Temp:  [98 F (36.7 C)-98.5 F (36.9 C)] 98 F (36.7 C) (10/11 0416) Pulse Rate:  [79-87] 87 (10/11 0416) Resp:  [19] 19 (10/10 2023) BP: (144-175)/(65-105) 175/105 (10/11 0416) SpO2:  [93 %-97 %] 94 % (10/11 0416)  Intake/Output from previous day:  Intake/Output Summary (Last 24 hours) at 01/21/2019 0804 Last data filed at 01/20/2019 1400 Gross per 24 hour  Intake 588 ml  Output 1 ml  Net 587 ml     Labs: Recent Labs    01/18/19 1645 01/19/19 0236 01/21/19 0233  HGB 12.2 11.2* 9.7*   Recent Labs    01/19/19 0236 01/21/19 0233  WBC 8.3 9.1  RBC 3.68* 3.20*  HCT 32.0* 28.5*  PLT 182 193   Recent Labs    01/20/19 0459 01/21/19 0233  NA 136 136  K 3.3* 3.5  CL 102 103  CO2 23 21*  BUN 13 21  CREATININE 0.95 0.82  GLUCOSE 97 84  CALCIUM 9.1 8.9   Recent Labs    01/18/19 1645 01/19/19 0236  INR 1.0 1.1    EXAM General - Patient is Alert and Oriented Extremity - Neurologically intact Intact pulses distally Dorsiflexion/Plantar flexion intact No cellulitis present Compartment soft Dressing/Incision - clean, dry, no drainage Motor Function - intact, moving foot and toes well on exam.   Past Medical History:  Diagnosis Date  . Alzheimer's disease ([place])   . Anxiety   . Breast cancer ([place])    left  . Depression   . GERD (gastroesophageal reflux disease)    occ  . High cholesterol   . Hypertension     Assessment/Plan: 2 Days Post-Op Procedure(s) (LRB): INTRAMEDULLARY (IM) NAIL FEMORAL (Left) Principal Problem:   Closed fracture of left hip (HCC) Active Problems:   Malignant neoplasm  of upper-outer quadrant of left breast in female, estrogen receptor positive ([place])   Late onset Alzheimer's disease without behavioral disturbance (HCC)   Hypokalemia   Hypertensive urgency   Prolonged QT interval  Estimated body mass index is 16.66 kg/m as calculated from the following:   Height as of this encounter: 5\' 5"  (1.651 m).   Weight as of this encounter: 45.4 kg. Advance diet Up with therapy  DVT Prophylaxis - Aspirin and Lovenox Weight-Bearing as tolerated with walker  Continue PT today. DC planning per hospitalist. Stable from ortho standpoint once meeting PT goals. ASA and Lovenox for DVT ppx. Follow up with Dr. Stann Mainland in 2 weeks.   Ardeen Jourdain, PA-C Orthopaedic Surgery 01/21/2019, 8:04 AM

## 2019-01-21 NOTE — Consult Note (Addendum)
NEURO HOSPITALIST  CONSULT   Requesting Physician: Dr. Maylene Roes    Chief Complaint:  Facial droop/ AMS  History obtained from:  Patient / RN  HPI:                                                                                                                                         Shelly Cervantes is an 83 y.o. female  With PMH HTN, HLD, Alzheimer, breast cancer who presented to [facility] for left hip fracture. Code stroke called on 01/21/2019 for left facial droop and " not acting right".   Per RN on her initial assessment at [phone] patient had facial droop and was not acting quite like herself. Unknown last known normal. Patient had a  IM nail left femoral repair on 01/19/2019. On lovenox for DVT prophylaxis, but none given today. No prior stroke history reported or in chart.   Hosp course: 01/19/2019: IM nail left femoral repair 01/21/2019: code stroke initiated CTH: no hemorrhage CTA/P: no LVO; core of zero BP: 202/88 BG: 84 creatinine: 0.82  Last known well: Unclear. tPA Given: No: Last known well outside of window/unknown Modified Rankin: Rankin Score=4 NIHSS:3; facial droop and questions  Patient does have a history of memory disturbance and sees Ward Givens, NP for memory disturbance. On aricept and Namenda.   Past Medical History:  Diagnosis Date  . Alzheimer's disease ([place])   . Anxiety   . Breast cancer ([place])    left  . Depression   . GERD (gastroesophageal reflux disease)    occ  . High cholesterol   . Hypertension     Past Surgical History:  Procedure Laterality Date  . BREAST LUMPECTOMY WITH RADIOACTIVE SEED AND SENTINEL LYMPH NODE BIOPSY Left 11/25/2016   Procedure: LEFT BREAST LUMPECTOMY WITH RADIOACTIVE SEED AND AXILLARY SENTINEL LYMPH NODE BIOPSY;  Surgeon: Excell Seltzer, MD;  Location: [facility];  Service: General;  Laterality: Left;  . EYE SURGERY Bilateral    cataracts    Family History  Problem Relation Age of  Onset  . Colon cancer Mother   . Cancer Father   . Lung cancer Father   . Dementia Sister       Social History:  reports that she has never smoked. She has never used smokeless tobacco. She reports current alcohol use of about 3.0 standard drinks of alcohol per week. She reports that she does not use drugs.  Allergies:  Allergies  Allergen Reactions  . Codeine Rash  . Penicillins Rash    Did it involve swelling of the face/tongue/throat, SOB, or low BP? No Did it involve sudden or severe  rash/hives, skin peeling, or any reaction on the inside of your mouth or nose? No Did you need to seek medical attention at a hospital or doctor's office? No When did it last happen?Many years ago. If all above answers are "NO", may proceed with cephalosporin use.     Medications:                                                                                                                           Scheduled: . aspirin  81 mg Oral QHS  . buPROPion  150 mg Oral Daily  . docusate sodium  100 mg Oral BID  . donepezil  5 mg Oral QHS  . enoxaparin (LOVENOX) injection  30 mg Subcutaneous Q24H  . feeding supplement (ENSURE ENLIVE)  237 mL Oral BID BM  . fluticasone  1 spray Each Nare BID  . megestrol  20 mg Oral Daily  . memantine  10 mg Oral Daily  . pantoprazole  40 mg Oral Daily  . sertraline  100 mg Oral Daily  . timolol  1 drop Both Eyes Daily  . traZODone  50 mg Oral QHS   Continuous: . lactated ringers 10 mL/hr at 01/19/19 1416  . methocarbamol (ROBAXIN) IV     [id number] (SUBLIMAZE) injection, hydrOXYzine, methocarbamol **OR** methocarbamol (ROBAXIN) IV, senna-docusate   ROS:                                                                                                                                       ROS was performed and is negative except as noted in HPI    General Examination:                                                                                                       Blood pressure (!) 198/88, pulse 87, temperature 98 F (36.7 C), temperature source Oral, resp. rate 19, height 5\' 5"  (1.651 m), weight 45.4 kg, SpO2 94 %.  HEENT-  Normocephalic, no lesions, without obvious abnormality.  Normal external eye and conjunctiva. Cardiovascular- audible, pulses palpable throughout  Lungs-, no excessive working breathing.  Saturations within normal limits Extremities- Warm, dry and intact Musculoskeletal-no joint tenderness, deformity or swelling Skin-warm and dry, no hyperpigmentation, vitiligo, or suspicious lesions  Neurological Examination Mental Status: Alert, oriented x2, could not tell her age or date correctly.  Thought content appropriate. Naming and repetition intact.  Speech fluent without evidence of aphasia.  no dysarthria. Able to follow commands without difficulty. Cranial Nerves: II: visual fields intact IV, VI: ptosis not present, extra-ocular motions intact bilaterally, pupils equal, round, reactive to light and accommodation V,VII: smile asymmetric, left facial droop, facial light touch sensation normal bilaterally VIII: hearing normal bilaterally IX,X: uvula rises midline XI: bilateral shoulder shrug XII: midline tongue extension Motor: Right : Upper extremity   5/5  Left:     Upper extremity   5/5  Lower extremity   5/5   Lower extremity   4/5 ( limited d/t hip surgery) Tone and bulk:normal tone throughout; no atrophy noted Sensory:  light touch intact throughout, bilaterally Plantars: Right: downgoing   Left: downgoing Cerebellar: No ataxia noted; unable to perform with LLE. Gait: deferred   Lab Results: Basic Metabolic Panel: Recent Labs  Lab 01/18/19 1645 01/19/19 0236 01/19/19 1208 01/20/19 0459 01/21/19 0233  NA 138 138  --  136 136  K 2.5* 3.2*  --  3.3* 3.5  CL 100 101  --  102 103  CO2 25 23  --  23 21*  GLUCOSE 126* 122*  --  97 84  BUN 11 10  --  13 21  CREATININE 0.88 0.96  --  0.95 0.82  CALCIUM 9.0  9.3  --  9.1 8.9  MG  --   --  1.6* 2.0 2.0    CBC: Recent Labs  Lab 01/18/19 1645 01/19/19 0236 01/21/19 0233  WBC 9.5 8.3 9.1  NEUTROABS 8.0* 6.9  --   HGB 12.2 11.2* 9.7*  HCT 36.4 32.0* 28.5*  MCV 89.0 87.0 89.1  PLT 204 182 193    CBG: Recent Labs  Lab 01/21/19 0842  GLUCAP 82    Imaging: Dg C-arm 1-60 Min  Result Date: 01/19/2019 CLINICAL DATA:  The patient suffered a left intertrochanteric fracture in a fall 01/18/2019. Initial encounter. EXAM: LEFT FEMUR 2 VIEWS; DG C-ARM 1-60 MIN COMPARISON:  Plain film of the pelvis 01/18/2019. FINDINGS: Three fluoroscopic spot views of the right hip demonstrate a screw in the femoral neck and short intramedullary nail with a single distal screw for fixation of an intertrochanteric fracture. Position and alignment are near anatomic. No acute abnormality. IMPRESSION: Intraoperative imaging for fixation of an intertrochanteric fracture. Electronically Signed   By: Inge Rise M.D.   On: 01/19/2019 17:52   Dg Femur Min 2 Views Left  Result Date: 01/19/2019 CLINICAL DATA:  The patient suffered a left intertrochanteric fracture in a fall 01/18/2019. Initial encounter. EXAM: LEFT FEMUR 2 VIEWS; DG C-ARM 1-60 MIN COMPARISON:  Plain film of the pelvis 01/18/2019. FINDINGS: Three fluoroscopic spot views of the right hip demonstrate a screw in the femoral neck and short intramedullary nail with a single distal screw for fixation of an intertrochanteric fracture. Position and alignment are near anatomic. No acute abnormality. IMPRESSION: Intraoperative imaging for fixation of an intertrochanteric fracture. Electronically Signed   By: Inge Rise M.D.   On: 01/19/2019 17:52       Laurey Morale, MSN,  NP-C [facility] [phone]  01/21/2019, 9:04 AM   Attending physician note to follow with Assessment and plan .  Attending physician Patient seen and examined as an acute code stroke. Noted left facial droop which  the patient shares is not normal for her.  Also unable to answer orientation questions.  NIH 3. No prior mention of the left facial droop. Likely small vessel stroke. No last known well- probably not acting right for over 12 hours and refusing medications.  The facial droop noted this morning. Low NIH, unclear last known normal-prevented her from getting TPA. No evidence of LVO on exam and imaging-hence not a candidate for EVT. I have independently reviewed imaging-CT, CTA and CT perfusion. Will require stroke work-up  Assessment: 83 y.o. female With PMH HTN, HLD, Alzheimer, breast cancer who presented to [facility] for left hip fracture. Code stroke called on 01/21/2019 for left facial droop and " not acting right". CTH no hemorrhage. Unchanged hydrocephalus. Not tPA candidate d/t last known well of last night.   CTA:  No LVO CTP: core zero. Stroke work-up recommended. Does have left-sided facial droop which is new. Stroke Risk Factors - hyperlipidemia and hypertension  Impression: Likely small vessel etiology stroke versus cardioembolic infarct.   Recommendations: -- BP goal : Permissive HTN upto 220/110 mmHg  --MRI Brain  --CTA --Echocardiogram -- Prophylactic therapy-Antiplatelet med-ASA -- High intensity Statin if LDL > 70 -- HgbA1c, fasting lipid panel -- PT consult, OT consult, Speech consult --Telemetry monitoring --Frequent neuro checks --Stroke swallow screen   --please page stroke NP  Or  PA  Or MD from 8am -4 pm  as this patient from this time will be  followed by the stroke.   You can look them up on [email or web address]  Password [email or web address]  -- Amie Portland, MD [facility] Pager: [phone] If 7pm to 7am, please call on call as listed on AMION.  CRITICAL CARE ATTESTATION Performed by: Amie Portland, MD Total critical care time: 45 minutes Critical care time was exclusive of separately billable procedures and treating other patients and/or supervising  APPs/Residents/Students Critical care was necessary to treat or prevent imminent or life-threatening deterioration due to acute ischemic stroke This patient is critically ill and at significant risk for neurological worsening and/or death and care requires constant monitoring. Critical care was time spent personally by me on the following activities: development of treatment plan with patient and/or surrogate as well as nursing, discussions with consultants, evaluation of patient's response to treatment, examination of patient, obtaining history from patient or surrogate, ordering and performing treatments and interventions, ordering and review of laboratory studies, ordering and review of radiographic studies, pulse oximetry, re-evaluation of patient's condition, participation in multidisciplinary rounds and medical decision making of high complexity in the care of this patient.

## 2019-01-21 NOTE — Plan of Care (Signed)

## 2019-01-22 ENCOUNTER — Encounter (HOSPITAL_COMMUNITY): Payer: Self-pay | Admitting: Orthopedic Surgery

## 2019-01-22 DIAGNOSIS — I6330 Cerebral infarction due to thrombosis of unspecified cerebral artery: Secondary | ICD-10-CM | POA: Insufficient documentation

## 2019-01-22 LAB — CBC
HCT: 30 % — ABNORMAL LOW (ref 36.0–46.0)
Hemoglobin: 9.9 g/dL — ABNORMAL LOW (ref 12.0–15.0)
MCH: 30.1 pg (ref 26.0–34.0)
MCHC: 33 g/dL (ref 30.0–36.0)
MCV: 91.2 fL (ref 80.0–100.0)
Platelets: 228 10*3/uL (ref 150–400)
RBC: 3.29 MIL/uL — ABNORMAL LOW (ref 3.87–5.11)
RDW: 13.9 % (ref 11.5–15.5)
WBC: 7.5 10*3/uL (ref 4.0–10.5)
nRBC: 0 % (ref 0.0–0.2)

## 2019-01-22 LAB — BASIC METABOLIC PANEL
Anion gap: 16 — ABNORMAL HIGH (ref 5–15)
BUN: 16 mg/dL (ref 8–23)
CO2: 18 mmol/L — ABNORMAL LOW (ref 22–32)
Calcium: 8.9 mg/dL (ref 8.9–10.3)
Chloride: 107 mmol/L (ref 98–111)
Creatinine, Ser: 0.87 mg/dL (ref 0.44–1.00)
GFR calc Af Amer: >60 mL/min (ref >60)
GFR calc non Af Amer: >60 mL/min (ref >60)
Glucose, Bld: 77 mg/dL (ref 70–99)
Potassium: 3.3 mmol/L — ABNORMAL LOW (ref 3.5–5.1)
Sodium: 141 mmol/L (ref 135–145)

## 2019-01-22 MED ORDER — WHITE PETROLATUM EX OINT
TOPICAL_OINTMENT | CUTANEOUS | Status: AC
  Administered 2019-01-22: 0.2
  Filled 2019-01-22: qty 28.35

## 2019-01-22 MED ORDER — LABETALOL HCL 5 MG/ML IV SOLN
5.0000 mg | Freq: Once | INTRAVENOUS | Status: AC
  Administered 2019-01-22: 5 mg via INTRAVENOUS
  Filled 2019-01-22: qty 4

## 2019-01-22 MED ORDER — HYDRALAZINE HCL 20 MG/ML IJ SOLN
10.0000 mg | Freq: Once | INTRAMUSCULAR | Status: AC
  Administered 2019-01-22: 10 mg via INTRAVENOUS
  Filled 2019-01-22: qty 1

## 2019-01-22 MED ORDER — POTASSIUM CHLORIDE 10 MEQ/100ML IV SOLN
10.0000 meq | INTRAVENOUS | Status: AC
  Administered 2019-01-22 (×4): 10 meq via INTRAVENOUS
  Filled 2019-01-22 (×4): qty 100

## 2019-01-22 MED ORDER — LISINOPRIL 20 MG PO TABS
20.0000 mg | ORAL_TABLET | Freq: Every day | ORAL | Status: DC
  Administered 2019-01-22: 20 mg via ORAL
  Filled 2019-01-22: qty 1

## 2019-01-22 NOTE — Progress Notes (Signed)
Due to patient NPO, 2200 oral meds held until advised otherwise. Notified Dr. Hilbert Bible.

## 2019-01-22 NOTE — Progress Notes (Signed)
  Speech Language Pathology Treatment: Dysphagia  Patient Details Name: DEKOTA KIRLIN MRN: [id number] DOB: May 06, 1935 Today's Date: 01/22/2019 Time: 3672-5500 SLP Time Calculation (min) (ACUTE ONLY): 14 min  Assessment / Plan / Recommendation Clinical Impression  Diagnostic treatment complete with focus on po readiness. Patient alert and pleasant today, able to converse well with therapist. Reports no h/o dysphagia. Able to consume regular solids and thin liquids with what appears to be an intact oropharyngeal swallow. Very mildly delayed oral transit of bolus and subtle throat clearing post swallow on initial few sips of thin liquid only. Overall, patient appears to be protecting airway well. Able to self feed with some assistance with set up. Recommend initiation of a po diet. No f/u indicated at this time.    HPI HPI: GINAMARIE BANFIELD is a 83 y.o. female with medical history significant for Alzheimer's dementia, breast cancer status post definitive surgery, hypertension, and anxiety, now presenting to the emergency department for evaluation of left hip pain after a fall.  Per husband report, patient has had recent gait difficulty with frequent falls, reporting that this is the fourth fall she has had in the past few weeks.  RN noticed new onset facial droop and Code Stroke was called earlier this morning (10/11).  CT and MRI were negative for acute cranial changes.        SLP Plan  All goals met;Discharge SLP treatment due to (comment)       Recommendations  Diet recommendations: Regular;Thin liquid Liquids provided via: Cup;Straw Medication Administration: Whole meds with liquid Supervision: Patient able to self feed Compensations: Slow rate;Small sips/bites Postural Changes and/or Swallow Maneuvers: Seated upright 90 degrees                Oral Care Recommendations: Oral care BID SLP Visit Diagnosis: Dysphagia, unspecified (R13.10) Plan: All goals met;Discharge SLP treatment due  to (comment)       GO             Gabriel Rainwater MA, CCC-SLP     Torri Langston Meryl 01/22/2019, 10:19 AM

## 2019-01-22 NOTE — Anesthesia Postprocedure Evaluation (Signed)
Anesthesia Post Note  Patient: Shelly Cervantes  Procedure(s) Performed: INTRAMEDULLARY (IM) NAIL FEMORAL (Left Leg Upper)     Patient location during evaluation: PACU Anesthesia Type: General Level of consciousness: awake and alert Pain management: pain level controlled Vital Signs Assessment: post-procedure vital signs reviewed and stable Respiratory status: spontaneous breathing, nonlabored ventilation and respiratory function stable Cardiovascular status: blood pressure returned to baseline and stable Postop Assessment: no apparent nausea or vomiting Anesthetic complications: no    Last Vitals:  Vitals:   01/22/19 0815 01/22/19 1616  BP: (!) 160/74 (!) 194/82  Pulse: 74 71  Resp: 16 17  Temp:  36.9 C  SpO2: 97% 98%    Last Pain:  Vitals:   01/22/19 1616  TempSrc: Oral  PainSc:    Pain Goal: Patients Stated Pain Goal: 0 (01/18/19 2035)                 [name]

## 2019-01-22 NOTE — Progress Notes (Signed)
Patient BP 190/84. Notified Dr. Hilbert Bible. Will continue monitoring until advised.

## 2019-01-22 NOTE — Progress Notes (Signed)
Patient BP rechecked after administering hydralazine. BP 188/80. Dr. Hilbert Bible notified. Will continue to monitor.

## 2019-01-22 NOTE — Progress Notes (Signed)
PROGRESS NOTE    Shelly Cervantes  [id number] DOB: 1936/01/18 DOA: 01/18/2019 PCP: [name], MD     Brief Narrative:  Shelly Cervantes is a 83 y.o. female with medical history significant for Alzheimer's dementia, breast cancer status post definitive surgery, hypertension, and anxiety, now presenting to the emergency department for evaluation of left hip pain after a fall.  Per husband report, patient has had recent gait difficulty with frequent falls, reporting that this is the fourth fall she has had in the past few weeks.  She had not been complaining of anything leading up to this and seemed to be having an uneventful day.  Patient denies losing consciousness or experiencing any chest pain or lightheadedness prior to the fall.  Her husband heard her yell from another room, found her on the floor complaining of severe left hip pain, and called EMS. Radiographs of the hip and pelvis demonstrate acute nondisplaced left intertrochanteric femur fracture as well as more subacute appearing nondisplaced fractures of the right superior and inferior pubic rami. Orthopedic surgery was consulted.  She underwent IM fixation of the left hip on 10/9 with Dr. Stann Mainland.  On 10/11, she had an acute episode of left-sided facial droop.  Code stroke was called and neurology consulted.  New events last 24 hours / Subjective: Doing well this morning, complains of thirst.  No complaints of left lower extremity pain.  Assessment & Plan:   Principal Problem:   Closed fracture of left hip (HCC) Active Problems:   Malignant neoplasm of upper-outer quadrant of left breast in female, estrogen receptor positive ([place])   Late onset Alzheimer's disease without behavioral disturbance (HCC)   Hypokalemia   Hypertensive urgency   Prolonged QT interval   Cerebral thrombosis with cerebral infarction   Left hip fracture after fall -Orthopedic surgery following  -Status post IM fixation 10/9 with Dr. Stann Mainland -Pain  control -PT OT evaluation recommending SNF    Left facial droop -Did not appreciate this in previous exams, no history of facial droop in the past per chart review, no history of stroke in the past  -Code stroke called 10/11 -CT head no evidence of acute intracranial abnormality -CTA head and neck no emergent large vessel occlusion -MRI brain no acute intracranial abnormality -Echocardiogram EF 60 to 65%, consistent with impaired relaxation pattern LV diastolic filling -Neurology following -Aspirin -SLP evaluation pending  Prolonged QT interval -EKG reviewed independently today which revealed normal sinus rhythm with QTC 482, which is an improvement from the EKG in the emergency department which revealed QTC 524 -Repeat EKG reviewed independently 10/11, NSR, no significant ST changes, QTc 449    Essential hypertension -Resume lisinopril  Alzheimer's dementia -Continue Namenda, Aricept  Frequent falls -CT head revealed ventriculomegaly.  Concern for NPH.  Review discussed over the phone with neurology, who reviewed imaging.  He recommends outpatient referral to neurosurgery for continued work-up, patient will need a lumbar puncture for definitive diagnosis.  Depression, anxiety -Continue Wellbutrin, Atarax as needed, Zoloft, trazodone nightly  Hypokalemia -Replace, trend   DVT prophylaxis: Lovenox Code Status: Full code Family Communication: Called and discussed with husband over the phone  Disposition Plan: SNF placement pending   Consultants:   Orthopedic surgery  Neurology   Procedures:   None  Antimicrobials:  Anti-infectives (From admission, onward)   Start     Dose/Rate Route Frequency Ordered Stop   01/19/19 1200  ceFAZolin (ANCEF) IVPB 2g/100 mL premix  Status:  Discontinued  2 g 200 mL/hr over 30 Minutes Intravenous To Short Stay 01/19/19 0149 01/19/19 1717   01/19/19 0600  vancomycin (VANCOCIN) IVPB 1000 mg/200 mL premix     1,000 mg 200 mL/hr  over 60 Minutes Intravenous On call to O.R. 01/18/19 2035 01/19/19 1516       Objective: Vitals:   01/22/19 0418 01/22/19 0631 01/22/19 0800 01/22/19 0815  BP: (!) 190/84 (!) 188/80 (!) 166/78 (!) 160/74  Pulse:   97 74  Resp:    16  Temp:      TempSrc:      SpO2:    97%  Weight:      Height:        Intake/Output Summary (Last 24 hours) at 01/22/2019 1000 Last data filed at 01/21/2019 1900 Gross per 24 hour  Intake 0 ml  Output 500 ml  Net -500 ml   Filed Weights   01/19/19 0135 01/19/19 1414  Weight: 45.4 kg 45.4 kg    Examination: General exam: Appears calm and comfortable  Respiratory system: Clear to auscultation. Respiratory effort normal. Cardiovascular system: S1 & S2 heard, RRR. No pedal edema. Gastrointestinal system: Abdomen is nondistended, soft and nontender. Normal bowel sounds heard. Central nervous system: Alert, continued left facial droop  Extremities: Symmetric in appearance bilaterally  Skin: No rashes, lesions or ulcers on exposed skin  Psychiatry: Judgement and insight appear stable. Dementia    Data Reviewed: I have personally reviewed following labs and imaging studies  CBC: Recent Labs  Lab 01/18/19 1645 01/19/19 0236 01/21/19 0233 01/22/19 0655  WBC 9.5 8.3 9.1 7.5  NEUTROABS 8.0* 6.9  --   --   HGB 12.2 11.2* 9.7* 9.9*  HCT 36.4 32.0* 28.5* 30.0*  MCV 89.0 87.0 89.1 91.2  PLT 204 182 193 [id number]   Basic Metabolic Panel: Recent Labs  Lab 01/18/19 1645 01/19/19 0236 01/19/19 1208 01/20/19 0459 01/21/19 0233 01/22/19 0655  NA 138 138  --  136 136 141  K 2.5* 3.2*  --  3.3* 3.5 3.3*  CL 100 101  --  102 103 107  CO2 25 23  --  23 21* 18*  GLUCOSE 126* 122*  --  97 84 77  BUN 11 10  --  13 21 16   CREATININE 0.88 0.96  --  0.95 0.82 0.87  CALCIUM 9.0 9.3  --  9.1 8.9 8.9  MG  --   --  1.6* 2.0 2.0  --    GFR: Estimated Creatinine Clearance: 36.3 mL/min (by C-G formula based on SCr of 0.87 mg/dL). Liver Function  Tests: Recent Labs  Lab 01/18/19 1645 01/19/19 0236  AST 19 24  ALT 13 16  ALKPHOS 124 117  BILITOT 0.8 1.2  PROT 6.7 6.6  ALBUMIN 3.6 3.5   No results for input(s): LIPASE, AMYLASE in the last 168 hours. No results for input(s): AMMONIA in the last 168 hours. Coagulation Profile: Recent Labs  Lab 01/18/19 1645 01/19/19 0236  INR 1.0 1.1   Cardiac Enzymes: No results for input(s): CKTOTAL, CKMB, CKMBINDEX, TROPONINI in the last 168 hours. BNP (last 3 results) No results for input(s): PROBNP in the last 8760 hours. HbA1C: No results for input(s): HGBA1C in the last 72 hours. CBG: Recent Labs  Lab 01/21/19 0842  GLUCAP 82   Lipid Profile: No results for input(s): CHOL, HDL, LDLCALC, TRIG, CHOLHDL, LDLDIRECT in the last 72 hours. Thyroid Function Tests: No results for input(s): TSH, T4TOTAL, FREET4, T3FREE, THYROIDAB in the last 72  hours. Anemia Panel: No results for input(s): VITAMINB12, FOLATE, FERRITIN, TIBC, IRON, RETICCTPCT in the last 72 hours. Sepsis Labs: No results for input(s): PROCALCITON, LATICACIDVEN in the last 168 hours.  Recent Results (from the past 240 hour(s))  SARS Coronavirus 2 by RT PCR (hospital order, performed in [facility] hospital lab) Nasopharyngeal Nasopharyngeal Swab     Status: None   Collection Time: 01/18/19  7:17 PM   Specimen: Nasopharyngeal Swab  Result Value Ref Range Status   SARS Coronavirus 2 NEGATIVE NEGATIVE Final    Comment: (NOTE) If result is NEGATIVE SARS-CoV-2 target nucleic acids are NOT DETECTED. The SARS-CoV-2 RNA is generally detectable in upper and lower  respiratory specimens during the acute phase of infection. The lowest  concentration of SARS-CoV-2 viral copies this assay can detect is 250  copies / mL. A negative result does not preclude SARS-CoV-2 infection  and should not be used as the sole basis for treatment or other  patient management decisions.  A negative result may occur with  improper specimen  collection / handling, submission of specimen other  than nasopharyngeal swab, presence of viral mutation(s) within the  areas targeted by this assay, and inadequate number of viral copies  (<250 copies / mL). A negative result must be combined with clinical  observations, patient history, and epidemiological information. If result is POSITIVE SARS-CoV-2 target nucleic acids are DETECTED. The SARS-CoV-2 RNA is generally detectable in upper and lower  respiratory specimens dur ing the acute phase of infection.  Positive  results are indicative of active infection with SARS-CoV-2.  Clinical  correlation with patient history and other diagnostic information is  necessary to determine patient infection status.  Positive results do  not rule out bacterial infection or co-infection with other viruses. If result is PRESUMPTIVE POSTIVE SARS-CoV-2 nucleic acids MAY BE PRESENT.   A presumptive positive result was obtained on the submitted specimen  and confirmed on repeat testing.  While 2019 novel coronavirus  (SARS-CoV-2) nucleic acids may be present in the submitted sample  additional confirmatory testing may be necessary for epidemiological  and / or clinical management purposes  to differentiate between  SARS-CoV-2 and other Sarbecovirus currently known to infect humans.  If clinically indicated additional testing with an alternate test  methodology [phone]) is advised. The SARS-CoV-2 RNA is generally  detectable in upper and lower respiratory sp ecimens during the acute  phase of infection. The expected result is Negative. Fact Sheet for Patients:  StrictlyIdeas.no Fact Sheet for Healthcare Providers: BankingDealers.co.za This test is not yet approved or cleared by the [place] FDA and has been authorized for detection and/or diagnosis of SARS-CoV-2 by FDA under an Emergency Use Authorization (EUA).  This EUA will remain in effect  (meaning this test can be used) for the duration of the COVID-19 declaration under Section 564(b)(1) of the Act, 21 U.S.C. section 360bbb-3(b)(1), unless the authorization is terminated or revoked sooner. Performed at [facility], [place] [address] [place], [place] [address]   Surgical pcr screen     Status: None   Collection Time: 01/18/19 10:14 PM   Specimen: Nasal Mucosa; Nasal Swab  Result Value Ref Range Status   MRSA, PCR NEGATIVE NEGATIVE Final   Staphylococcus aureus NEGATIVE NEGATIVE Final    Comment: (NOTE) The Xpert SA Assay (FDA approved for NASAL specimens in patients 32 years of age and older), is one component of a comprehensive surveillance program. It is not intended to diagnose infection nor to guide  or monitor treatment. Performed at [facility], [place] [address] [place], [place] [address]       Radiology Studies: Ct Angio Head W Or Wo Contrast  Result Date: 01/21/2019 CLINICAL DATA:  Left-sided weakness and facial droop. EXAM: CT ANGIOGRAPHY HEAD AND NECK CT PERFUSION BRAIN TECHNIQUE: Multidetector CT imaging of the head and neck was performed using the standard protocol during bolus administration of intravenous contrast. Multiplanar CT image reconstructions and MIPs were obtained to evaluate the vascular anatomy. Carotid stenosis measurements (when applicable) are obtained utilizing NASCET criteria, using the distal internal carotid diameter as the denominator. Multiphase CT imaging of the brain was performed following IV bolus contrast injection. Subsequent parametric perfusion maps were calculated using RAPID software. CONTRAST:  175mL OMNIPAQUE IOHEXOL 350 MG/ML SOLN COMPARISON:  None. FINDINGS: CTA NECK FINDINGS Aortic arch: Standard 3 vessel aortic arch with mild atherosclerotic plaque. Calcified plaque at both subclavian artery origins without significant stenosis. Right carotid system: Patent with small volume calcified plaque at the carotid  bifurcation. No evidence of significant stenosis or dissection. Tortuous distal cervical ICA. Left carotid system: Patent with small volume calcified plaque at the carotid bifurcation. No evidence of significant stenosis or dissection. Vertebral arteries: Patent and codominant without evidence of stenosis or dissection. Skeleton: Moderately advanced multilevel cervical facet arthrosis with trace multilevel anterolisthesis. Other neck: No evidence of cervical lymphadenopathy or mass. Upper chest: Minimal scarring in the lung apices. Review of the MIP images confirms the above findings CTA HEAD FINDINGS Anterior circulation: The internal carotid arteries are patent from skull base to carotid termini with mild atherosclerotic plaque bilaterally not resulting in significant stenosis. ACAs and MCAs are patent without evidence of proximal branch occlusion or significant proximal stenosis. No aneurysm is identified. Posterior circulation: The intracranial vertebral arteries are patent to the basilar with minimal nonstenotic plaque bilaterally. A patent right PICA and bilateral SCAis are visualized. AICAs and a left PICA are not clearly identified. The basilar artery is widely patent with a small fenestration incidentally noted proximally. There is slight fusiform enlargement of the distal basilar artery at the PCA and SCA origins without a saccular aneurysm. Posterior communicating arteries are diminutive or absent. Both PCAs are patent with branch vessel irregularity bilaterally. Additionally, there are severe right PCA stenoses involving the P2 and proximal P3 segments, and there is a mild proximal left P2 stenosis. No aneurysm is identified. Venous sinuses: Patent. Anatomic variants: None. Review of the MIP images confirms the above findings CT Brain Perfusion Findings: ASPECTS: 10 CBF (<30%) Volume: 38mL Perfusion (Tmax>6.0s) volume: 56mL Mismatch Volume: n/a Infarction Location: n/a IMPRESSION: 1. No emergent large  vessel occlusion. 2. Intracranial atherosclerosis including severe right P2 and proximal P3 stenoses. 3. Mild cervical carotid artery atherosclerosis without stenosis. 4. Widely patent vertebral arteries. 5. Negative CTP. 6.  Aortic Atherosclerosis (ICD10-I70.0). These results were communicated to Dr. Rory Percy at 9:55 am on 01/21/2019 by text page via the [facility] messaging system. Electronically Signed   By: Logan Bores M.D.   On: 01/21/2019 10:20   Ct Angio Neck W Or Wo Contrast  Result Date: 01/21/2019 CLINICAL DATA:  Left-sided weakness and facial droop. EXAM: CT ANGIOGRAPHY HEAD AND NECK CT PERFUSION BRAIN TECHNIQUE: Multidetector CT imaging of the head and neck was performed using the standard protocol during bolus administration of intravenous contrast. Multiplanar CT image reconstructions and MIPs were obtained to evaluate the vascular anatomy. Carotid stenosis measurements (when applicable) are obtained utilizing NASCET criteria, using the distal internal carotid  diameter as the denominator. Multiphase CT imaging of the brain was performed following IV bolus contrast injection. Subsequent parametric perfusion maps were calculated using RAPID software. CONTRAST:  117mL OMNIPAQUE IOHEXOL 350 MG/ML SOLN COMPARISON:  None. FINDINGS: CTA NECK FINDINGS Aortic arch: Standard 3 vessel aortic arch with mild atherosclerotic plaque. Calcified plaque at both subclavian artery origins without significant stenosis. Right carotid system: Patent with small volume calcified plaque at the carotid bifurcation. No evidence of significant stenosis or dissection. Tortuous distal cervical ICA. Left carotid system: Patent with small volume calcified plaque at the carotid bifurcation. No evidence of significant stenosis or dissection. Vertebral arteries: Patent and codominant without evidence of stenosis or dissection. Skeleton: Moderately advanced multilevel cervical facet arthrosis with trace multilevel anterolisthesis. Other  neck: No evidence of cervical lymphadenopathy or mass. Upper chest: Minimal scarring in the lung apices. Review of the MIP images confirms the above findings CTA HEAD FINDINGS Anterior circulation: The internal carotid arteries are patent from skull base to carotid termini with mild atherosclerotic plaque bilaterally not resulting in significant stenosis. ACAs and MCAs are patent without evidence of proximal branch occlusion or significant proximal stenosis. No aneurysm is identified. Posterior circulation: The intracranial vertebral arteries are patent to the basilar with minimal nonstenotic plaque bilaterally. A patent right PICA and bilateral SCAis are visualized. AICAs and a left PICA are not clearly identified. The basilar artery is widely patent with a small fenestration incidentally noted proximally. There is slight fusiform enlargement of the distal basilar artery at the PCA and SCA origins without a saccular aneurysm. Posterior communicating arteries are diminutive or absent. Both PCAs are patent with branch vessel irregularity bilaterally. Additionally, there are severe right PCA stenoses involving the P2 and proximal P3 segments, and there is a mild proximal left P2 stenosis. No aneurysm is identified. Venous sinuses: Patent. Anatomic variants: None. Review of the MIP images confirms the above findings CT Brain Perfusion Findings: ASPECTS: 10 CBF (<30%) Volume: 63mL Perfusion (Tmax>6.0s) volume: 54mL Mismatch Volume: n/a Infarction Location: n/a IMPRESSION: 1. No emergent large vessel occlusion. 2. Intracranial atherosclerosis including severe right P2 and proximal P3 stenoses. 3. Mild cervical carotid artery atherosclerosis without stenosis. 4. Widely patent vertebral arteries. 5. Negative CTP. 6.  Aortic Atherosclerosis (ICD10-I70.0). These results were communicated to Dr. Rory Percy at 9:55 am on 01/21/2019 by text page via the [facility] messaging system. Electronically Signed   By: Logan Bores M.D.   On:  01/21/2019 10:20   Mr Brain Wo Contrast  Result Date: 01/21/2019 CLINICAL DATA:  Left facial droop. EXAM: MRI HEAD WITHOUT CONTRAST TECHNIQUE: Multiplanar, multiecho pulse sequences of the brain and surrounding structures were obtained without intravenous contrast. COMPARISON:  Head CT, CTA, and CTP 01/21/2019 and MRI 06/26/2012 FINDINGS: The study is mildly motion degraded. Brain: There is no evidence of acute infarct, intracranial hemorrhage, mass, midline shift, or extra-axial fluid collection. Patchy T2 hyperintensities in the cerebral white matter and pons have progressed from 2014 and are nonspecific but compatible with moderate chronic small vessel ischemic disease. Lateral ventriculomegaly, including prominent dilatation of the occipital and temporal horns, has greatly progressed from 2014, and there is milder dilatation of the third and fourth ventricles. Ventricular dilatation is out of proportion to the size of the sulci, and there is mild sulcal crowding at the vertex and narrowing of the callosal angle. Cerebral atrophy is present including severe bilateral mesial temporal lobe atrophy. Vascular: Major intracranial vascular flow voids are preserved. Skull and upper cervical spine: Unremarkable bone marrow signal.  Sinuses/Orbits: Bilateral cataract extraction. Paranasal sinuses and mastoid air cells are clear. Other: None. IMPRESSION: 1. No acute intracranial abnormality. 2. Moderate chronic small vessel ischemic disease and cerebral atrophy. 3. Progressive ventriculomegaly since 2014 which could reflect normal pressure hydrocephalus in the appropriate clinical setting. Electronically Signed   By: Logan Bores M.D.   On: 01/21/2019 14:34   Ct Cerebral Perfusion W Contrast  Result Date: 01/21/2019 CLINICAL DATA:  Left-sided weakness and facial droop. EXAM: CT ANGIOGRAPHY HEAD AND NECK CT PERFUSION BRAIN TECHNIQUE: Multidetector CT imaging of the head and neck was performed using the standard  protocol during bolus administration of intravenous contrast. Multiplanar CT image reconstructions and MIPs were obtained to evaluate the vascular anatomy. Carotid stenosis measurements (when applicable) are obtained utilizing NASCET criteria, using the distal internal carotid diameter as the denominator. Multiphase CT imaging of the brain was performed following IV bolus contrast injection. Subsequent parametric perfusion maps were calculated using RAPID software. CONTRAST:  148mL OMNIPAQUE IOHEXOL 350 MG/ML SOLN COMPARISON:  None. FINDINGS: CTA NECK FINDINGS Aortic arch: Standard 3 vessel aortic arch with mild atherosclerotic plaque. Calcified plaque at both subclavian artery origins without significant stenosis. Right carotid system: Patent with small volume calcified plaque at the carotid bifurcation. No evidence of significant stenosis or dissection. Tortuous distal cervical ICA. Left carotid system: Patent with small volume calcified plaque at the carotid bifurcation. No evidence of significant stenosis or dissection. Vertebral arteries: Patent and codominant without evidence of stenosis or dissection. Skeleton: Moderately advanced multilevel cervical facet arthrosis with trace multilevel anterolisthesis. Other neck: No evidence of cervical lymphadenopathy or mass. Upper chest: Minimal scarring in the lung apices. Review of the MIP images confirms the above findings CTA HEAD FINDINGS Anterior circulation: The internal carotid arteries are patent from skull base to carotid termini with mild atherosclerotic plaque bilaterally not resulting in significant stenosis. ACAs and MCAs are patent without evidence of proximal branch occlusion or significant proximal stenosis. No aneurysm is identified. Posterior circulation: The intracranial vertebral arteries are patent to the basilar with minimal nonstenotic plaque bilaterally. A patent right PICA and bilateral SCAis are visualized. AICAs and a left PICA are not  clearly identified. The basilar artery is widely patent with a small fenestration incidentally noted proximally. There is slight fusiform enlargement of the distal basilar artery at the PCA and SCA origins without a saccular aneurysm. Posterior communicating arteries are diminutive or absent. Both PCAs are patent with branch vessel irregularity bilaterally. Additionally, there are severe right PCA stenoses involving the P2 and proximal P3 segments, and there is a mild proximal left P2 stenosis. No aneurysm is identified. Venous sinuses: Patent. Anatomic variants: None. Review of the MIP images confirms the above findings CT Brain Perfusion Findings: ASPECTS: 10 CBF (<30%) Volume: 63mL Perfusion (Tmax>6.0s) volume: 62mL Mismatch Volume: n/a Infarction Location: n/a IMPRESSION: 1. No emergent large vessel occlusion. 2. Intracranial atherosclerosis including severe right P2 and proximal P3 stenoses. 3. Mild cervical carotid artery atherosclerosis without stenosis. 4. Widely patent vertebral arteries. 5. Negative CTP. 6.  Aortic Atherosclerosis (ICD10-I70.0). These results were communicated to Dr. Rory Percy at 9:55 am on 01/21/2019 by text page via the [facility] messaging system. Electronically Signed   By: Logan Bores M.D.   On: 01/21/2019 10:20   Ct Head Code Stroke Wo Contrast  Result Date: 01/21/2019 CLINICAL DATA:  Code stroke.  Left-sided weakness and facial droop. EXAM: CT HEAD WITHOUT CONTRAST TECHNIQUE: Contiguous axial images were obtained from the base of the skull through the vertex  without intravenous contrast. COMPARISON:  01/18/2019 FINDINGS: Brain: There is no evidence of acute infarct, intracranial hemorrhage, mass, midline shift, or extra-axial fluid collection. Moderate dilatation of the lateral ventricles, including of the temporal horns, is unchanged and out of proportion to the size of the sulci. There is sulcal effacement at the vertex, and there is narrowing of the callososeptal angle. Slight  enlargement of the third and fourth ventricles is also unchanged. Patchy hypodensities in the cerebral white matter bilaterally are unchanged and nonspecific but compatible with moderate chronic small vessel ischemic disease. A punctate calcification is again seen in the right parietal lobe. Vascular: Calcified atherosclerosis at the skull base. No hyperdense vessel. Skull: No fracture or focal osseous lesion. Sinuses/Orbits: Visualized paranasal sinuses and mastoid air cells are clear. Bilateral cataract extraction is noted. Other: None. ASPECTS [facility] Stroke Program Early CT Score) - Ganglionic level infarction (caudate, lentiform nuclei, internal capsule, insula, M1-M3 cortex): 7 - Supraganglionic infarction (M4-M6 cortex): 3 Total score (0-10 with 10 being normal): 10 IMPRESSION: 1. No evidence of acute intracranial abnormality. 2. ASPECTS is 10. 3. Unchanged hydrocephalus. 4. Moderate chronic small vessel ischemic disease. These results were communicated to Dr. Rory Percy at 9:11 am on 01/21/2019 by text page via the [facility] messaging system. Electronically Signed   By: Logan Bores M.D.   On: 01/21/2019 09:12      Scheduled Meds:  aspirin  81 mg Oral QHS   buPROPion  150 mg Oral Daily   docusate sodium  100 mg Oral BID   donepezil  5 mg Oral QHS   enoxaparin (LOVENOX) injection  40 mg Subcutaneous Q24H   feeding supplement (ENSURE ENLIVE)  237 mL Oral BID BM   fluticasone  1 spray Each Nare BID   megestrol  20 mg Oral Daily   memantine  10 mg Oral Daily   pantoprazole  40 mg Oral Daily   sertraline  100 mg Oral Daily   timolol  1 drop Both Eyes Daily   traZODone  50 mg Oral QHS   Continuous Infusions:  lactated ringers 50 mL/hr at 01/22/19 0847   methocarbamol (ROBAXIN) IV     potassium chloride 10 mEq (01/22/19 0854)     LOS: 4 days      Time spent: 25 minutes   Dessa Phi, DO [facility] 01/22/2019, 10:00 AM   Available via Epic secure chat  7am-7pm After these hours, please refer to coverage provider listed on [email or web address]

## 2019-01-22 NOTE — Progress Notes (Signed)
STROKE TEAM PROGRESS NOTE   INTERVAL HISTORY I have personally reviewed history of presenting illness with the patient.  She is a poor historian given baseline dementia.  I reviewed electronic medical records as well as imaging films in PACS.  She was found to have transient facial droop following recent hip surgery which appears to have resolved.  MRI scan shows no evidence of acute stroke but slight enlarged ventricular size compared to previous brain imaging.  CT angiogram shows moderate stenosis in the P2 and P3 segment of the right posterior cerebral arteries.  Vitals:   01/22/19 0418 01/22/19 0631 01/22/19 0800 01/22/19 0815  BP: (!) 190/84 (!) 188/80 (!) 166/78 (!) 160/74  Pulse:   97 74  Resp:    16  Temp:      TempSrc:      SpO2:    97%  Weight:      Height:        CBC:  Recent Labs  Lab 01/18/19 1645 01/19/19 0236 01/21/19 0233 01/22/19 0655  WBC 9.5 8.3 9.1 7.5  NEUTROABS 8.0* 6.9  --   --   HGB 12.2 11.2* 9.7* 9.9*  HCT 36.4 32.0* 28.5* 30.0*  MCV 89.0 87.0 89.1 91.2  PLT 204 182 193 [id number]    Basic Metabolic Panel:  Recent Labs  Lab 01/20/19 0459 01/21/19 0233 01/22/19 0655  NA 136 136 141  K 3.3* 3.5 3.3*  CL 102 103 107  CO2 23 21* 18*  GLUCOSE 97 84 77  BUN 13 21 16   CREATININE 0.95 0.82 0.87  CALCIUM 9.1 8.9 8.9  MG 2.0 2.0  --     IMAGING Ct Angio Head W Or Wo Contrast  Result Date: 01/21/2019 CLINICAL DATA:  Left-sided weakness and facial droop. EXAM: CT ANGIOGRAPHY HEAD AND NECK CT PERFUSION BRAIN TECHNIQUE: Multidetector CT imaging of the head and neck was performed using the standard protocol during bolus administration of intravenous contrast. Multiplanar CT image reconstructions and MIPs were obtained to evaluate the vascular anatomy. Carotid stenosis measurements (when applicable) are obtained utilizing NASCET criteria, using the distal internal carotid diameter as the denominator. Multiphase CT imaging of the brain was performed following IV  bolus contrast injection. Subsequent parametric perfusion maps were calculated using RAPID software. CONTRAST:  169mL OMNIPAQUE IOHEXOL 350 MG/ML SOLN COMPARISON:  None. FINDINGS: CTA NECK FINDINGS Aortic arch: Standard 3 vessel aortic arch with mild atherosclerotic plaque. Calcified plaque at both subclavian artery origins without significant stenosis. Right carotid system: Patent with small volume calcified plaque at the carotid bifurcation. No evidence of significant stenosis or dissection. Tortuous distal cervical ICA. Left carotid system: Patent with small volume calcified plaque at the carotid bifurcation. No evidence of significant stenosis or dissection. Vertebral arteries: Patent and codominant without evidence of stenosis or dissection. Skeleton: Moderately advanced multilevel cervical facet arthrosis with trace multilevel anterolisthesis. Other neck: No evidence of cervical lymphadenopathy or mass. Upper chest: Minimal scarring in the lung apices. Review of the MIP images confirms the above findings CTA HEAD FINDINGS Anterior circulation: The internal carotid arteries are patent from skull base to carotid termini with mild atherosclerotic plaque bilaterally not resulting in significant stenosis. ACAs and MCAs are patent without evidence of proximal branch occlusion or significant proximal stenosis. No aneurysm is identified. Posterior circulation: The intracranial vertebral arteries are patent to the basilar with minimal nonstenotic plaque bilaterally. A patent right PICA and bilateral SCAis are visualized. AICAs and a left PICA are not clearly identified. The basilar artery  is widely patent with a small fenestration incidentally noted proximally. There is slight fusiform enlargement of the distal basilar artery at the PCA and SCA origins without a saccular aneurysm. Posterior communicating arteries are diminutive or absent. Both PCAs are patent with branch vessel irregularity bilaterally. Additionally,  there are severe right PCA stenoses involving the P2 and proximal P3 segments, and there is a mild proximal left P2 stenosis. No aneurysm is identified. Venous sinuses: Patent. Anatomic variants: None. Review of the MIP images confirms the above findings CT Brain Perfusion Findings: ASPECTS: 10 CBF (<30%) Volume: 41mL Perfusion (Tmax>6.0s) volume: 35mL Mismatch Volume: n/a Infarction Location: n/a IMPRESSION: 1. No emergent large vessel occlusion. 2. Intracranial atherosclerosis including severe right P2 and proximal P3 stenoses. 3. Mild cervical carotid artery atherosclerosis without stenosis. 4. Widely patent vertebral arteries. 5. Negative CTP. 6.  Aortic Atherosclerosis (ICD10-I70.0). These results were communicated to Dr. Rory Percy at 9:55 am on 01/21/2019 by text page via the [facility] messaging system. Electronically Signed   By: Logan Bores M.D.   On: 01/21/2019 10:20   Ct Angio Neck W Or Wo Contrast  Result Date: 01/21/2019 CLINICAL DATA:  Left-sided weakness and facial droop. EXAM: CT ANGIOGRAPHY HEAD AND NECK CT PERFUSION BRAIN TECHNIQUE: Multidetector CT imaging of the head and neck was performed using the standard protocol during bolus administration of intravenous contrast. Multiplanar CT image reconstructions and MIPs were obtained to evaluate the vascular anatomy. Carotid stenosis measurements (when applicable) are obtained utilizing NASCET criteria, using the distal internal carotid diameter as the denominator. Multiphase CT imaging of the brain was performed following IV bolus contrast injection. Subsequent parametric perfusion maps were calculated using RAPID software. CONTRAST:  175mL OMNIPAQUE IOHEXOL 350 MG/ML SOLN COMPARISON:  None. FINDINGS: CTA NECK FINDINGS Aortic arch: Standard 3 vessel aortic arch with mild atherosclerotic plaque. Calcified plaque at both subclavian artery origins without significant stenosis. Right carotid system: Patent with small volume calcified plaque at the carotid  bifurcation. No evidence of significant stenosis or dissection. Tortuous distal cervical ICA. Left carotid system: Patent with small volume calcified plaque at the carotid bifurcation. No evidence of significant stenosis or dissection. Vertebral arteries: Patent and codominant without evidence of stenosis or dissection. Skeleton: Moderately advanced multilevel cervical facet arthrosis with trace multilevel anterolisthesis. Other neck: No evidence of cervical lymphadenopathy or mass. Upper chest: Minimal scarring in the lung apices. Review of the MIP images confirms the above findings CTA HEAD FINDINGS Anterior circulation: The internal carotid arteries are patent from skull base to carotid termini with mild atherosclerotic plaque bilaterally not resulting in significant stenosis. ACAs and MCAs are patent without evidence of proximal branch occlusion or significant proximal stenosis. No aneurysm is identified. Posterior circulation: The intracranial vertebral arteries are patent to the basilar with minimal nonstenotic plaque bilaterally. A patent right PICA and bilateral SCAis are visualized. AICAs and a left PICA are not clearly identified. The basilar artery is widely patent with a small fenestration incidentally noted proximally. There is slight fusiform enlargement of the distal basilar artery at the PCA and SCA origins without a saccular aneurysm. Posterior communicating arteries are diminutive or absent. Both PCAs are patent with branch vessel irregularity bilaterally. Additionally, there are severe right PCA stenoses involving the P2 and proximal P3 segments, and there is a mild proximal left P2 stenosis. No aneurysm is identified. Venous sinuses: Patent. Anatomic variants: None. Review of the MIP images confirms the above findings CT Brain Perfusion Findings: ASPECTS: 10 CBF (<30%) Volume: 82mL Perfusion (Tmax>6.0s) volume:  60mL Mismatch Volume: n/a Infarction Location: n/a IMPRESSION: 1. No emergent large  vessel occlusion. 2. Intracranial atherosclerosis including severe right P2 and proximal P3 stenoses. 3. Mild cervical carotid artery atherosclerosis without stenosis. 4. Widely patent vertebral arteries. 5. Negative CTP. 6.  Aortic Atherosclerosis (ICD10-I70.0). These results were communicated to Dr. Rory Percy at 9:55 am on 01/21/2019 by text page via the [facility] messaging system. Electronically Signed   By: Logan Bores M.D.   On: 01/21/2019 10:20   Mr Brain Wo Contrast  Result Date: 01/21/2019 CLINICAL DATA:  Left facial droop. EXAM: MRI HEAD WITHOUT CONTRAST TECHNIQUE: Multiplanar, multiecho pulse sequences of the brain and surrounding structures were obtained without intravenous contrast. COMPARISON:  Head CT, CTA, and CTP 01/21/2019 and MRI 06/26/2012 FINDINGS: The study is mildly motion degraded. Brain: There is no evidence of acute infarct, intracranial hemorrhage, mass, midline shift, or extra-axial fluid collection. Patchy T2 hyperintensities in the cerebral white matter and pons have progressed from 2014 and are nonspecific but compatible with moderate chronic small vessel ischemic disease. Lateral ventriculomegaly, including prominent dilatation of the occipital and temporal horns, has greatly progressed from 2014, and there is milder dilatation of the third and fourth ventricles. Ventricular dilatation is out of proportion to the size of the sulci, and there is mild sulcal crowding at the vertex and narrowing of the callosal angle. Cerebral atrophy is present including severe bilateral mesial temporal lobe atrophy. Vascular: Major intracranial vascular flow voids are preserved. Skull and upper cervical spine: Unremarkable bone marrow signal. Sinuses/Orbits: Bilateral cataract extraction. Paranasal sinuses and mastoid air cells are clear. Other: None. IMPRESSION: 1. No acute intracranial abnormality. 2. Moderate chronic small vessel ischemic disease and cerebral atrophy. 3. Progressive ventriculomegaly  since 2014 which could reflect normal pressure hydrocephalus in the appropriate clinical setting. Electronically Signed   By: Logan Bores M.D.   On: 01/21/2019 14:34   Ct Cerebral Perfusion W Contrast  Result Date: 01/21/2019 CLINICAL DATA:  Left-sided weakness and facial droop. EXAM: CT ANGIOGRAPHY HEAD AND NECK CT PERFUSION BRAIN TECHNIQUE: Multidetector CT imaging of the head and neck was performed using the standard protocol during bolus administration of intravenous contrast. Multiplanar CT image reconstructions and MIPs were obtained to evaluate the vascular anatomy. Carotid stenosis measurements (when applicable) are obtained utilizing NASCET criteria, using the distal internal carotid diameter as the denominator. Multiphase CT imaging of the brain was performed following IV bolus contrast injection. Subsequent parametric perfusion maps were calculated using RAPID software. CONTRAST:  179mL OMNIPAQUE IOHEXOL 350 MG/ML SOLN COMPARISON:  None. FINDINGS: CTA NECK FINDINGS Aortic arch: Standard 3 vessel aortic arch with mild atherosclerotic plaque. Calcified plaque at both subclavian artery origins without significant stenosis. Right carotid system: Patent with small volume calcified plaque at the carotid bifurcation. No evidence of significant stenosis or dissection. Tortuous distal cervical ICA. Left carotid system: Patent with small volume calcified plaque at the carotid bifurcation. No evidence of significant stenosis or dissection. Vertebral arteries: Patent and codominant without evidence of stenosis or dissection. Skeleton: Moderately advanced multilevel cervical facet arthrosis with trace multilevel anterolisthesis. Other neck: No evidence of cervical lymphadenopathy or mass. Upper chest: Minimal scarring in the lung apices. Review of the MIP images confirms the above findings CTA HEAD FINDINGS Anterior circulation: The internal carotid arteries are patent from skull base to carotid termini with  mild atherosclerotic plaque bilaterally not resulting in significant stenosis. ACAs and MCAs are patent without evidence of proximal branch occlusion or significant proximal stenosis. No aneurysm is identified.  Posterior circulation: The intracranial vertebral arteries are patent to the basilar with minimal nonstenotic plaque bilaterally. A patent right PICA and bilateral SCAis are visualized. AICAs and a left PICA are not clearly identified. The basilar artery is widely patent with a small fenestration incidentally noted proximally. There is slight fusiform enlargement of the distal basilar artery at the PCA and SCA origins without a saccular aneurysm. Posterior communicating arteries are diminutive or absent. Both PCAs are patent with branch vessel irregularity bilaterally. Additionally, there are severe right PCA stenoses involving the P2 and proximal P3 segments, and there is a mild proximal left P2 stenosis. No aneurysm is identified. Venous sinuses: Patent. Anatomic variants: None. Review of the MIP images confirms the above findings CT Brain Perfusion Findings: ASPECTS: 10 CBF (<30%) Volume: 84mL Perfusion (Tmax>6.0s) volume: 68mL Mismatch Volume: n/a Infarction Location: n/a IMPRESSION: 1. No emergent large vessel occlusion. 2. Intracranial atherosclerosis including severe right P2 and proximal P3 stenoses. 3. Mild cervical carotid artery atherosclerosis without stenosis. 4. Widely patent vertebral arteries. 5. Negative CTP. 6.  Aortic Atherosclerosis (ICD10-I70.0). These results were communicated to Dr. Rory Percy at 9:55 am on 01/21/2019 by text page via the [facility] messaging system. Electronically Signed   By: Logan Bores M.D.   On: 01/21/2019 10:20   Ct Head Code Stroke Wo Contrast  Result Date: 01/21/2019 CLINICAL DATA:  Code stroke.  Left-sided weakness and facial droop. EXAM: CT HEAD WITHOUT CONTRAST TECHNIQUE: Contiguous axial images were obtained from the base of the skull through the vertex without  intravenous contrast. COMPARISON:  01/18/2019 FINDINGS: Brain: There is no evidence of acute infarct, intracranial hemorrhage, mass, midline shift, or extra-axial fluid collection. Moderate dilatation of the lateral ventricles, including of the temporal horns, is unchanged and out of proportion to the size of the sulci. There is sulcal effacement at the vertex, and there is narrowing of the callososeptal angle. Slight enlargement of the third and fourth ventricles is also unchanged. Patchy hypodensities in the cerebral white matter bilaterally are unchanged and nonspecific but compatible with moderate chronic small vessel ischemic disease. A punctate calcification is again seen in the right parietal lobe. Vascular: Calcified atherosclerosis at the skull base. No hyperdense vessel. Skull: No fracture or focal osseous lesion. Sinuses/Orbits: Visualized paranasal sinuses and mastoid air cells are clear. Bilateral cataract extraction is noted. Other: None. ASPECTS [facility] Stroke Program Early CT Score) - Ganglionic level infarction (caudate, lentiform nuclei, internal capsule, insula, M1-M3 cortex): 7 - Supraganglionic infarction (M4-M6 cortex): 3 Total score (0-10 with 10 being normal): 10 IMPRESSION: 1. No evidence of acute intracranial abnormality. 2. ASPECTS is 10. 3. Unchanged hydrocephalus. 4. Moderate chronic small vessel ischemic disease. These results were communicated to Dr. Rory Percy at 9:11 am on 01/21/2019 by text page via the [facility] messaging system. Electronically Signed   By: Logan Bores M.D.   On: 01/21/2019 09:12    PHYSICAL EXAM Frail elderly Caucasian lady currently not in distress. . Afebrile. Head is nontraumatic. Neck is supple without bruit.    Cardiac exam no murmur or gallop. Lungs are clear to auscultation.  Patient is hard of hearing distal pulses are well felt. Neurological Exam ;  Awake  Alert oriented x 0.  Poor insight, diminished attention, registration and recall.. Normal speech and  language.eye movements full without nystagmus.fundi were not visualized. Vision acuity and fields appear normal. Hearing is normal. Palatal movements are normal. Face asymmetric with slightly deeper nasolabial fold on the right compared to the left.. Tongue midline. Normal strength,  tone, reflexes and coordination. Normal sensation. Gait deferred.  ASSESSMENT/PLAN Ms. Shelly Cervantes is a 83 y.o. female with history of Alzheimer's dementia, breast cancer status post definitive surgery, hypertension, and anxiety presenting to [place]. [facility] with hip pain following a fall s/p ORIF L hip 10/9 post op was noticed to have L sided facial weakness and "not acting right".   Facial droop, chronic (no stroke no TIA)  Code Stroke CT head No acute abnormality. Unchanged hydrocephalus. Moderate Small vessel disease. ASPECTS 10.     CTA head & neck no ELVO. Severe R P2 and proximal P3 stenosis. Mild cervical ICA w/o stenosis. Aortic atherosclerosis.  CT perfusion negative   MRI  No acute abnormality. 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 Moderate small vessel disease and atrophy.   2D Echo EF 60-65%. No source of embolus   Lovenox 40 mg sq daily for VTE prophylaxis  aspirin 81 mg daily prior to admission, now on aspirin 81 mg daily.   Hypertension  Hyperlipidemia  Home meds:  Megace  Other Stroke Risk Factors  Advanced age  Other Active Problems  L hip pain following a fall s/p ORIF L hip 10/9  Alzheimer's disease  Hx L breast cancer  GERD  Depression  NOTHING FURTHER TO ADD FROM THE STROKE STANDPOINT  Stroke Service will sign off. Please call should any needs arise.  [place] for [facility] if needed from [facility] day # 4  I have personally obtained history,examined this patient, reviewed notes, independently viewed imaging studies, participated in medical decision making and plan of care.ROS completed by me personally and pertinent positives  fully documented  I have made any additions or clarifications directly to the above note.  Patient is a poor historian due to her baseline dementia.  No clear-cut focal stroke symptoms are documented except facial droop but looking at the patient's facial face it appears to be chronic.  Recommend aspirin 81 mg daily and no further stroke work-up is necessary.  Discussed with Dr. Maylene Roes.  Greater than 50% time during this 25-minute visit was spent on counseling and coordination of care and discussion with care team.  Stroke team will sign off.  Kindly call for questions if any.  Antony Contras, MD Medical Director [facility] Pager: [phone] 01/22/2019 3:53 PM   To contact Stroke Continuity provider, please refer to [email or web address] After hours, contact General Neurology

## 2019-01-23 LAB — BASIC METABOLIC PANEL
Anion gap: 11 (ref 5–15)
BUN: 12 mg/dL (ref 8–23)
CO2: 24 mmol/L (ref 22–32)
Calcium: 8.9 mg/dL (ref 8.9–10.3)
Chloride: 101 mmol/L (ref 98–111)
Creatinine, Ser: 0.74 mg/dL (ref 0.44–1.00)
GFR calc Af Amer: >60 mL/min (ref >60)
GFR calc non Af Amer: >60 mL/min (ref >60)
Glucose, Bld: 100 mg/dL — ABNORMAL HIGH (ref 70–99)
Potassium: 3.8 mmol/L (ref 3.5–5.1)
Sodium: 136 mmol/L (ref 135–145)

## 2019-01-23 LAB — MAGNESIUM: Magnesium: 1.7 mg/dL (ref 1.7–2.4)

## 2019-01-23 MED ORDER — LABETALOL HCL 5 MG/ML IV SOLN
5.0000 mg | Freq: Once | INTRAVENOUS | Status: AC
  Administered 2019-01-23: 5 mg via INTRAVENOUS
  Filled 2019-01-23: qty 4

## 2019-01-23 MED ORDER — LISINOPRIL 20 MG PO TABS
40.0000 mg | ORAL_TABLET | Freq: Every day | ORAL | Status: DC
  Administered 2019-01-23 – 2019-01-27 (×5): 40 mg via ORAL
  Filled 2019-01-23 (×5): qty 2

## 2019-01-23 MED ORDER — LABETALOL HCL 5 MG/ML IV SOLN
10.0000 mg | INTRAVENOUS | Status: AC | PRN
  Administered 2019-01-23 (×2): 10 mg via INTRAVENOUS
  Filled 2019-01-23 (×2): qty 4

## 2019-01-23 MED ORDER — AMLODIPINE BESYLATE 5 MG PO TABS
5.0000 mg | ORAL_TABLET | Freq: Every day | ORAL | Status: DC
  Administered 2019-01-23 – 2019-01-27 (×5): 5 mg via ORAL
  Filled 2019-01-23 (×5): qty 1

## 2019-01-23 NOTE — Clinical Social Work Note (Signed)
CSW talked with patient's daughter, [name] on 10/12 regarding placement for patient. Daughter informed that [facility] (patient from [place], however the SNF does not take [employer]). Daughter indicated that she wants a facility that can meet her needs due to her mother's Alzheimer's dementia. Daughter expressed an interest in [place], [place], [place], [place], and [facility].   10/13 (8:43 am) - Call made to [facility] regarding patient. [place] takes [facility] and she will review Ms. Lindsley's information and let CSW know.  A CSW will continue to follow and facilitate discharge to a skilled facility pending bed availability and insurance authorization.  Makynli Stills [place]ens, MSW, LCSW Licensed Clinical Social Worker [place] [phone]

## 2019-01-23 NOTE — Progress Notes (Signed)
Physical Therapy Treatment Patient Details Name: Shelly Cervantes MRN: [id number] DOB: 1935/07/21 Today's Date: 01/23/2019    History of Present Illness Shelly Cervantes is a 83 y.o. female with medical history significant for Alzheimer's dementia, breast cancer status post definitive surgery, hypertension, and anxiety, now presenting to the emergency department for evaluation of left hip pain after a fall.  Per husband report, patient has had recent gait difficulty with frequent falls, reporting that this is the fourth fall she has had in the past few weeks.  pt had IM nailing 01-19-2019    PT Comments    Continuing work on functional mobility and activity tolerance;  Able to initiate walking with +2 assist; manual facilitation of weight shift onto stance (R and LLEs, with more difficulty in L stance due to pain with weight bearing) to allow for stepping; Better participation today, and she seemed pleased to be able to take steps; Second person to push chair behind pt for safety  Follow Up Recommendations  SNF;Supervision/Assistance - 24 hour     Equipment Recommendations  None recommended by PT    Recommendations for Other Services       Precautions / Restrictions Precautions Precautions: Fall Restrictions LLE Weight Bearing: Weight bearing as tolerated    Mobility  Bed Mobility                  Transfers Overall transfer level: Needs assistance Equipment used: Rolling walker (2 wheeled) Transfers: Sit to/from Stand Sit to Stand: Mod assist         General transfer comment: Heavy mod assist to power up; cues for hand placement -- very hesitant to let go of chair and switch hands to RW  Ambulation/Gait Ambulation/Gait assistance: Mod assist;+2 safety/equipment Gait Distance (Feet): 5 Feet Assistive device: Rolling walker (2 wheeled) Gait Pattern/deviations: Antalgic;Narrow base of support     General Gait Details: Cues for sequence and encouragement to walk; Manual  facilitation of weight shift onto stance leg; extra support given L side at gait belt and axilla during L stance to allow fo runweighing RLE to advance; chair pushed behind for safety   Stairs             Wheelchair Mobility    Modified Rankin (Stroke Patients Only)       Balance     Sitting balance-Leahy Scale: Poor(approaching Fair) Sitting balance - Comments: able to sit unsupported at edge of chair                                    Cognition Arousal/Alertness: Awake/alert Behavior During Therapy: WFL for tasks assessed/performed;Flat affect Overall Cognitive Status: History of cognitive impairments - at baseline                                        Exercises Other Exercises Other Exercises: agreeable to perform bil simultaneous long arc quads once sitting in chair    General Comments        Pertinent Vitals/Pain Pain Assessment: Faces Faces Pain Scale: Hurts a little bit Pain Location: L hip Pain Descriptors / Indicators: Grimacing Pain Intervention(s): Monitored during session;Repositioned    Home Living                      Prior Function  PT Goals (current goals can now be found in the care plan section) Acute Rehab PT Goals Patient Stated Goal: husband wants pt progressed to memory care unit PT Goal Formulation: With family Time For Goal Achievement: 01/27/19 Potential to Achieve Goals: Fair Progress towards PT goals: Progressing toward goals    Frequency    Min 2X/week      PT Plan Current plan remains appropriate;Frequency needs to be updated    Co-evaluation              AM-PAC PT "6 Clicks" Mobility   Outcome Measure  Help needed turning from your back to your side while in a flat bed without using bedrails?: A Lot Help needed moving from lying on your back to sitting on the side of a flat bed without using bedrails?: A Lot Help needed moving to and from a bed to a  chair (including a wheelchair)?: A Lot Help needed standing up from a chair using your arms (e.g., wheelchair or bedside chair)?: A Lot Help needed to walk in hospital room?: A Lot Help needed climbing 3-5 steps with a railing? : Total 6 Click Score: 11    End of Session Equipment Utilized During Treatment: Gait belt Activity Tolerance: Patient tolerated treatment well Patient left: in chair;with chair alarm set;with family/visitor present;with call bell/phone within reach Nurse Communication: Mobility status PT Visit Diagnosis: Other abnormalities of gait and mobility (R26.89);Repeated falls (R29.6);History of falling (Z91.81);Difficulty in walking, not elsewhere classified (R26.2);Pain Pain - Right/Left: Left Pain - part of body: Hip     Time: [id number] PT Time Calculation (min) (ACUTE ONLY): 16 min  Charges:  $Gait Training: 8-22 mins                     Shelly Cervantes, [place] Pager [phone] Office [place] 01/23/2019, 1:15 PM

## 2019-01-23 NOTE — Progress Notes (Signed)
Called about pt with elevated MEWS d/t hypertension. Md notified and Meds ordered. Continue frequent vitals per guidelines below.    Vital Signs MEWS/VS Documentation      01/22/2019 1952 01/22/2019 2319 01/22/2019 2331 01/23/2019 0356   MEWS Score:  0  0  0  2   MEWS Score Color:  Green  Green  Green  Yellow   Resp:  15  -  -  15   Pulse:  72  -  76  70   BP:  (!) 183/86  -  (!) 189/82  (!) 201/76   Temp:  98.4 F (36.9 C)  -  -  98 F (36.7 C)   O2 Device:  Room Air  -  -  Room Air   Level of Consciousness:  -  Alert  -  -        Sherilyn Dacosta 01/23/2019,4:47 AM MEWS Guidelines - (patients age 95 and over)  Red - At High Risk for Deterioration Yellow - At risk for Deterioration  1. Go to room and assess patient 2. Validate data. Is this patient's baseline? If data confirmed: 3. Is this an acute change? 4. Administer prn meds/treatments as ordered. 5. Note Sepsis score 6. Review goals of care 7. Sports coach, RRT nurse and Provider. 8. Ask Provider to come to bedside.  9. Document patient condition/interventions/response. 10. Increase frequency of vital signs and focused assessments to at least q15 minutes x 4, then q30 minutes x2. - If stable, then q1h x3, then q4h x3 and then q8h or dept. routine. - If unstable, contact Provider & RRT nurse. Prepare for possible transfer. 11. Add entry in progress notes using the smart phrase ".MEWS". 1. Go to room and assess patient 2. Validate data. Is this patient's baseline? If data confirmed: 3. Is this an acute change? 4. Administer prn meds/treatments as ordered? 5. Note Sepsis score 6. Review goals of care 7. Sports coach and Provider 8. Call RRT nurse as needed. 9. Document patient condition/interventions/response. 10. Increase frequency of vital signs and focused assessments to at least q2h x2. - If stable, then q4h x2 and then q8h or dept. routine. - If unstable, contact Provider & RRT nurse.  Prepare for possible transfer. 11. Add entry in progress notes using the smart phrase ".MEWS".  Green - Likely stable Lavender - Comfort Care Only  1. Continue routine/ordered monitoring.  2. Review goals of care. 1. Continue routine/ordered monitoring. 2. Review goals of care.

## 2019-01-23 NOTE — Progress Notes (Addendum)
Patient has BP 189/82 notified on call for [facility] .patient denies having any symptoms at this time , denies having any pain.

## 2019-01-23 NOTE — Progress Notes (Signed)
PROGRESS NOTE    Shelly Cervantes  [id number] DOB: 1936/02/09 DOA: 01/18/2019 PCP: [name], MD     Brief Narrative:  Shelly Cervantes is a 83 y.o. female with medical history significant for Alzheimer's dementia, breast cancer status post definitive surgery, hypertension, and anxiety, now presenting to the emergency department for evaluation of left hip pain after a fall.  Per husband report, patient has had recent gait difficulty with frequent falls, reporting that this is the fourth fall she has had in the past few weeks.  She had not been complaining of anything leading up to this and seemed to be having an uneventful day.  Patient denies losing consciousness or experiencing any chest pain or lightheadedness prior to the fall.  Her husband heard her yell from another room, found her on the floor complaining of severe left hip pain, and called EMS. Radiographs of the hip and pelvis demonstrate acute nondisplaced left intertrochanteric femur fracture as well as more subacute appearing nondisplaced fractures of the right superior and inferior pubic rami. Orthopedic surgery was consulted.  She underwent IM fixation of the left hip on 10/9 with Dr. Stann Mainland.  On 10/11, she had an acute episode of left-sided facial droop.  Code stroke was called and neurology consulted.  New events last 24 hours / Subjective: Pleasantly confused, unaware that she is here for a hip fracture and had hip surgery.  No acute physical complaints today  Assessment & Plan:   Principal Problem:   Closed fracture of left hip (HCC) Active Problems:   Malignant neoplasm of upper-outer quadrant of left breast in female, estrogen receptor positive ([place])   Late onset Alzheimer's disease without behavioral disturbance (HCC)   Hypokalemia   Hypertensive urgency   Prolonged QT interval   Cerebral thrombosis with cerebral infarction   Left hip fracture after fall -Orthopedic surgery following  -Status post IM fixation  10/9 with Dr. Stann Mainland -Pain control -PT OT evaluation recommending SNF    Left facial droop -Did not appreciate this in previous exams, no history of facial droop in the past per chart review, no history of stroke in the past  -Code stroke called 10/11 -CT head no evidence of acute intracranial abnormality -CTA head and neck no emergent large vessel occlusion -MRI brain no acute intracranial abnormality -Echocardiogram EF 60 to 65%, consistent with impaired relaxation pattern LV diastolic filling -Neurology signed off, did not feel this was indicative of stroke nor TIA -Aspirin daily  Prolonged QT interval -EKG reviewed independently today which revealed normal sinus rhythm with QTC 482, which is an improvement from the EKG in the emergency department which revealed QTC 524 -Repeat EKG reviewed independently 10/11, NSR, no significant ST changes, QTc 449    Essential hypertension -Resume lisinopril.  Add Norvasc today due to elevated blood pressure  Alzheimer's dementia -Continue Namenda, Aricept  Frequent falls -CT head revealed ventriculomegaly.  Concern for NPH.  Review discussed over the phone with neurology, who reviewed imaging.  He recommends outpatient referral to neurosurgery for continued work-up, patient will need a lumbar puncture for definitive diagnosis.  Depression, anxiety -Continue Wellbutrin, Atarax as needed, Zoloft, trazodone nightly    DVT prophylaxis: Lovenox Code Status: Full code Family Communication: None Disposition Plan: SNF placement pending.  Repeat COVID testing ordered   Consultants:   Orthopedic surgery  Neurology   Procedures:   None  Antimicrobials:  Anti-infectives (From admission, onward)   Start     Dose/Rate Route Frequency Ordered Stop  01/19/19 1200  ceFAZolin (ANCEF) IVPB 2g/100 mL premix  Status:  Discontinued     2 g 200 mL/hr over 30 Minutes Intravenous To Short Stay 01/19/19 0149 01/19/19 1717   01/19/19 0600   vancomycin (VANCOCIN) IVPB 1000 mg/200 mL premix     1,000 mg 200 mL/hr over 60 Minutes Intravenous On call to O.R. 01/18/19 2035 01/19/19 1516       Objective: Vitals:   01/23/19 0356 01/23/19 0400 01/23/19 0600 01/23/19 0813  BP: (!) 201/76 (!) 195/90 (!) 175/85 (!) 180/80  Pulse: 70 65 72 77  Resp: 15   17  Temp: 98 F (36.7 C) 98.1 F (36.7 C) 98.4 F (36.9 C) 98 F (36.7 C)  TempSrc: Oral Oral Oral Oral  SpO2: 96%  94% 96%  Weight:      Height:        Intake/Output Summary (Last 24 hours) at 01/23/2019 1027 Last data filed at 01/23/2019 0500 Gross per 24 hour  Intake 1366.07 ml  Output 800 ml  Net 566.07 ml   Filed Weights   01/19/19 0135 01/19/19 1414  Weight: 45.4 kg 45.4 kg    Examination: General exam: Appears calm and comfortable  Respiratory system: Clear to auscultation. Respiratory effort normal. Cardiovascular system: S1 & S2 heard, RRR. No pedal edema. Gastrointestinal system: Abdomen is nondistended, soft and nontender. Normal bowel sounds heard. Central nervous system: Alert. Non focal exam. Speech clear.  Left lower facial droop not as prominent from my examination couple days ago Extremities: Symmetric in appearance bilaterally  Skin: No rashes, lesions or ulcers on exposed skin  Psychiatry: Dementia and pleasantly confused  Data Reviewed: I have personally reviewed following labs and imaging studies  CBC: Recent Labs  Lab 01/18/19 1645 01/19/19 0236 01/21/19 0233 01/22/19 0655  WBC 9.5 8.3 9.1 7.5  NEUTROABS 8.0* 6.9  --   --   HGB 12.2 11.2* 9.7* 9.9*  HCT 36.4 32.0* 28.5* 30.0*  MCV 89.0 87.0 89.1 91.2  PLT 204 182 193 [id number]   Basic Metabolic Panel: Recent Labs  Lab 01/19/19 0236 01/19/19 1208 01/20/19 0459 01/21/19 0233 01/22/19 0655 01/23/19 0205  NA 138  --  136 136 141 136  K 3.2*  --  3.3* 3.5 3.3* 3.8  CL 101  --  102 103 107 101  CO2 23  --  23 21* 18* 24  GLUCOSE 122*  --  97 84 77 100*  BUN 10  --  13 21 16 12     CREATININE 0.96  --  0.95 0.82 0.87 0.74  CALCIUM 9.3  --  9.1 8.9 8.9 8.9  MG  --  1.6* 2.0 2.0  --  1.7   GFR: Estimated Creatinine Clearance: 39.5 mL/min (by C-G formula based on SCr of 0.74 mg/dL). Liver Function Tests: Recent Labs  Lab 01/18/19 1645 01/19/19 0236  AST 19 24  ALT 13 16  ALKPHOS 124 117  BILITOT 0.8 1.2  PROT 6.7 6.6  ALBUMIN 3.6 3.5   No results for input(s): LIPASE, AMYLASE in the last 168 hours. No results for input(s): AMMONIA in the last 168 hours. Coagulation Profile: Recent Labs  Lab 01/18/19 1645 01/19/19 0236  INR 1.0 1.1   Cardiac Enzymes: No results for input(s): CKTOTAL, CKMB, CKMBINDEX, TROPONINI in the last 168 hours. BNP (last 3 results) No results for input(s): PROBNP in the last 8760 hours. HbA1C: No results for input(s): HGBA1C in the last 72 hours. CBG: Recent Labs  Lab 01/21/19 [phone]  GLUCAP 82   Lipid Profile: No results for input(s): CHOL, HDL, LDLCALC, TRIG, CHOLHDL, LDLDIRECT in the last 72 hours. Thyroid Function Tests: No results for input(s): TSH, T4TOTAL, FREET4, T3FREE, THYROIDAB in the last 72 hours. Anemia Panel: No results for input(s): VITAMINB12, FOLATE, FERRITIN, TIBC, IRON, RETICCTPCT in the last 72 hours. Sepsis Labs: No results for input(s): PROCALCITON, LATICACIDVEN in the last 168 hours.  Recent Results (from the past 240 hour(s))  SARS Coronavirus 2 by RT PCR (hospital order, performed in [facility] hospital lab) Nasopharyngeal Nasopharyngeal Swab     Status: None   Collection Time: 01/18/19  7:17 PM   Specimen: Nasopharyngeal Swab  Result Value Ref Range Status   SARS Coronavirus 2 NEGATIVE NEGATIVE Final    Comment: (NOTE) If result is NEGATIVE SARS-CoV-2 target nucleic acids are NOT DETECTED. The SARS-CoV-2 RNA is generally detectable in upper and lower  respiratory specimens during the acute phase of infection. The lowest  concentration of SARS-CoV-2 viral copies this assay can detect is 250   copies / mL. A negative result does not preclude SARS-CoV-2 infection  and should not be used as the sole basis for treatment or other  patient management decisions.  A negative result may occur with  improper specimen collection / handling, submission of specimen other  than nasopharyngeal swab, presence of viral mutation(s) within the  areas targeted by this assay, and inadequate number of viral copies  (<250 copies / mL). A negative result must be combined with clinical  observations, patient history, and epidemiological information. If result is POSITIVE SARS-CoV-2 target nucleic acids are DETECTED. The SARS-CoV-2 RNA is generally detectable in upper and lower  respiratory specimens dur ing the acute phase of infection.  Positive  results are indicative of active infection with SARS-CoV-2.  Clinical  correlation with patient history and other diagnostic information is  necessary to determine patient infection status.  Positive results do  not rule out bacterial infection or co-infection with other viruses. If result is PRESUMPTIVE POSTIVE SARS-CoV-2 nucleic acids MAY BE PRESENT.   A presumptive positive result was obtained on the submitted specimen  and confirmed on repeat testing.  While 2019 novel coronavirus  (SARS-CoV-2) nucleic acids may be present in the submitted sample  additional confirmatory testing may be necessary for epidemiological  and / or clinical management purposes  to differentiate between  SARS-CoV-2 and other Sarbecovirus currently known to infect humans.  If clinically indicated additional testing with an alternate test  methodology [phone]) is advised. The SARS-CoV-2 RNA is generally  detectable in upper and lower respiratory sp ecimens during the acute  phase of infection. The expected result is Negative. Fact Sheet for Patients:  StrictlyIdeas.no Fact Sheet for Healthcare  Providers: BankingDealers.co.za This test is not yet approved or cleared by the [place] FDA and has been authorized for detection and/or diagnosis of SARS-CoV-2 by FDA under an Emergency Use Authorization (EUA).  This EUA will remain in effect (meaning this test can be used) for the duration of the COVID-19 declaration under Section 564(b)(1) of the Act, 21 U.S.C. section 360bbb-3(b)(1), unless the authorization is terminated or revoked sooner. Performed at [facility], [place] [address] [place], [place] [address]   Surgical pcr screen     Status: None   Collection Time: 01/18/19 10:14 PM   Specimen: Nasal Mucosa; Nasal Swab  Result Value Ref Range Status   MRSA, PCR NEGATIVE NEGATIVE Final   Staphylococcus aureus NEGATIVE NEGATIVE Final  Comment: (NOTE) The Xpert SA Assay (FDA approved for NASAL specimens in patients 38 years of age and older), is one component of a comprehensive surveillance program. It is not intended to diagnose infection nor to guide or monitor treatment. Performed at [facility], [place] [address], [place],  [address]       Radiology Studies: Mr Brain Wo Contrast  Result Date: 01/21/2019 CLINICAL DATA:  Left facial droop. EXAM: MRI HEAD WITHOUT CONTRAST TECHNIQUE: Multiplanar, multiecho pulse sequences of the brain and surrounding structures were obtained without intravenous contrast. COMPARISON:  Head CT, CTA, and CTP 01/21/2019 and MRI 06/26/2012 FINDINGS: The study is mildly motion degraded. Brain: There is no evidence of acute infarct, intracranial hemorrhage, mass, midline shift, or extra-axial fluid collection. Patchy T2 hyperintensities in the cerebral white matter and pons have progressed from 2014 and are nonspecific but compatible with moderate chronic small vessel ischemic disease. Lateral ventriculomegaly, including prominent dilatation of the occipital and temporal horns, has greatly progressed from  2014, and there is milder dilatation of the third and fourth ventricles. Ventricular dilatation is out of proportion to the size of the sulci, and there is mild sulcal crowding at the vertex and narrowing of the callosal angle. Cerebral atrophy is present including severe bilateral mesial temporal lobe atrophy. Vascular: Major intracranial vascular flow voids are preserved. Skull and upper cervical spine: Unremarkable bone marrow signal. Sinuses/Orbits: Bilateral cataract extraction. Paranasal sinuses and mastoid air cells are clear. Other: None. IMPRESSION: 1. No acute intracranial abnormality. 2. Moderate chronic small vessel ischemic disease and cerebral atrophy. 3. Progressive ventriculomegaly since 2014 which could reflect normal pressure hydrocephalus in the appropriate clinical setting. Electronically Signed   By: Logan Bores M.D.   On: 01/21/2019 14:34      Scheduled Meds:  amLODipine  5 mg Oral Daily   aspirin  81 mg Oral QHS   buPROPion  150 mg Oral Daily   docusate sodium  100 mg Oral BID   donepezil  5 mg Oral QHS   enoxaparin (LOVENOX) injection  40 mg Subcutaneous Q24H   feeding supplement (ENSURE ENLIVE)  237 mL Oral BID BM   fluticasone  1 spray Each Nare BID   lisinopril  20 mg Oral Daily   megestrol  20 mg Oral Daily   memantine  10 mg Oral Daily   pantoprazole  40 mg Oral Daily   sertraline  100 mg Oral Daily   timolol  1 drop Both Eyes Daily   traZODone  50 mg Oral QHS   Continuous Infusions:  lactated ringers 50 mL/hr at 01/23/19 0834   methocarbamol (ROBAXIN) IV       LOS: 5 days      Time spent: 25 minutes   Dessa Phi, DO [facility] 01/23/2019, 10:27 AM   Available via Epic secure chat 7am-7pm After these hours, please refer to coverage provider listed on [email or web address]

## 2019-01-24 LAB — BASIC METABOLIC PANEL
Anion gap: 13 (ref 5–15)
BUN: 11 mg/dL (ref 8–23)
CO2: 22 mmol/L (ref 22–32)
Calcium: 9.1 mg/dL (ref 8.9–10.3)
Chloride: 100 mmol/L (ref 98–111)
Creatinine, Ser: 0.7 mg/dL (ref 0.44–1.00)
GFR calc Af Amer: >60 mL/min (ref >60)
GFR calc non Af Amer: >60 mL/min (ref >60)
Glucose, Bld: 91 mg/dL (ref 70–99)
Potassium: 3.3 mmol/L — ABNORMAL LOW (ref 3.5–5.1)
Sodium: 135 mmol/L (ref 135–145)

## 2019-01-24 LAB — CBC
HCT: 29.7 % — ABNORMAL LOW (ref 36.0–46.0)
Hemoglobin: 10.1 g/dL — ABNORMAL LOW (ref 12.0–15.0)
MCH: 30 pg (ref 26.0–34.0)
MCHC: 34 g/dL (ref 30.0–36.0)
MCV: 88.1 fL (ref 80.0–100.0)
Platelets: 238 10*3/uL (ref 150–400)
RBC: 3.37 MIL/uL — ABNORMAL LOW (ref 3.87–5.11)
RDW: 13.2 % (ref 11.5–15.5)
WBC: 6.6 10*3/uL (ref 4.0–10.5)
nRBC: 0 % (ref 0.0–0.2)

## 2019-01-24 LAB — MAGNESIUM: Magnesium: 1.7 mg/dL (ref 1.7–2.4)

## 2019-01-24 MED ORDER — POTASSIUM CHLORIDE CRYS ER 20 MEQ PO TBCR
40.0000 meq | EXTENDED_RELEASE_TABLET | Freq: Once | ORAL | Status: AC
  Administered 2019-01-24: 40 meq via ORAL
  Filled 2019-01-24: qty 2

## 2019-01-24 NOTE — Plan of Care (Signed)
  Problem: Pain Managment: Goal: General experience of comfort will improve Outcome: Progressing   Problem: Safety: Goal: Ability to remain free from injury will improve Outcome: Progressing   

## 2019-01-24 NOTE — Progress Notes (Addendum)
PROGRESS NOTE    Shelly Cervantes  [id number] DOB: [birth year] DOA: 01/18/2019 PCP: [name], MD   Brief Narrative:  HPI on 01/18/2019 by Dr. Mitzi Hansen Shelly Cervantes is a 83 y.o. female with medical history significant for dementia, breast cancer status post definitive surgery, hypertension, and anxiety, now presenting to the emergency department for evaluation of left hip pain after a fall.  Patient's husband is at the bedside and has been concerned about patient's recent gait difficulty with frequent falls, reporting that this is the fourth fall she has had in the past few weeks.  She had not been complaining of anything leading up to this and seemed to be having an uneventful day.  Patient denies losing consciousness or experiencing any chest pain or lightheadedness prior to the fall.  Her husband heard her yell from another room, found her on the floor complaining of severe left hip pain, and called EMS.  There has not been any recent cough, shortness of breath, fevers, or chest pain.  Interim history Patient admitted for acute nondisplaced left intertrochanteric femur fracture.  Orthopedic surgery consulted and appreciated, underwent IM fixation of the left hip on 01/19/2019.  On 01/21/2019, patient had an acute episode of left-sided facial droop, code stroke was called and neurology consulted.  Imaging has been negative.  Patient currently pending SNF placement. Assessment & Plan   Left hip fracture -Secondary to fall -Surgery consulted and appreciated, status post IM fixation on 01/19/2019 with Dr. Stann Mainland -Continue pain control -PT/OT recommending SNF  Left facial droop -Code stroke called on 01/21/2019 -CT head showed no evidence of acute intracranial abnormality -CTA head and neck no emergent large vessel occlusion -MRI brain showed no acute or cranial normality -Echocardiogram EF 60 to 65%, consistent with impaired relaxation pattern LV diastolic filling -Neurology  consulted and appreciated, did not feel this was indicative of stroke or TIA -Continue aspirin daily  Essential hypertension -Continue lisinopril, amlodipine  Alzheimer's dementia -Continue Namenda, Aricept  Frequent falls -CT head revealed ventriculomegaly.  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  Recommended outpatient referral to neurosurgery for continued work-up and possible need for by lumbar puncture for definitive diagnosis.  Depression/anxiety -Continue Wellbutrin, Atarax as needed, Zoloft, trazodone  Prolonged QT interval -Improving -EKG in the emergency department revealed a QTC of 524, on repeat EKG on 01/21/2019 showing normal sinus rhythm no significant ST changes, QTC of 449.  Hypokalemia -will replace and continue to monitor BMP -obtain magnesium  DVT Prophylaxis  Lovenox  Code Status: Full  Family Communication: None at bedside  Disposition Plan: Admitted. Pending SNF placement and repeat COVID testing.  Consultants Orthopedics Neurology  Procedures  INTRAMEDULLARY (IM) NAIL FEMORAL (Left) Echocardiogram  Antibiotics   Anti-infectives (From admission, onward)   Start     Dose/Rate Route Frequency Ordered Stop   01/19/19 1200  ceFAZolin (ANCEF) IVPB 2g/100 mL premix  Status:  Discontinued     2 g 200 mL/hr over 30 Minutes Intravenous To Short Stay 01/19/19 0149 01/19/19 1717   01/19/19 0600  vancomycin (VANCOCIN) IVPB 1000 mg/200 mL premix     1,000 mg 200 mL/hr over 60 Minutes Intravenous On call to O.R. 01/18/19 2035 01/19/19 1516      Subjective:   Shelly Cervantes seen and examined today.  Patient with dementia. No complaints this morning.    Objective:   Vitals:   01/23/19 1758 01/23/19 1951 01/24/19 0341 01/24/19 0812  BP: Marland Kitchen)  147/62 (!) 172/71 (!) 171/79 (!) 163/75  Pulse: 68 72 72 79  Resp:  16 16 17   Temp:  98.2 F (36.8 C) 98.5 F (36.9 C) 97.9 F (36.6 C)  TempSrc:  Oral  Oral Oral  SpO2:  98% 99%   Weight:      Height:        Intake/Output Summary (Last 24 hours) at 01/24/2019 1250 Last data filed at 01/24/2019 0900 Gross per 24 hour  Intake 600 ml  Output 500 ml  Net 100 ml   Filed Weights   01/19/19 0135 01/19/19 1414  Weight: 45.4 kg 45.4 kg    Exam  General: Well developed, well nourished, elderly, NAD, appears stated age  29: NCAT, mucous membranes moist.   Cardiovascular: S1 S2 auscultated, RRR, no murmur appreiated  Respiratory: Clear to auscultation bilaterally with equal chest rise  Abdomen: Soft, nontender, nondistended, + bowel sounds  Extremities: warm dry without cyanosis clubbing or edema  Neuro: AAOx1, dementia, nonfocal  Psych: Pleasantly confused   Data Reviewed: I have personally reviewed following labs and imaging studies  CBC: Recent Labs  Lab 01/18/19 1645 01/19/19 0236 01/21/19 0233 01/22/19 0655 01/24/19 0414  WBC 9.5 8.3 9.1 7.5 6.6  NEUTROABS 8.0* 6.9  --   --   --   HGB 12.2 11.2* 9.7* 9.9* 10.1*  HCT 36.4 32.0* 28.5* 30.0* 29.7*  MCV 89.0 87.0 89.1 91.2 88.1  PLT 204 182 193 228 [id number]   Basic Metabolic Panel: Recent Labs  Lab 01/19/19 1208 01/20/19 0459 01/21/19 0233 01/22/19 0655 01/23/19 0205 01/24/19 0414  NA  --  136 136 141 136 135  K  --  3.3* 3.5 3.3* 3.8 3.3*  CL  --  102 103 107 101 100  CO2  --  23 21* 18* 24 22  GLUCOSE  --  97 84 77 100* 91  BUN  --  13 21 16 12 11   CREATININE  --  0.95 0.82 0.87 0.74 0.70  CALCIUM  --  9.1 8.9 8.9 8.9 9.1  MG 1.6* 2.0 2.0  --  1.7  --    GFR: Estimated Creatinine Clearance: 39.5 mL/min (by C-G formula based on SCr of 0.7 mg/dL). Liver Function Tests: Recent Labs  Lab 01/18/19 1645 01/19/19 0236  AST 19 24  ALT 13 16  ALKPHOS 124 117  BILITOT 0.8 1.2  PROT 6.7 6.6  ALBUMIN 3.6 3.5   No results for input(s): LIPASE, AMYLASE in the last 168 hours. No results for input(s): AMMONIA in the last 168 hours. Coagulation Profile:  Recent Labs  Lab 01/18/19 1645 01/19/19 0236  INR 1.0 1.1   Cardiac Enzymes: No results for input(s): CKTOTAL, CKMB, CKMBINDEX, TROPONINI in the last 168 hours. BNP (last 3 results) No results for input(s): PROBNP in the last 8760 hours. HbA1C: No results for input(s): HGBA1C in the last 72 hours. CBG: Recent Labs  Lab 01/21/19 0842  GLUCAP 82   Lipid Profile: No results for input(s): CHOL, HDL, LDLCALC, TRIG, CHOLHDL, LDLDIRECT in the last 72 hours. Thyroid Function Tests: No results for input(s): TSH, T4TOTAL, FREET4, T3FREE, THYROIDAB in the last 72 hours. Anemia Panel: No results for input(s): VITAMINB12, FOLATE, FERRITIN, TIBC, IRON, RETICCTPCT in the last 72 hours. Urine analysis: No results found for: COLORURINE, APPEARANCEUR, LABSPEC, PHURINE, GLUCOSEU, HGBUR, BILIRUBINUR, KETONESUR, PROTEINUR, UROBILINOGEN, NITRITE, LEUKOCYTESUR Sepsis Labs: @LABRCNTIP (procalcitonin:4,lacticidven:4)  ) Recent Results (from the past 240 hour(s))  SARS Coronavirus 2 by RT PCR (hospital order, performed  in [facility] lab) Nasopharyngeal Nasopharyngeal Swab     Status: None   Collection Time: 01/18/19  7:17 PM   Specimen: Nasopharyngeal Swab  Result Value Ref Range Status   SARS Coronavirus 2 NEGATIVE NEGATIVE Final    Comment: (NOTE) If result is NEGATIVE SARS-CoV-2 target nucleic acids are NOT DETECTED. The SARS-CoV-2 RNA is generally detectable in upper and lower  respiratory specimens during the acute phase of infection. The lowest  concentration of SARS-CoV-2 viral copies this assay can detect is 250  copies / mL. A negative result does not preclude SARS-CoV-2 infection  and should not be used as the sole basis for treatment or other  patient management decisions.  A negative result may occur with  improper specimen collection / handling, submission of specimen other  than nasopharyngeal swab, presence of viral mutation(s) within the  areas targeted by this assay,  and inadequate number of viral copies  (<250 copies / mL). A negative result must be combined with clinical  observations, patient history, and epidemiological information. If result is POSITIVE SARS-CoV-2 target nucleic acids are DETECTED. The SARS-CoV-2 RNA is generally detectable in upper and lower  respiratory specimens dur ing the acute phase of infection.  Positive  results are indicative of active infection with SARS-CoV-2.  Clinical  correlation with patient history and other diagnostic information is  necessary to determine patient infection status.  Positive results do  not rule out bacterial infection or co-infection with other viruses. If result is PRESUMPTIVE POSTIVE SARS-CoV-2 nucleic acids MAY BE PRESENT.   A presumptive positive result was obtained on the submitted specimen  and confirmed on repeat testing.  While 2019 novel coronavirus  (SARS-CoV-2) nucleic acids may be present in the submitted sample  additional confirmatory testing may be necessary for epidemiological  and / or clinical management purposes  to differentiate between  SARS-CoV-2 and other Sarbecovirus currently known to infect humans.  If clinically indicated additional testing with an alternate test  methodology [phone]) is advised. The SARS-CoV-2 RNA is generally  detectable in upper and lower respiratory sp ecimens during the acute  phase of infection. The expected result is Negative. Fact Sheet for Patients:  StrictlyIdeas.no Fact Sheet for Healthcare Providers: BankingDealers.co.za This test is not yet approved or cleared by the [place] FDA and has been authorized for detection and/or diagnosis of SARS-CoV-2 by FDA under an Emergency Use Authorization (EUA).  This EUA will remain in effect (meaning this test can be used) for the duration of the COVID-19 declaration under Section 564(b)(1) of the Act, 21 U.S.C. section 360bbb-3(b)(1), unless  the authorization is terminated or revoked sooner. Performed at [facility], [place] [address], [place], [place] [address]   Surgical pcr screen     Status: None   Collection Time: 01/18/19 10:14 PM   Specimen: Nasal Mucosa; Nasal Swab  Result Value Ref Range Status   MRSA, PCR NEGATIVE NEGATIVE Final   Staphylococcus aureus NEGATIVE NEGATIVE Final    Comment: (NOTE) The Xpert SA Assay (FDA approved for NASAL specimens in patients 80 years of age and older), is one component of a comprehensive surveillance program. It is not intended to diagnose infection nor to guide or monitor treatment. Performed at [facility], [place] [address] [place], [place] [address]       Radiology Studies: No results found.   Scheduled Meds: . amLODipine  5 mg Oral Daily  . aspirin  81 mg Oral QHS  . buPROPion  150 mg Oral Daily  . docusate sodium  100 mg Oral BID  . donepezil  5 mg Oral QHS  . enoxaparin (LOVENOX) injection  40 mg Subcutaneous Q24H  . feeding supplement (ENSURE ENLIVE)  237 mL Oral BID BM  . fluticasone  1 spray Each Nare BID  . lisinopril  40 mg Oral Daily  . megestrol  20 mg Oral Daily  . memantine  10 mg Oral Daily  . pantoprazole  40 mg Oral Daily  . sertraline  100 mg Oral Daily  . timolol  1 drop Both Eyes Daily  . traZODone  50 mg Oral QHS   Continuous Infusions: . methocarbamol (ROBAXIN) IV       LOS: 6 days   Time Spent in minutes   30 minutes  Tenishia Ekman D.O. on 01/24/2019 at 12:50 PM  Between 7am to 7pm - Please see pager noted on [email or web address]  After 7pm go to [email or web address]  And look for the night coverage person covering for me after hours  [facility] Office  [phone]

## 2019-01-25 LAB — BASIC METABOLIC PANEL
Anion gap: 13 (ref 5–15)
BUN: 12 mg/dL (ref 8–23)
CO2: 23 mmol/L (ref 22–32)
Calcium: 9.1 mg/dL (ref 8.9–10.3)
Chloride: 100 mmol/L (ref 98–111)
Creatinine, Ser: 0.78 mg/dL (ref 0.44–1.00)
GFR calc Af Amer: >60 mL/min (ref >60)
GFR calc non Af Amer: >60 mL/min (ref >60)
Glucose, Bld: 87 mg/dL (ref 70–99)
Potassium: 3.3 mmol/L — ABNORMAL LOW (ref 3.5–5.1)
Sodium: 136 mmol/L (ref 135–145)

## 2019-01-25 LAB — NOVEL CORONAVIRUS, NAA (HOSP ORDER, SEND-OUT TO REF LAB; TAT 18-24 HRS): SARS-CoV-2, NAA: NOT DETECTED

## 2019-01-25 MED ORDER — POTASSIUM CHLORIDE 20 MEQ/15ML (10%) PO SOLN
40.0000 meq | Freq: Every day | ORAL | Status: DC
  Administered 2019-01-25 – 2019-01-26 (×2): 40 meq via ORAL
  Filled 2019-01-25 (×2): qty 30

## 2019-01-25 MED ORDER — MAGNESIUM SULFATE IN D5W 1-5 GM/100ML-% IV SOLN
1.0000 g | Freq: Once | INTRAVENOUS | Status: AC
  Administered 2019-01-25: 1 g via INTRAVENOUS
  Filled 2019-01-25: qty 100

## 2019-01-25 MED ORDER — MAGNESIUM SULFATE 50 % IJ SOLN
1.0000 g | Freq: Once | INTRAMUSCULAR | Status: DC
  Filled 2019-01-25: qty 2

## 2019-01-25 NOTE — Progress Notes (Signed)
PROGRESS NOTE    Shelly Cervantes  [id number] DOB: 1935/07/09 DOA: 01/18/2019 PCP: [name], MD   Brief Narrative:  HPI on 01/18/2019 by Dr. Mitzi Hansen Shelly Cervantes is a 83 y.o. female with medical history significant for dementia, breast cancer status post definitive surgery, hypertension, and anxiety, now presenting to the emergency department for evaluation of left hip pain after a fall.  Patient's husband is at the bedside and has been concerned about patient's recent gait difficulty with frequent falls, reporting that this is the fourth fall she has had in the past few weeks.  She had not been complaining of anything leading up to this and seemed to be having an uneventful day.  Patient denies losing consciousness or experiencing any chest pain or lightheadedness prior to the fall.  Her husband heard her yell from another room, found her on the floor complaining of severe left hip pain, and called EMS.  There has not been any recent cough, shortness of breath, fevers, or chest pain.  Interim history Patient admitted for acute nondisplaced left intertrochanteric femur fracture.  Orthopedic surgery consulted and appreciated, underwent IM fixation of the left hip on 01/19/2019.  On 01/21/2019, patient had an acute episode of left-sided facial droop, code stroke was called and neurology consulted.  Imaging has been negative.  Patient currently pending SNF placement. Assessment & Plan   Left hip fracture -Secondary to fall -Surgery consulted and appreciated, status post IM fixation on 01/19/2019 with Dr. Stann Mainland -Continue pain control -PT/OT recommending SNF  Left facial droop -Code stroke called on 01/21/2019 -CT head showed no evidence of acute intracranial abnormality -CTA head and neck no emergent large vessel occlusion -MRI brain showed no acute or cranial normality -Echocardiogram EF 60 to 65%, consistent with impaired relaxation pattern LV diastolic filling -Neurology  consulted and appreciated, did not feel this was indicative of stroke or TIA -Continue aspirin daily  Essential hypertension -Continue lisinopril, amlodipine  Alzheimer's dementia -Continue Namenda, Aricept  Frequent falls -CT head revealed ventriculomegaly.  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  Recommended outpatient referral to neurosurgery for continued work-up and possible need for by lumbar puncture for definitive diagnosis.  Depression/anxiety -Continue Wellbutrin, Atarax as needed, Zoloft, trazodone  Prolonged QT interval -Improving -EKG in the emergency department revealed a QTC of 524, on repeat EKG on 01/21/2019 showing normal sinus rhythm no significant ST changes, QTC of 449.  Hypokalemia -Although replaced on 10/14, continues to be 3.3.  Magnesium 1.7.  Will replace both and continue to monitor   DVT Prophylaxis  Lovenox  Code Status: Full  Family Communication: None at bedside  Disposition Plan: Admitted. Pending SNF placement and repeat COVID testing.  Consultants Orthopedics Neurology  Procedures  INTRAMEDULLARY (IM) NAIL FEMORAL (Left) Echocardiogram  Antibiotics   Anti-infectives (From admission, onward)   Start     Dose/Rate Route Frequency Ordered Stop   01/19/19 1200  ceFAZolin (ANCEF) IVPB 2g/100 mL premix  Status:  Discontinued     2 g 200 mL/hr over 30 Minutes Intravenous To Short Stay 01/19/19 0149 01/19/19 1717   01/19/19 0600  vancomycin (VANCOCIN) IVPB 1000 mg/200 mL premix     1,000 mg 200 mL/hr over 60 Minutes Intravenous On call to O.R. 01/18/19 2035 01/19/19 1516      Subjective:   Shelly Cervantes seen and examined today.  Patient with dementia.  No complaints this morning.  Feels tired.  Objective:   Vitals:  01/24/19 1613 01/24/19 1922 01/25/19 0326 01/25/19 0847  BP: (!) 170/80 (!) 144/82 (!) 144/66 (!) 172/82  Pulse: 87 80 79 84  Resp: 15 18 18 18   Temp: 97.9 F  (36.6 C) 98.5 F (36.9 C) 97.9 F (36.6 C) 97.6 F (36.4 C)  TempSrc: Oral Oral Oral Oral  SpO2: 99% 97% 98% 98%  Weight:      Height:        Intake/Output Summary (Last 24 hours) at 01/25/2019 1054 Last data filed at 01/24/2019 1700 Gross per 24 hour  Intake 240 ml  Output 500 ml  Net -260 ml   Filed Weights   01/19/19 0135 01/19/19 1414  Weight: 45.4 kg 45.4 kg   Exam  General: Well developed, well nourished, elderly, NAD  HEENT: NCAT, mucous membranes moist.   Cardiovascular: S1 S2 auscultated, RRR, no murmur  Respiratory: Clear to auscultation bilaterally  Abdomen: Soft, nontender, nondistended, + bowel sounds  Extremities: warm dry without cyanosis clubbing or edema  Neuro: AAOx1, dementia, nonfocal  Psych: Pleasantly confused  Data Reviewed: I have personally reviewed following labs and imaging studies  CBC: Recent Labs  Lab 01/18/19 1645 01/19/19 0236 01/21/19 0233 01/22/19 0655 01/24/19 0414  WBC 9.5 8.3 9.1 7.5 6.6  NEUTROABS 8.0* 6.9  --   --   --   HGB 12.2 11.2* 9.7* 9.9* 10.1*  HCT 36.4 32.0* 28.5* 30.0* 29.7*  MCV 89.0 87.0 89.1 91.2 88.1  PLT 204 182 193 228 [id number]   Basic Metabolic Panel: Recent Labs  Lab 01/19/19 1208 01/20/19 0459 01/21/19 0233 01/22/19 0655 01/23/19 0205 01/24/19 0414 01/24/19 1303 01/25/19 0732  NA  --  136 136 141 136 135  --  136  K  --  3.3* 3.5 3.3* 3.8 3.3*  --  3.3*  CL  --  102 103 107 101 100  --  100  CO2  --  23 21* 18* 24 22  --  23  GLUCOSE  --  97 84 77 100* 91  --  87  BUN  --  13 21 16 12 11   --  12  CREATININE  --  0.95 0.82 0.87 0.74 0.70  --  0.78  CALCIUM  --  9.1 8.9 8.9 8.9 9.1  --  9.1  MG 1.6* 2.0 2.0  --  1.7  --  1.7  --    GFR: Estimated Creatinine Clearance: 39.5 mL/min (by C-G formula based on SCr of 0.78 mg/dL). Liver Function Tests: Recent Labs  Lab 01/18/19 1645 01/19/19 0236  AST 19 24  ALT 13 16  ALKPHOS 124 117  BILITOT 0.8 1.2  PROT 6.7 6.6  ALBUMIN 3.6 3.5    No results for input(s): LIPASE, AMYLASE in the last 168 hours. No results for input(s): AMMONIA in the last 168 hours. Coagulation Profile: Recent Labs  Lab 01/18/19 1645 01/19/19 0236  INR 1.0 1.1   Cardiac Enzymes: No results for input(s): CKTOTAL, CKMB, CKMBINDEX, TROPONINI in the last 168 hours. BNP (last 3 results) No results for input(s): PROBNP in the last 8760 hours. HbA1C: No results for input(s): HGBA1C in the last 72 hours. CBG: Recent Labs  Lab 01/21/19 0842  GLUCAP 82   Lipid Profile: No results for input(s): CHOL, HDL, LDLCALC, TRIG, CHOLHDL, LDLDIRECT in the last 72 hours. Thyroid Function Tests: No results for input(s): TSH, T4TOTAL, FREET4, T3FREE, THYROIDAB in the last 72 hours. Anemia Panel: No results for input(s): VITAMINB12, FOLATE, FERRITIN, TIBC, IRON, RETICCTPCT in  the last 72 hours. Urine analysis: No results found for: COLORURINE, APPEARANCEUR, LABSPEC, PHURINE, GLUCOSEU, HGBUR, BILIRUBINUR, KETONESUR, PROTEINUR, UROBILINOGEN, NITRITE, LEUKOCYTESUR Sepsis Labs: @LABRCNTIP (procalcitonin:4,lacticidven:4)  ) Recent Results (from the past 240 hour(s))  SARS Coronavirus 2 by RT PCR (hospital order, performed in [facility] hospital lab) Nasopharyngeal Nasopharyngeal Swab     Status: None   Collection Time: 01/18/19  7:17 PM   Specimen: Nasopharyngeal Swab  Result Value Ref Range Status   SARS Coronavirus 2 NEGATIVE NEGATIVE Final    Comment: (NOTE) If result is NEGATIVE SARS-CoV-2 target nucleic acids are NOT DETECTED. The SARS-CoV-2 RNA is generally detectable in upper and lower  respiratory specimens during the acute phase of infection. The lowest  concentration of SARS-CoV-2 viral copies this assay can detect is 250  copies / mL. A negative result does not preclude SARS-CoV-2 infection  and should not be used as the sole basis for treatment or other  patient management decisions.  A negative result may occur with  improper specimen collection  / handling, submission of specimen other  than nasopharyngeal swab, presence of viral mutation(s) within the  areas targeted by this assay, and inadequate number of viral copies  (<250 copies / mL). A negative result must be combined with clinical  observations, patient history, and epidemiological information. If result is POSITIVE SARS-CoV-2 target nucleic acids are DETECTED. The SARS-CoV-2 RNA is generally detectable in upper and lower  respiratory specimens dur ing the acute phase of infection.  Positive  results are indicative of active infection with SARS-CoV-2.  Clinical  correlation with patient history and other diagnostic information is  necessary to determine patient infection status.  Positive results do  not rule out bacterial infection or co-infection with other viruses. If result is PRESUMPTIVE POSTIVE SARS-CoV-2 nucleic acids MAY BE PRESENT.   A presumptive positive result was obtained on the submitted specimen  and confirmed on repeat testing.  While 2019 novel coronavirus  (SARS-CoV-2) nucleic acids may be present in the submitted sample  additional confirmatory testing may be necessary for epidemiological  and / or clinical management purposes  to differentiate between  SARS-CoV-2 and other Sarbecovirus currently known to infect humans.  If clinically indicated additional testing with an alternate test  methodology [phone]) is advised. The SARS-CoV-2 RNA is generally  detectable in upper and lower respiratory sp ecimens during the acute  phase of infection. The expected result is Negative. Fact Sheet for Patients:  StrictlyIdeas.no Fact Sheet for Healthcare Providers: BankingDealers.co.za This test is not yet approved or cleared by the [place] FDA and has been authorized for detection and/or diagnosis of SARS-CoV-2 by FDA under an Emergency Use Authorization (EUA).  This EUA will remain in effect (meaning this  test can be used) for the duration of the COVID-19 declaration under Section 564(b)(1) of the Act, 21 U.S.C. section 360bbb-3(b)(1), unless the authorization is terminated or revoked sooner. Performed at [facility], [place] [address], [place],  [address]   Surgical pcr screen     Status: None   Collection Time: 01/18/19 10:14 PM   Specimen: Nasal Mucosa; Nasal Swab  Result Value Ref Range Status   MRSA, PCR NEGATIVE NEGATIVE Final   Staphylococcus aureus NEGATIVE NEGATIVE Final    Comment: (NOTE) The Xpert SA Assay (FDA approved for NASAL specimens in patients 49 years of age and older), is one component of a comprehensive surveillance program. It is not intended to diagnose infection nor to guide or monitor treatment. Performed at  [facility], [place] [address], [place], [place] [address]       Radiology Studies: No results found.   Scheduled Meds: . amLODipine  5 mg Oral Daily  . aspirin  81 mg Oral QHS  . buPROPion  150 mg Oral Daily  . docusate sodium  100 mg Oral BID  . donepezil  5 mg Oral QHS  . enoxaparin (LOVENOX) injection  40 mg Subcutaneous Q24H  . feeding supplement (ENSURE ENLIVE)  237 mL Oral BID BM  . fluticasone  1 spray Each Nare BID  . lisinopril  40 mg Oral Daily  . megestrol  20 mg Oral Daily  . memantine  10 mg Oral Daily  . pantoprazole  40 mg Oral Daily  . sertraline  100 mg Oral Daily  . timolol  1 drop Both Eyes Daily  . traZODone  50 mg Oral QHS   Continuous Infusions: . methocarbamol (ROBAXIN) IV       LOS: 7 days   Time Spent in minutes   30 minutes  Anthony Roland D.O. on 01/25/2019 at 10:54 AM  Between 7am to 7pm - Please see pager noted on [email or web address]  After 7pm go to [email or web address]  And look for the night coverage person covering for me after hours  [facility] Office  [phone]

## 2019-01-25 NOTE — Progress Notes (Signed)
Physical Therapy Treatment Patient Details Name: Shelly Cervantes MRN: [id number] DOB: 20-Oct-1935 Today's Date: 01/25/2019    History of Present Illness Shelly Cervantes is a 83 y.o. female with medical history significant for Alzheimer's dementia, breast cancer status post definitive surgery, hypertension, and anxiety, now presenting to the emergency department for evaluation of left hip pain after a fall.  Per husband report, patient has had recent gait difficulty with frequent falls, reporting that this is the fourth fall she has had in the past few weeks.  pt had IM nailing 01-19-2019    PT Comments    Pt remains very limited with functional mobility secondary to pain and weakness. She continues to require physical assistance of two for transfers. Continuing to recommend SNF for further intensive therapy services prior to returning home with family. Pt would continue to benefit from skilled physical therapy services at this time while admitted and after d/c to address the below listed limitations in order to improve overall safety and independence with functional mobility.   Follow Up Recommendations  SNF;Supervision/Assistance - 24 hour     Equipment Recommendations  None recommended by PT    Recommendations for Other Services       Precautions / Restrictions Precautions Precautions: Fall Restrictions Weight Bearing Restrictions: Yes LLE Weight Bearing: Weight bearing as tolerated    Mobility  Bed Mobility Overal bed mobility: Needs Assistance Bed Mobility: Supine to Sit     Supine to sit: Mod assist     General bed mobility comments: assistance needed for trunk elevation  Transfers Overall transfer level: Needs assistance Equipment used: Rolling walker (2 wheeled) Transfers: Sit to/from [facility] Sit to Stand: Mod assist;+2 physical assistance Stand pivot transfers: Mod assist;+2 physical assistance       General transfer comment: cueing for safe  hand placement and technique, pt standing initially and then sitting back down almost immediately secondary to fatigue; max encouragement needed to attempt again and for pivot to recliner chair towards her R side  Ambulation/Gait                 Stairs             Wheelchair Mobility    Modified Rankin (Stroke Patients Only)       Balance Overall balance assessment: Needs assistance Sitting-balance support: Feet supported Sitting balance-Leahy Scale: Poor Sitting balance - Comments: required min-mod A Postural control: Right lateral lean Standing balance support: Bilateral upper extremity supported Standing balance-Leahy Scale: Poor                              Cognition Arousal/Alertness: Awake/alert Behavior During Therapy: WFL for tasks assessed/performed;Flat affect Overall Cognitive Status: History of cognitive impairments - at baseline                                        Exercises General Exercises - Lower Extremity Ankle Circles/Pumps: AROM;Both;20 reps;Seated Long Arc Quad: AROM;Strengthening;Both;10 reps;Seated    General Comments        Pertinent Vitals/Pain Pain Assessment: Faces Faces Pain Scale: Hurts little more Pain Location: L hip Pain Descriptors / Indicators: Grimacing Pain Intervention(s): Monitored during session;Repositioned    Home Living                      Prior Function  PT Goals (current goals can now be found in the care plan section) Acute Rehab PT Goals PT Goal Formulation: With family Time For Goal Achievement: 01/27/19 Potential to Achieve Goals: Fair Progress towards PT goals: Progressing toward goals    Frequency    Min 2X/week      PT Plan Current plan remains appropriate    Co-evaluation              AM-PAC PT "6 Clicks" Mobility   Outcome Measure  Help needed turning from your back to your side while in a flat bed without using bedrails?:  A Lot Help needed moving from lying on your back to sitting on the side of a flat bed without using bedrails?: A Lot Help needed moving to and from a bed to a chair (including a wheelchair)?: A Lot Help needed standing up from a chair using your arms (e.g., wheelchair or bedside chair)?: A Lot Help needed to walk in hospital room?: A Lot Help needed climbing 3-5 steps with a railing? : Total 6 Click Score: 11    End of Session Equipment Utilized During Treatment: Gait belt Activity Tolerance: Patient tolerated treatment well Patient left: in chair;with call bell/phone within reach;with chair alarm set;with family/visitor present Nurse Communication: Mobility status PT Visit Diagnosis: Other abnormalities of gait and mobility (R26.89);Repeated falls (R29.6);History of falling (Z91.81);Difficulty in walking, not elsewhere classified (R26.2);Pain Pain - Right/Left: Left Pain - part of body: Hip     Time: [id number] PT Time Calculation (min) (ACUTE ONLY): 26 min  Charges:  $Therapeutic Exercise: 8-22 mins $Therapeutic Activity: 8-22 mins                     Sherie Don, PT, DPT  Acute Rehabilitation Services Pager [phone] Office [place] 01/25/2019, 4:16 PM

## 2019-01-25 NOTE — Plan of Care (Signed)

## 2019-01-25 NOTE — Progress Notes (Signed)
Nutrition Follow-up  DOCUMENTATION CODES:   Underweight  INTERVENTION:  Continue Ensure BID, each supplement provides 350 kcal and 20 grams protein.  Recommend bowel regimen   NUTRITION DIAGNOSIS:   Increased nutrient needs related to post-op healing as evidenced by estimated needs. Ongoing  GOAL:   Patient will meet greater than or equal to 90% of their needs  Progressing  MONITOR:   Supplement acceptance, Skin, Weight trends, Labs, I & O's, Diet advancement  REASON FOR ASSESSMENT:   Consult Hip fracture protocol  ASSESSMENT:  RD working remotely.  82 y.o. female with medical history significant for dementia, breast cancer status post definitive surgery, hypertension, and anxiety, now presenting to the emergency department for evaluation of left hip pain after a fall. Radiographs of the hip and pelvis demonstrate acute nondisplaced left intertrochanteric femur fracture as well as more subacute appearing nondisplaced fractures of the right superior and inferior pubic rami.  10/9: IM fixation of left hip 10/11: acute episode of left-sided facial droop; imaging has been negative. Patient currently pending SNF placement.  Patient with poor oral intake; eating 25-50% of the last 8 documented meals. Per chart review patient reports eating fairly well PTA with usual consumption of at least 2 meals a day with an Ensure shake at least once daily. She endorsed weight loss over the past ~ 9 months with usual weight of ~130 lbs. Pt was unable to provide reason for weight loss. Per weight records, pt with a 15% weight loss in 4 months, which is significant for time frame.  Per chart review, patient last bowel movement was 10/8 and is receiving stool softener BID. Per medication history, patient refused Senokot on 10/9. Suspect poor oral intake related to constipation; recommend bowel regimen if patient amenable.     Medications and labs reviewed    Diet Order:   Diet Order       Diet regular Room service appropriate? Yes; Fluid consistency: Thin  Diet effective now              EDUCATION NEEDS:   Not appropriate for education at this time  Skin:  Skin Assessment: Reviewed RN Assessment  Last BM:  10/8  Height:   Ht Readings from Last 1 Encounters:  01/19/19 5\' 5"  (1.651 m)    Weight:   Wt Readings from Last 1 Encounters:  01/19/19 45.4 kg    Ideal Body Weight:  56.8 kg  BMI:  Body mass index is 16.66 kg/m.  Estimated Nutritional Needs:   Kcal:  1500-1700  Protein:  65-75 grams  Fluid:  >/= 1.5 L/day   [name], RD, LDN Clinical Nutrition [facility] Telephone [phone] After Hours/Weekend Pager: [phone]

## 2019-01-26 LAB — BASIC METABOLIC PANEL
Anion gap: 11 (ref 5–15)
BUN: 14 mg/dL (ref 8–23)
CO2: 25 mmol/L (ref 22–32)
Calcium: 9.1 mg/dL (ref 8.9–10.3)
Chloride: 100 mmol/L (ref 98–111)
Creatinine, Ser: 0.82 mg/dL (ref 0.44–1.00)
GFR calc Af Amer: >60 mL/min (ref >60)
GFR calc non Af Amer: >60 mL/min (ref >60)
Glucose, Bld: 100 mg/dL — ABNORMAL HIGH (ref 70–99)
Potassium: 3.4 mmol/L — ABNORMAL LOW (ref 3.5–5.1)
Sodium: 136 mmol/L (ref 135–145)

## 2019-01-26 LAB — HEMOGLOBIN AND HEMATOCRIT, BLOOD
HCT: 32.8 % — ABNORMAL LOW (ref 36.0–46.0)
Hemoglobin: 10.9 g/dL — ABNORMAL LOW (ref 12.0–15.0)

## 2019-01-26 MED ORDER — POTASSIUM CHLORIDE 20 MEQ/15ML (10%) PO SOLN: 40.0000 meq | Freq: Every day | ORAL | Status: DC

## 2019-01-26 MED ORDER — POTASSIUM CHLORIDE 20 MEQ/15ML (10%) PO SOLN
40.0000 meq | Freq: Two times a day (BID) | ORAL | Status: AC
  Administered 2019-01-26 – 2019-01-27 (×2): 40 meq via ORAL
  Filled 2019-01-26 (×2): qty 30

## 2019-01-26 NOTE — Progress Notes (Signed)
PROGRESS NOTE    Shelly Cervantes  [id number] DOB: 1935-06-19 DOA: 01/18/2019 PCP: [name], MD   Brief Narrative:  HPI on 01/18/2019 by Dr. Mitzi Hansen Shelly Cervantes is a 84 y.o. female with medical history significant for dementia, breast cancer status post definitive surgery, hypertension, and anxiety, now presenting to the emergency department for evaluation of left hip pain after a fall.  Patient's husband is at the bedside and has been concerned about patient's recent gait difficulty with frequent falls, reporting that this is the fourth fall she has had in the past few weeks.  She had not been complaining of anything leading up to this and seemed to be having an uneventful day.  Patient denies losing consciousness or experiencing any chest pain or lightheadedness prior to the fall.  Her husband heard her yell from another room, found her on the floor complaining of severe left hip pain, and called EMS.  There has not been any recent cough, shortness of breath, fevers, or chest pain.  Interim history Patient admitted for acute nondisplaced left intertrochanteric femur fracture.  Orthopedic surgery consulted and appreciated, underwent IM fixation of the left hip on 01/19/2019.  On 01/21/2019, patient had an acute episode of left-sided facial droop, code stroke was called and neurology consulted.  Imaging has been negative.  Patient currently pending SNF placement. Assessment & Plan   Left hip fracture -Secondary to fall -Surgery consulted and appreciated, status post IM fixation on 01/19/2019 with Dr. Stann Mainland -Continue pain control -PT/OT recommending SNF- pending insurance auth  Left facial droop -Code stroke called on 01/21/2019 -CT head showed no evidence of acute intracranial abnormality -CTA head and neck no emergent large vessel occlusion -MRI brain showed no acute or cranial normality -Echocardiogram EF 60 to 65%, consistent with impaired relaxation pattern LV diastolic  filling -Neurology consulted and appreciated, did not feel this was indicative of stroke or TIA -Continue aspirin daily  Essential hypertension -Continue lisinopril, amlodipine  Alzheimer's dementia -Continue Namenda, Aricept  Frequent falls -CT head revealed ventriculomegaly.  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  Recommended outpatient referral to neurosurgery for continued work-up and possible need for by lumbar puncture for definitive diagnosis.  Depression/anxiety -Continue Wellbutrin, Atarax as needed, Zoloft, trazodone  Prolonged QT interval -Improving -EKG in the emergency department revealed a QTC of 524, on repeat EKG on 01/21/2019 showing normal sinus rhythm no significant ST changes, QTC of 449.  Hypokalemia -Despite continued replacement, potassium still 3.4 -Continue to replace and monitor  DVT Prophylaxis  Lovenox  Code Status: Full  Family Communication: None at bedside  Disposition Plan: Admitted. Pending SNF placement.  COVID testing negative  Consultants Orthopedics Neurology  Procedures  INTRAMEDULLARY (IM) NAIL FEMORAL (Left) Echocardiogram  Antibiotics   Anti-infectives (From admission, onward)   Start     Dose/Rate Route Frequency Ordered Stop   01/19/19 1200  ceFAZolin (ANCEF) IVPB 2g/100 mL premix  Status:  Discontinued     2 g 200 mL/hr over 30 Minutes Intravenous To Short Stay 01/19/19 0149 01/19/19 1717   01/19/19 0600  vancomycin (VANCOCIN) IVPB 1000 mg/200 mL premix     1,000 mg 200 mL/hr over 60 Minutes Intravenous On call to O.R. 01/18/19 2035 01/19/19 1516      Subjective:   Shelly Cervantes seen and examined today.  Patient with dementia.  No complaints today.   Objective:   Vitals:   01/25/19 1944 01/26/19 0354 01/26/19 0920 01/26/19  0921  BP: (!) 149/71 (!) 147/68 (!) 147/68 (!) 147/68  Pulse: 72 71    Resp: 18 16    Temp: 97.8 F (36.6 C) 98.8 F (37.1 C)     TempSrc: Oral Oral    SpO2: 96% 94%    Weight:      Height:        Intake/Output Summary (Last 24 hours) at 01/26/2019 1412 Last data filed at 01/26/2019 1033 Gross per 24 hour  Intake 580 ml  Output 400 ml  Net 180 ml   Filed Weights   01/19/19 0135 01/19/19 1414  Weight: 45.4 kg 45.4 kg   Exam  General: Well developed, well nourished, elderly, NAD  HEENT: NCAT, mucous membranes moist.   Cardiovascular: S1 S2 auscultated, RRR, no murmur  Respiratory: Clear to auscultation bilaterally with equal chest rise  Abdomen: Soft, nontender, nondistended, + bowel sounds  Extremities: warm dry without cyanosis clubbing or edema  Neuro: AAOx1, nonfocal, dementia  Psych: Pleasantly confused  Data Reviewed: I have personally reviewed following labs and imaging studies  CBC: Recent Labs  Lab 01/21/19 0233 01/22/19 0655 01/24/19 0414 01/26/19 0345  WBC 9.1 7.5 6.6  --   HGB 9.7* 9.9* 10.1* 10.9*  HCT 28.5* 30.0* 29.7* 32.8*  MCV 89.1 91.2 88.1  --   PLT 193 228 238  --    Basic Metabolic Panel: Recent Labs  Lab 01/20/19 0459 01/21/19 0233 01/22/19 0655 01/23/19 0205 01/24/19 0414 01/24/19 1303 01/25/19 0732 01/26/19 0345  NA 136 136 141 136 135  --  136 136  K 3.3* 3.5 3.3* 3.8 3.3*  --  3.3* 3.4*  CL 102 103 107 101 100  --  100 100  CO2 23 21* 18* 24 22  --  23 25  GLUCOSE 97 84 77 100* 91  --  87 100*  BUN 13 21 16 12 11   --  12 14  CREATININE 0.95 0.82 0.87 0.74 0.70  --  0.78 0.82  CALCIUM 9.1 8.9 8.9 8.9 9.1  --  9.1 9.1  MG 2.0 2.0  --  1.7  --  1.7  --   --    GFR: Estimated Creatinine Clearance: 38.6 mL/min (by C-G formula based on SCr of 0.82 mg/dL). Liver Function Tests: No results for input(s): AST, ALT, ALKPHOS, BILITOT, PROT, ALBUMIN in the last 168 hours. No results for input(s): LIPASE, AMYLASE in the last 168 hours. No results for input(s): AMMONIA in the last 168 hours. Coagulation Profile: No results for input(s): INR, PROTIME in the  last 168 hours. Cardiac Enzymes: No results for input(s): CKTOTAL, CKMB, CKMBINDEX, TROPONINI in the last 168 hours. BNP (last 3 results) No results for input(s): PROBNP in the last 8760 hours. HbA1C: No results for input(s): HGBA1C in the last 72 hours. CBG: Recent Labs  Lab 01/21/19 0842  GLUCAP 82   Lipid Profile: No results for input(s): CHOL, HDL, LDLCALC, TRIG, CHOLHDL, LDLDIRECT in the last 72 hours. Thyroid Function Tests: No results for input(s): TSH, T4TOTAL, FREET4, T3FREE, THYROIDAB in the last 72 hours. Anemia Panel: No results for input(s): VITAMINB12, FOLATE, FERRITIN, TIBC, IRON, RETICCTPCT in the last 72 hours. Urine analysis: No results found for: COLORURINE, APPEARANCEUR, LABSPEC, PHURINE, GLUCOSEU, HGBUR, BILIRUBINUR, KETONESUR, PROTEINUR, UROBILINOGEN, NITRITE, LEUKOCYTESUR Sepsis Labs: @LABRCNTIP (procalcitonin:4,lacticidven:4)  ) Recent Results (from the past 240 hour(s))  SARS Coronavirus 2 by RT PCR (hospital order, performed in [facility] hospital lab) Nasopharyngeal Nasopharyngeal Swab     Status: None  Collection Time: 01/18/19  7:17 PM   Specimen: Nasopharyngeal Swab  Result Value Ref Range Status   SARS Coronavirus 2 NEGATIVE NEGATIVE Final    Comment: (NOTE) If result is NEGATIVE SARS-CoV-2 target nucleic acids are NOT DETECTED. The SARS-CoV-2 RNA is generally detectable in upper and lower  respiratory specimens during the acute phase of infection. The lowest  concentration of SARS-CoV-2 viral copies this assay can detect is 250  copies / mL. A negative result does not preclude SARS-CoV-2 infection  and should not be used as the sole basis for treatment or other  patient management decisions.  A negative result may occur with  improper specimen collection / handling, submission of specimen other  than nasopharyngeal swab, presence of viral mutation(s) within the  areas targeted by this assay, and inadequate number of viral copies  (<250  copies / mL). A negative result must be combined with clinical  observations, patient history, and epidemiological information. If result is POSITIVE SARS-CoV-2 target nucleic acids are DETECTED. The SARS-CoV-2 RNA is generally detectable in upper and lower  respiratory specimens dur ing the acute phase of infection.  Positive  results are indicative of active infection with SARS-CoV-2.  Clinical  correlation with patient history and other diagnostic information is  necessary to determine patient infection status.  Positive results do  not rule out bacterial infection or co-infection with other viruses. If result is PRESUMPTIVE POSTIVE SARS-CoV-2 nucleic acids MAY BE PRESENT.   A presumptive positive result was obtained on the submitted specimen  and confirmed on repeat testing.  While 2019 novel coronavirus  (SARS-CoV-2) nucleic acids may be present in the submitted sample  additional confirmatory testing may be necessary for epidemiological  and / or clinical management purposes  to differentiate between  SARS-CoV-2 and other Sarbecovirus currently known to infect humans.  If clinically indicated additional testing with an alternate test  methodology [phone]) is advised. The SARS-CoV-2 RNA is generally  detectable in upper and lower respiratory sp ecimens during the acute  phase of infection. The expected result is Negative. Fact Sheet for Patients:  StrictlyIdeas.no Fact Sheet for Healthcare Providers: BankingDealers.co.za This test is not yet approved or cleared by the [place] FDA and has been authorized for detection and/or diagnosis of SARS-CoV-2 by FDA under an Emergency Use Authorization (EUA).  This EUA will remain in effect (meaning this test can be used) for the duration of the COVID-19 declaration under Section 564(b)(1) of the Act, 21 U.S.C. section 360bbb-3(b)(1), unless the authorization is terminated or revoked  sooner. Performed at [facility], [place] [address], [place], [place] [address]   Surgical pcr screen     Status: None   Collection Time: 01/18/19 10:14 PM   Specimen: Nasal Mucosa; Nasal Swab  Result Value Ref Range Status   MRSA, PCR NEGATIVE NEGATIVE Final   Staphylococcus aureus NEGATIVE NEGATIVE Final    Comment: (NOTE) The Xpert SA Assay (FDA approved for NASAL specimens in patients 60 years of age and older), is one component of a comprehensive surveillance program. It is not intended to diagnose infection nor to guide or monitor treatment. Performed at [facility], [place] [address], [place], [place] [address]   Novel Coronavirus, NAA (Hosp order, Send-out to Ref Lab; TAT 18-24 hrs     Status: None   Collection Time: 01/23/19  9:21 AM   Specimen: Nasopharyngeal Swab; Respiratory  Result Value Ref Range Status   SARS-CoV-2, NAA NOT DETECTED NOT DETECTED Final  Comment: (NOTE) This nucleic acid amplification test was developed and its performance characteristics determined by [employer]. Nucleic acid amplification tests include PCR and TMA. This test has not been FDA cleared or approved. This test has been authorized by FDA under an Emergency Use Authorization (EUA). This test is only authorized for the duration of time the declaration that circumstances exist justifying the authorization of the emergency use of in vitro diagnostic tests for detection of SARS-CoV-2 virus and/or diagnosis of COVID-19 infection under section 564(b)(1) of the Act, 21 U.S.C. [id number]) (1), unless the authorization is terminated or revoked sooner. When diagnostic testing is negative, the possibility of a false negative result should be considered in the context of a patient's recent exposures and the presence of clinical signs and symptoms consistent with COVID-19. An individual without symptoms of COVID- 19 and who is not shedding SARS-CoV-2 vi rus would expect to have  a negative (not detected) result in this assay. Performed At: [facility] [address] [place], [place] [id number] Rush Farmer MD [id number]    [place]  Final    Comment: Performed at [facility], [place] [address], [place],  [address]      Radiology Studies: No results found.   Scheduled Meds: . amLODipine  5 mg Oral Daily  . aspirin  81 mg Oral QHS  . buPROPion  150 mg Oral Daily  . docusate sodium  100 mg Oral BID  . donepezil  5 mg Oral QHS  . enoxaparin (LOVENOX) injection  40 mg Subcutaneous Q24H  . feeding supplement (ENSURE ENLIVE)  237 mL Oral BID BM  . fluticasone  1 spray Each Nare BID  . lisinopril  40 mg Oral Daily  . megestrol  20 mg Oral Daily  . memantine  10 mg Oral Daily  . pantoprazole  40 mg Oral Daily  . potassium chloride  40 mEq Oral BID  . sertraline  100 mg Oral Daily  . timolol  1 drop Both Eyes Daily  . traZODone  50 mg Oral QHS   Continuous Infusions: . methocarbamol (ROBAXIN) IV       LOS: 8 days   Time Spent in minutes   30 minutes  Greidys Deland D.O. on 01/26/2019 at 2:12 PM  Between 7am to 7pm - Please see pager noted on [email or web address]  After 7pm go to [email or web address]  And look for the night coverage person covering for me after hours  [facility] Office  [phone]

## 2019-01-26 NOTE — Progress Notes (Signed)
The patient continues to be pleasantly confused.  The husband has been at the bedside.

## 2019-01-27 LAB — BASIC METABOLIC PANEL
Anion gap: 7 (ref 5–15)
BUN: 13 mg/dL (ref 8–23)
CO2: 25 mmol/L (ref 22–32)
Calcium: 9.4 mg/dL (ref 8.9–10.3)
Chloride: 102 mmol/L (ref 98–111)
Creatinine, Ser: 0.84 mg/dL (ref 0.44–1.00)
GFR calc Af Amer: >60 mL/min (ref >60)
GFR calc non Af Amer: >60 mL/min (ref >60)
Glucose, Bld: 97 mg/dL (ref 70–99)
Potassium: 4.2 mmol/L (ref 3.5–5.1)
Sodium: 134 mmol/L — ABNORMAL LOW (ref 135–145)

## 2019-01-27 MED ORDER — ENSURE ENLIVE PO LIQD: 237.0000 mL | Freq: Two times a day (BID) | ORAL | Status: AC

## 2019-01-27 MED ORDER — AMLODIPINE BESYLATE 5 MG PO TABS: 5.0000 mg | ORAL_TABLET | Freq: Every day | ORAL | Status: AC

## 2019-01-27 MED ORDER — DOCUSATE SODIUM 100 MG PO CAPS: 100.0000 mg | ORAL_CAPSULE | Freq: Two times a day (BID) | ORAL | Status: AC

## 2019-01-27 NOTE — TOC Transition Note (Signed)
Transition of Care [facility]) - CM/SW Discharge Note   Patient Details  Name: [name] MRN: [id number] Date of Birth: 1935-09-28  Transition of [facility]) CM/SW Contact:  Bary Castilla, LCSW Phone Number: [phone] 01/27/2019, 10:57 AM   Clinical Narrative:    Clinical Social Worker facilitated patient discharge including contacting patient family and facility to confirm patient discharge plans.  Clinical information faxed to facility and family agreeable with plan.  CSW arranged ambulance transport via [place] to  [facility].  RN to call report [phone] prior to discharge.  Clinical Social Worker will sign off for now as social work intervention is no longer needed. Please consult [place] again if new need arises.   Final next level of care: Skilled Nursing Facility Barriers to Discharge: No Barriers Identified   Patient Goals and CMS Choice   CMS Medicare.gov Compare Post Acute Care list provided to:: Patient Represenative (must comment)(daughter Terri) Choice offered to / list presented to : Adult Children  Discharge Placement              Patient chooses bed at: [facility] Patient to be transferred to facility by: [facility] Name of family member notified: Daughter Con Memos Patient and family notified of of transfer: 01/27/19  Discharge Plan and Services     Post Acute Care Choice: [facility]                               Social Determinants of Health (SDOH) Interventions     Readmission Risk Interventions No flowsheet data found.

## 2019-01-27 NOTE — Plan of Care (Signed)
  Problem: Education: Goal: Knowledge of General Education information will improve Description: Including pain rating scale, medication(s)/side effects and non-pharmacologic comfort measures Outcome: Progressing   Problem: Clinical Measurements: Goal: Will remain free from infection Outcome: Progressing   Problem: Coping: Goal: Level of anxiety will decrease Outcome: Progressing   Problem: Pain Managment: Goal: General experience of comfort will improve Outcome: Progressing   Problem: Safety: Goal: Ability to remain free from injury will improve Outcome: Progressing

## 2019-01-27 NOTE — Discharge Summary (Signed)
Physician Discharge Summary  Shelly Cervantes [id number] DOB: Oct 02, 1935 DOA: 01/18/2019  PCP: [name], MD  Admit date: 01/18/2019 Discharge date: 01/27/2019  Time spent: 45 minutes  Recommendations for Outpatient Follow-up:  Patient will be discharged to skilled nursing facility, continue physical and occupational therapy.  Patient will need to follow up with primary care provider within one week of discharge, repeat BMP.  Follow up with orthopedic surgery, Dr. Stann Mainland, in 1-2 weeks. Patient should continue medications as prescribed.  Patient should follow a heart healthy diet.   Discharge Diagnoses:  Left hip fracture Left facial droop Essential hypertension Alzheimer's dementia Frequent falls Depression/anxiety Prolonged QT interval Hypokalemia  Discharge Condition: Stable  Diet recommendation: heart healthy  Filed Weights   01/19/19 0135 01/19/19 1414  Weight: 45.4 kg 45.4 kg    History of present illness:  on 01/18/2019 by Dr. Alice Rieger Huffis an 83 y.o.femalewith medical history significant fordementia, breast cancer status post definitive surgery, hypertension, and anxiety, now presenting to the emergency department for evaluation of left hip pain after a fall. Patient's husband is at the bedside and has been concerned about patient's recent gait difficulty with frequent falls, reporting that this is the fourth fall she has had in the past few weeks. She had not been complaining of anything leading up to this and seemed to be having an uneventful day. Patient denies losing consciousness or experiencing any chest pain or lightheadedness prior to the fall. Her husband heard her yell from another room, found her on the floor complaining of severe left hip pain, and called EMS. There has not been any recent cough, shortness of breath, fevers, or chest pain.  Hospital Course:  Left hip fracture -Secondary to fall -Surgery consulted and  appreciated, status post IM fixation on 01/19/2019 with Dr. Stann Mainland -Continue pain control -PT/OT recommending SNF  Left facial droop -Code stroke called on 01/21/2019 -CT head showed no evidence of acute intracranial abnormality -CTA head and neck no emergent large vessel occlusion -MRI brain showed no acute or cranial normality -Echocardiogram EF 60 to 65%, consistent with impaired relaxation pattern LV diastolic filling -Neurology consulted and appreciated, did not feel this was indicative of stroke or TIA -Continue aspirin daily  Essential hypertension -Continue lisinopril, amlodipine  Alzheimer's dementia -Continue Namenda, Aricept -patient has not required any type of sedative or benzodiazapine during her hospitalization  Frequent falls -CT head revealed ventriculomegaly.  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  Recommended outpatient referral to neurosurgery for continued work-up and possible need for by lumbar puncture for definitive diagnosis.  Depression/anxiety -Continue Wellbutrin, Atarax as needed, Zoloft, trazodone  Prolonged QT interval -Improving -EKG in the emergency department revealed a QTC of 524, on repeat EKG on 01/21/2019 showing normal sinus rhythm no significant ST changes, QTC of 449.  Hypokalemia -Resolved with replacement -repeat BMP in one week  Consultants Orthopedics Neurology  Procedures  INTRAMEDULLARY (IM) NAIL FEMORAL (Left) Echocardiogram  Discharge Exam: Vitals:   01/27/19 0425 01/27/19 0754  BP: (!) 168/80 (!) 181/83  Pulse: 68 69  Resp: 16   Temp: 98.3 F (36.8 C) 98.2 F (36.8 C)  SpO2: 97%      General: Well developed, well nourished, elderly, NAD  HEENT: NCAT, mucous membranes moist.  Cardiovascular: S1 S2 auscultated, RRR, no murmur  Respiratory: Clear to auscultation bilaterally   Abdomen: Soft, nontender, nondistended, + bowel sounds  Extremities:  warm dry without cyanosis clubbing or edema  Neuro: AAOx1, nonfocal, dementia  Psych: Pleasantly confused  Discharge Instructions Discharge Instructions    Discharge instructions   Complete by: As directed    Patient will be discharged to skilled nursing facility, continue physical and occupational therapy.  Patient will need to follow up with primary care provider within one week of discharge, repeat BMP.  Follow up with orthopedic surgery, Dr. Stann Mainland, in 1-2 weeks. Patient should continue medications as prescribed.  Patient should follow a heart healthy diet.   Weight bearing as tolerated   Complete by: As directed    With walker     Allergies as of 01/27/2019      Reactions   Codeine Rash   Penicillins Rash   Did it involve swelling of the face/tongue/throat, SOB, or low BP? No Did it involve sudden or severe rash/hives, skin peeling, or any reaction on the inside of your mouth or nose? No Did you need to seek medical attention at a hospital or doctor's office? No When did it last happen?Many years ago. If all above answers are NO, may proceed with cephalosporin use.      Medication List    STOP taking these medications   naproxen sodium 220 MG tablet Commonly known as: ALEVE     TAKE these medications   amLODipine 5 MG tablet Commonly known as: NORVASC Take 1 tablet (5 mg total) by mouth daily.   aspirin 81 MG tablet Take 81 mg by mouth at bedtime.   buPROPion 150 MG 24 hr tablet Commonly known as: WELLBUTRIN XL Take 150 mg by mouth daily.   cholecalciferol 25 MCG (1000 UT) tablet Commonly known as: VITAMIN D3 Take 1 tablet (1,000 Units total) by mouth daily.   docusate sodium 100 MG capsule Commonly known as: COLACE Take 1 capsule (100 mg total) by mouth 2 (two) times daily.   donepezil 5 MG tablet Commonly known as: ARICEPT Take 5 mg by mouth at bedtime.   enoxaparin 30 MG/0.3ML injection Commonly known as: LOVENOX Inject 0.3 mLs (30 mg total)  into the skin daily.   feeding supplement (ENSURE ENLIVE) Liqd Take 237 mLs by mouth 2 (two) times daily between meals.   fluticasone 50 MCG/ACT nasal spray Commonly known as: FLONASE Place 1 spray into both nostrils 2 (two) times daily.   hydrOXYzine 25 MG capsule Commonly known as: VISTARIL Take 25 mg by mouth every 8 (eight) hours as needed for agitation.   lisinopril 20 MG tablet Commonly known as: ZESTRIL Take 20 mg by mouth 2 (two) times daily.   megestrol 20 MG tablet Commonly known as: MEGACE Take 20 mg by mouth daily.   memantine 10 MG tablet Commonly known as: NAMENDA Take 10 mg by mouth daily.   MULTI-VITAMIN DAILY PO Take 1 tablet by mouth daily.   omeprazole 20 MG capsule Commonly known as: PRILOSEC Take 20 mg by mouth daily as needed (heartburn).   sertraline 100 MG tablet Commonly known as: ZOLOFT Take 100 mg by mouth daily.   timolol 0.5 % ophthalmic solution Commonly known as: TIMOPTIC Place 1 drop into both eyes daily.   traZODone 50 MG tablet Commonly known as: DESYREL Take 50 mg by mouth at bedtime.            Discharge Care Instructions  (From admission, onward)         Start     Ordered   01/21/19 0000  Weight bearing as tolerated    Comments: With walker   01/21/19 [phone]  Allergies  Allergen Reactions   Codeine Rash   Penicillins Rash    Did it involve swelling of the face/tongue/throat, SOB, or low BP? No Did it involve sudden or severe rash/hives, skin peeling, or any reaction on the inside of your mouth or nose? No Did you need to seek medical attention at a hospital or doctor's office? No When did it last happen?Many years ago. If all above answers are NO, may proceed with cephalosporin use.     Contact information for follow-up providers    Stann Mainland Elly Modena, MD.   Specialty: Orthopedic Surgery Contact information: [address] [place] [address] [place] [address] [id number]        [name], MD. Schedule an appointment as soon as possible for a visit in 1 week(s).   Specialty: Family Medicine Why: Hospital follow up Contact information: [place] [address] [phone]            Contact information for after-discharge care    Destination    [facility] .   Service: Skilled Nursing Contact information: [address] [place] [phone]                   The results of significant diagnostics from this hospitalization (including imaging, microbiology, ancillary and laboratory) are listed below for reference.    Significant Diagnostic Studies: Ct Angio Head W Or Wo Contrast  Result Date: 01/21/2019 CLINICAL DATA:  Left-sided weakness and facial droop. EXAM: CT ANGIOGRAPHY HEAD AND NECK CT PERFUSION BRAIN TECHNIQUE: Multidetector CT imaging of the head and neck was performed using the standard protocol during bolus administration of intravenous contrast. Multiplanar CT image reconstructions and MIPs were obtained to evaluate the vascular anatomy. Carotid stenosis measurements (when applicable) are obtained utilizing NASCET criteria, using the distal internal carotid diameter as the denominator. Multiphase CT imaging of the brain was performed following IV bolus contrast injection. Subsequent parametric perfusion maps were calculated using RAPID software. CONTRAST:  168mL OMNIPAQUE IOHEXOL 350 MG/ML SOLN COMPARISON:  None. FINDINGS: CTA NECK FINDINGS Aortic arch: Standard 3 vessel aortic arch with mild atherosclerotic plaque. Calcified plaque at both subclavian artery origins without significant stenosis. Right carotid system: Patent with small volume calcified plaque at the carotid bifurcation. No evidence of significant stenosis or dissection. Tortuous distal cervical ICA. Left carotid system: Patent with small volume calcified plaque at the carotid bifurcation. No evidence of  significant stenosis or dissection. Vertebral arteries: Patent and codominant without evidence of stenosis or dissection. Skeleton: Moderately advanced multilevel cervical facet arthrosis with trace multilevel anterolisthesis. Other neck: No evidence of cervical lymphadenopathy or mass. Upper chest: Minimal scarring in the lung apices. Review of the MIP images confirms the above findings CTA HEAD FINDINGS Anterior circulation: The internal carotid arteries are patent from skull base to carotid termini with mild atherosclerotic plaque bilaterally not resulting in significant stenosis. ACAs and MCAs are patent without evidence of proximal branch occlusion or significant proximal stenosis. No aneurysm is identified. Posterior circulation: The intracranial vertebral arteries are patent to the basilar with minimal nonstenotic plaque bilaterally. A patent right PICA and bilateral SCAis are visualized. AICAs and a left PICA are not clearly identified. The basilar artery is widely patent with a small fenestration incidentally noted proximally. There is slight fusiform enlargement of the distal basilar artery at the PCA and SCA origins without a saccular aneurysm. Posterior communicating arteries are diminutive or absent. Both PCAs are patent with branch  vessel irregularity bilaterally. Additionally, there are severe right PCA stenoses involving the P2 and proximal P3 segments, and there is a mild proximal left P2 stenosis. No aneurysm is identified. Venous sinuses: Patent. Anatomic variants: None. Review of the MIP images confirms the above findings CT Brain Perfusion Findings: ASPECTS: 10 CBF (<30%) Volume: 44mL Perfusion (Tmax>6.0s) volume: 30mL Mismatch Volume: n/a Infarction Location: n/a IMPRESSION: 1. No emergent large vessel occlusion. 2. Intracranial atherosclerosis including severe right P2 and proximal P3 stenoses. 3. Mild cervical carotid artery atherosclerosis without stenosis. 4. Widely patent vertebral arteries.  5. Negative CTP. 6.  Aortic Atherosclerosis (ICD10-I70.0). These results were communicated to Dr. Rory Percy at 9:55 am on 01/21/2019 by text page via the [facility] messaging system. Electronically Signed   By: Logan Bores M.D.   On: 01/21/2019 10:20   Dg Chest 1 View  Result Date: 01/18/2019 CLINICAL DATA:  Fall, left leg pain EXAM: CHEST  1 VIEW COMPARISON:  07/11/2006 FINDINGS: The heart size and mediastinal contours are within normal limits. Both lungs are clear. The visualized skeletal structures are unremarkable. IMPRESSION: No acute cardiopulmonary findings. Electronically Signed   By: Davina Poke M.D.   On: 01/18/2019 16:39   Dg Pelvis 1-2 Views  Result Date: 01/18/2019 CLINICAL DATA:  Fall.  Left leg pain. EXAM: PELVIS - 1-2 VIEW COMPARISON:  Pelvic and right hip x-rays dated December 25, 2018. FINDINGS: Acute largely nondisplaced fracture of the left intertrochanteric femur. More subacute appearing nondisplaced fractures of the right superior and inferior pubic rami, new since the prior study. No dislocation. The hip joint spaces are relatively preserved. Osteopenia. Soft tissues are unremarkable. IMPRESSION: 1. Acute nondisplaced left intertrochanteric femur fracture. 2. More subacute appearing nondisplaced fractures of the right superior and inferior pubic rami, new since the prior study. Electronically Signed   By: Titus Dubin M.D.   On: 01/18/2019 16:42   Ct Head Wo Contrast  Result Date: 01/18/2019 CLINICAL DATA:  Fall. EXAM: CT HEAD WITHOUT CONTRAST CT CERVICAL SPINE WITHOUT CONTRAST TECHNIQUE: Multidetector CT imaging of the head and cervical spine was performed following the standard protocol without intravenous contrast. Multiplanar CT image reconstructions of the cervical spine were also generated. COMPARISON:  CT paranasal sinuses dated December 20, 2017. FINDINGS: CT HEAD FINDINGS Brain: No evidence of acute infarction, hemorrhage, extra-axial collection or mass lesion/mass  effect. Mild generalized cerebral atrophy. Scattered moderate periventricular and subcortical white matter hypodensities are nonspecific, but favored to reflect chronic microvascular ischemic changes. Prominent lateral ventricles, greater than expected based on the degree of atrophy. Vascular: Calcified atherosclerosis at the skullbase. No hyperdense vessel. Skull: Normal. Negative for fracture or focal lesion. Sinuses/Orbits: No acute finding. Other: None. CT CERVICAL SPINE FINDINGS Alignment: No traumatic malalignment. Trace stepwise anterolisthesis from C3-C4 through C7-T1. Skull base and vertebrae: No acute fracture. No primary bone lesion or focal pathologic process. Soft tissues and spinal canal: No prevertebral fluid or swelling. No visible canal hematoma. Disc levels: Mild disc height loss at C5-C6. Moderate facet arthropathy throughout the cervical spine. Upper chest: Negative. Other: None. IMPRESSION: CT head: 1.  No acute intracranial abnormality. 2. Mild atrophy and chronic microvascular ischemic changes. 3. Prominent lateral ventricles, greater than expected based on the degree of atrophy. Correlate for normal pressure hydrocephalus. CT cervical spine: 1.  No acute cervical spine fracture. Electronically Signed   By: Titus Dubin M.D.   On: 01/18/2019 17:22   Ct Angio Neck W Or Wo Contrast  Result Date: 01/21/2019 CLINICAL DATA:  Left-sided weakness  and facial droop. EXAM: CT ANGIOGRAPHY HEAD AND NECK CT PERFUSION BRAIN TECHNIQUE: Multidetector CT imaging of the head and neck was performed using the standard protocol during bolus administration of intravenous contrast. Multiplanar CT image reconstructions and MIPs were obtained to evaluate the vascular anatomy. Carotid stenosis measurements (when applicable) are obtained utilizing NASCET criteria, using the distal internal carotid diameter as the denominator. Multiphase CT imaging of the brain was performed following IV bolus contrast injection.  Subsequent parametric perfusion maps were calculated using RAPID software. CONTRAST:  164mL OMNIPAQUE IOHEXOL 350 MG/ML SOLN COMPARISON:  None. FINDINGS: CTA NECK FINDINGS Aortic arch: Standard 3 vessel aortic arch with mild atherosclerotic plaque. Calcified plaque at both subclavian artery origins without significant stenosis. Right carotid system: Patent with small volume calcified plaque at the carotid bifurcation. No evidence of significant stenosis or dissection. Tortuous distal cervical ICA. Left carotid system: Patent with small volume calcified plaque at the carotid bifurcation. No evidence of significant stenosis or dissection. Vertebral arteries: Patent and codominant without evidence of stenosis or dissection. Skeleton: Moderately advanced multilevel cervical facet arthrosis with trace multilevel anterolisthesis. Other neck: No evidence of cervical lymphadenopathy or mass. Upper chest: Minimal scarring in the lung apices. Review of the MIP images confirms the above findings CTA HEAD FINDINGS Anterior circulation: The internal carotid arteries are patent from skull base to carotid termini with mild atherosclerotic plaque bilaterally not resulting in significant stenosis. ACAs and MCAs are patent without evidence of proximal branch occlusion or significant proximal stenosis. No aneurysm is identified. Posterior circulation: The intracranial vertebral arteries are patent to the basilar with minimal nonstenotic plaque bilaterally. A patent right PICA and bilateral SCAis are visualized. AICAs and a left PICA are not clearly identified. The basilar artery is widely patent with a small fenestration incidentally noted proximally. There is slight fusiform enlargement of the distal basilar artery at the PCA and SCA origins without a saccular aneurysm. Posterior communicating arteries are diminutive or absent. Both PCAs are patent with branch vessel irregularity bilaterally. Additionally, there are severe right PCA  stenoses involving the P2 and proximal P3 segments, and there is a mild proximal left P2 stenosis. No aneurysm is identified. Venous sinuses: Patent. Anatomic variants: None. Review of the MIP images confirms the above findings CT Brain Perfusion Findings: ASPECTS: 10 CBF (<30%) Volume: 24mL Perfusion (Tmax>6.0s) volume: 50mL Mismatch Volume: n/a Infarction Location: n/a IMPRESSION: 1. No emergent large vessel occlusion. 2. Intracranial atherosclerosis including severe right P2 and proximal P3 stenoses. 3. Mild cervical carotid artery atherosclerosis without stenosis. 4. Widely patent vertebral arteries. 5. Negative CTP. 6.  Aortic Atherosclerosis (ICD10-I70.0). These results were communicated to Dr. Rory Percy at 9:55 am on 01/21/2019 by text page via the [facility] messaging system. Electronically Signed   By: Logan Bores M.D.   On: 01/21/2019 10:20   Ct Cervical Spine Wo Contrast  Result Date: 01/18/2019 CLINICAL DATA:  Fall. EXAM: CT HEAD WITHOUT CONTRAST CT CERVICAL SPINE WITHOUT CONTRAST TECHNIQUE: Multidetector CT imaging of the head and cervical spine was performed following the standard protocol without intravenous contrast. Multiplanar CT image reconstructions of the cervical spine were also generated. COMPARISON:  CT paranasal sinuses dated December 20, 2017. FINDINGS: CT HEAD FINDINGS Brain: No evidence of acute infarction, hemorrhage, extra-axial collection or mass lesion/mass effect. Mild generalized cerebral atrophy. Scattered moderate periventricular and subcortical white matter hypodensities are nonspecific, but favored to reflect chronic microvascular ischemic changes. Prominent lateral ventricles, greater than expected based on the degree of atrophy. Vascular: Calcified atherosclerosis at  the skullbase. No hyperdense vessel. Skull: Normal. Negative for fracture or focal lesion. Sinuses/Orbits: No acute finding. Other: None. CT CERVICAL SPINE FINDINGS Alignment: No traumatic malalignment. Trace stepwise  anterolisthesis from C3-C4 through C7-T1. Skull base and vertebrae: No acute fracture. No primary bone lesion or focal pathologic process. Soft tissues and spinal canal: No prevertebral fluid or swelling. No visible canal hematoma. Disc levels: Mild disc height loss at C5-C6. Moderate facet arthropathy throughout the cervical spine. Upper chest: Negative. Other: None. IMPRESSION: CT head: 1.  No acute intracranial abnormality. 2. Mild atrophy and chronic microvascular ischemic changes. 3. Prominent lateral ventricles, greater than expected based on the degree of atrophy. Correlate for normal pressure hydrocephalus. CT cervical spine: 1.  No acute cervical spine fracture. Electronically Signed   By: Titus Dubin M.D.   On: 01/18/2019 17:22   Mr Brain Wo Contrast  Result Date: 01/21/2019 CLINICAL DATA:  Left facial droop. EXAM: MRI HEAD WITHOUT CONTRAST TECHNIQUE: Multiplanar, multiecho pulse sequences of the brain and surrounding structures were obtained without intravenous contrast. COMPARISON:  Head CT, CTA, and CTP 01/21/2019 and MRI 06/26/2012 FINDINGS: The study is mildly motion degraded. Brain: There is no evidence of acute infarct, intracranial hemorrhage, mass, midline shift, or extra-axial fluid collection. Patchy T2 hyperintensities in the cerebral white matter and pons have progressed from 2014 and are nonspecific but compatible with moderate chronic small vessel ischemic disease. Lateral ventriculomegaly, including prominent dilatation of the occipital and temporal horns, has greatly progressed from 2014, and there is milder dilatation of the third and fourth ventricles. Ventricular dilatation is out of proportion to the size of the sulci, and there is mild sulcal crowding at the vertex and narrowing of the callosal angle. Cerebral atrophy is present including severe bilateral mesial temporal lobe atrophy. Vascular: Major intracranial vascular flow voids are preserved. Skull and upper cervical  spine: Unremarkable bone marrow signal. Sinuses/Orbits: Bilateral cataract extraction. Paranasal sinuses and mastoid air cells are clear. Other: None. IMPRESSION: 1. No acute intracranial abnormality. 2. Moderate chronic small vessel ischemic disease and cerebral atrophy. 3. Progressive ventriculomegaly since 2014 which could reflect normal pressure hydrocephalus in the appropriate clinical setting. Electronically Signed   By: Logan Bores M.D.   On: 01/21/2019 14:34   Ct Cerebral Perfusion W Contrast  Result Date: 01/21/2019 CLINICAL DATA:  Left-sided weakness and facial droop. EXAM: CT ANGIOGRAPHY HEAD AND NECK CT PERFUSION BRAIN TECHNIQUE: Multidetector CT imaging of the head and neck was performed using the standard protocol during bolus administration of intravenous contrast. Multiplanar CT image reconstructions and MIPs were obtained to evaluate the vascular anatomy. Carotid stenosis measurements (when applicable) are obtained utilizing NASCET criteria, using the distal internal carotid diameter as the denominator. Multiphase CT imaging of the brain was performed following IV bolus contrast injection. Subsequent parametric perfusion maps were calculated using RAPID software. CONTRAST:  138mL OMNIPAQUE IOHEXOL 350 MG/ML SOLN COMPARISON:  None. FINDINGS: CTA NECK FINDINGS Aortic arch: Standard 3 vessel aortic arch with mild atherosclerotic plaque. Calcified plaque at both subclavian artery origins without significant stenosis. Right carotid system: Patent with small volume calcified plaque at the carotid bifurcation. No evidence of significant stenosis or dissection. Tortuous distal cervical ICA. Left carotid system: Patent with small volume calcified plaque at the carotid bifurcation. No evidence of significant stenosis or dissection. Vertebral arteries: Patent and codominant without evidence of stenosis or dissection. Skeleton: Moderately advanced multilevel cervical facet arthrosis with trace multilevel  anterolisthesis. Other neck: No evidence of cervical lymphadenopathy or mass. Upper  chest: Minimal scarring in the lung apices. Review of the MIP images confirms the above findings CTA HEAD FINDINGS Anterior circulation: The internal carotid arteries are patent from skull base to carotid termini with mild atherosclerotic plaque bilaterally not resulting in significant stenosis. ACAs and MCAs are patent without evidence of proximal branch occlusion or significant proximal stenosis. No aneurysm is identified. Posterior circulation: The intracranial vertebral arteries are patent to the basilar with minimal nonstenotic plaque bilaterally. A patent right PICA and bilateral SCAis are visualized. AICAs and a left PICA are not clearly identified. The basilar artery is widely patent with a small fenestration incidentally noted proximally. There is slight fusiform enlargement of the distal basilar artery at the PCA and SCA origins without a saccular aneurysm. Posterior communicating arteries are diminutive or absent. Both PCAs are patent with branch vessel irregularity bilaterally. Additionally, there are severe right PCA stenoses involving the P2 and proximal P3 segments, and there is a mild proximal left P2 stenosis. No aneurysm is identified. Venous sinuses: Patent. Anatomic variants: None. Review of the MIP images confirms the above findings CT Brain Perfusion Findings: ASPECTS: 10 CBF (<30%) Volume: 33mL Perfusion (Tmax>6.0s) volume: 47mL Mismatch Volume: n/a Infarction Location: n/a IMPRESSION: 1. No emergent large vessel occlusion. 2. Intracranial atherosclerosis including severe right P2 and proximal P3 stenoses. 3. Mild cervical carotid artery atherosclerosis without stenosis. 4. Widely patent vertebral arteries. 5. Negative CTP. 6.  Aortic Atherosclerosis (ICD10-I70.0). These results were communicated to Dr. Rory Percy at 9:55 am on 01/21/2019 by text page via the [facility] messaging system. Electronically Signed   By: Logan Bores M.D.   On: 01/21/2019 10:20   Dg C-arm 1-60 Min  Result Date: 01/19/2019 CLINICAL DATA:  The patient suffered a left intertrochanteric fracture in a fall 01/18/2019. Initial encounter. EXAM: LEFT FEMUR 2 VIEWS; DG C-ARM 1-60 MIN COMPARISON:  Plain film of the pelvis 01/18/2019. FINDINGS: Three fluoroscopic spot views of the right hip demonstrate a screw in the femoral neck and short intramedullary nail with a single distal screw for fixation of an intertrochanteric fracture. Position and alignment are near anatomic. No acute abnormality. IMPRESSION: Intraoperative imaging for fixation of an intertrochanteric fracture. Electronically Signed   By: Inge Rise M.D.   On: 01/19/2019 17:52   Dg Femur Min 2 Views Left  Result Date: 01/19/2019 CLINICAL DATA:  The patient suffered a left intertrochanteric fracture in a fall 01/18/2019. Initial encounter. EXAM: LEFT FEMUR 2 VIEWS; DG C-ARM 1-60 MIN COMPARISON:  Plain film of the pelvis 01/18/2019. FINDINGS: Three fluoroscopic spot views of the right hip demonstrate a screw in the femoral neck and short intramedullary nail with a single distal screw for fixation of an intertrochanteric fracture. Position and alignment are near anatomic. No acute abnormality. IMPRESSION: Intraoperative imaging for fixation of an intertrochanteric fracture. Electronically Signed   By: Inge Rise M.D.   On: 01/19/2019 17:52   Dg Femur Min 2 Views Left  Result Date: 01/18/2019 CLINICAL DATA:  [place].  Left leg pain. EXAM: LEFT FEMUR 2 VIEWS COMPARISON:  None. FINDINGS: Nondisplaced intertrochanteric fracture of the left hip. No femoral shaft fracture. The knee joint is maintained. The visualized left hemipelvis is intact. IMPRESSION: Nondisplaced intertrochanteric fracture of the left hip. Electronically Signed   By: Marijo Sanes M.D.   On: 01/18/2019 16:39   Ct Head Code Stroke Wo Contrast  Result Date: 01/21/2019 CLINICAL DATA:  Code stroke.  Left-sided weakness  and facial droop. EXAM: CT HEAD WITHOUT CONTRAST TECHNIQUE: Contiguous axial  images were obtained from the base of the skull through the vertex without intravenous contrast. COMPARISON:  01/18/2019 FINDINGS: Brain: There is no evidence of acute infarct, intracranial hemorrhage, mass, midline shift, or extra-axial fluid collection. Moderate dilatation of the lateral ventricles, including of the temporal horns, is unchanged and out of proportion to the size of the sulci. There is sulcal effacement at the vertex, and there is narrowing of the callososeptal angle. Slight enlargement of the third and fourth ventricles is also unchanged. Patchy hypodensities in the cerebral white matter bilaterally are unchanged and nonspecific but compatible with moderate chronic small vessel ischemic disease. A punctate calcification is again seen in the right parietal lobe. Vascular: Calcified atherosclerosis at the skull base. No hyperdense vessel. Skull: No fracture or focal osseous lesion. Sinuses/Orbits: Visualized paranasal sinuses and mastoid air cells are clear. Bilateral cataract extraction is noted. Other: None. ASPECTS [facility] Stroke Program Early CT Score) - Ganglionic level infarction (caudate, lentiform nuclei, internal capsule, insula, M1-M3 cortex): 7 - Supraganglionic infarction (M4-M6 cortex): 3 Total score (0-10 with 10 being normal): 10 IMPRESSION: 1. No evidence of acute intracranial abnormality. 2. ASPECTS is 10. 3. Unchanged hydrocephalus. 4. Moderate chronic small vessel ischemic disease. These results were communicated to Dr. Rory Percy at 9:11 am on 01/21/2019 by text page via the [facility] messaging system. Electronically Signed   By: Logan Bores M.D.   On: 01/21/2019 09:12    Microbiology: Recent Results (from the past 240 hour(s))  SARS Coronavirus 2 by RT PCR (hospital order, performed in [facility] hospital lab) Nasopharyngeal Nasopharyngeal Swab     Status: None   Collection Time: 01/18/19  7:17 PM    Specimen: Nasopharyngeal Swab  Result Value Ref Range Status   SARS Coronavirus 2 NEGATIVE NEGATIVE Final    Comment: (NOTE) If result is NEGATIVE SARS-CoV-2 target nucleic acids are NOT DETECTED. The SARS-CoV-2 RNA is generally detectable in upper and lower  respiratory specimens during the acute phase of infection. The lowest  concentration of SARS-CoV-2 viral copies this assay can detect is 250  copies / mL. A negative result does not preclude SARS-CoV-2 infection  and should not be used as the sole basis for treatment or other  patient management decisions.  A negative result may occur with  improper specimen collection / handling, submission of specimen other  than nasopharyngeal swab, presence of viral mutation(s) within the  areas targeted by this assay, and inadequate number of viral copies  (<250 copies / mL). A negative result must be combined with clinical  observations, patient history, and epidemiological information. If result is POSITIVE SARS-CoV-2 target nucleic acids are DETECTED. The SARS-CoV-2 RNA is generally detectable in upper and lower  respiratory specimens dur ing the acute phase of infection.  Positive  results are indicative of active infection with SARS-CoV-2.  Clinical  correlation with patient history and other diagnostic information is  necessary to determine patient infection status.  Positive results do  not rule out bacterial infection or co-infection with other viruses. If result is PRESUMPTIVE POSTIVE SARS-CoV-2 nucleic acids MAY BE PRESENT.   A presumptive positive result was obtained on the submitted specimen  and confirmed on repeat testing.  While 2019 novel coronavirus  (SARS-CoV-2) nucleic acids may be present in the submitted sample  additional confirmatory testing may be necessary for epidemiological  and / or clinical management purposes  to differentiate between  SARS-CoV-2 and other Sarbecovirus currently known to infect humans.  If  clinically indicated additional testing with  an alternate test  methodology [phone]) is advised. The SARS-CoV-2 RNA is generally  detectable in upper and lower respiratory sp ecimens during the acute  phase of infection. The expected result is Negative. Fact Sheet for Patients:  StrictlyIdeas.no Fact Sheet for Healthcare Providers: BankingDealers.co.za This test is not yet approved or cleared by the [place] FDA and has been authorized for detection and/or diagnosis of SARS-CoV-2 by FDA under an Emergency Use Authorization (EUA).  This EUA will remain in effect (meaning this test can be used) for the duration of the COVID-19 declaration under Section 564(b)(1) of the Act, 21 U.S.C. section 360bbb-3(b)(1), unless the authorization is terminated or revoked sooner. Performed at [facility], [place] [address], [place], [place] [address]   Surgical pcr screen     Status: None   Collection Time: 01/18/19 10:14 PM   Specimen: Nasal Mucosa; Nasal Swab  Result Value Ref Range Status   MRSA, PCR NEGATIVE NEGATIVE Final   Staphylococcus aureus NEGATIVE NEGATIVE Final    Comment: (NOTE) The Xpert SA Assay (FDA approved for NASAL specimens in patients 16 years of age and older), is one component of a comprehensive surveillance program. It is not intended to diagnose infection nor to guide or monitor treatment. Performed at [facility], [place] [address], [place], [place] [address]   Novel Coronavirus, NAA (Hosp order, Send-out to Ref Lab; TAT 18-24 hrs     Status: None   Collection Time: 01/23/19  9:21 AM   Specimen: Nasopharyngeal Swab; Respiratory  Result Value Ref Range Status   SARS-CoV-2, NAA NOT DETECTED NOT DETECTED Final    Comment: (NOTE) This nucleic acid amplification test was developed and its performance characteristics determined by [employer]. Nucleic acid amplification tests include PCR and TMA. This  test has not been FDA cleared or approved. This test has been authorized by FDA under an Emergency Use Authorization (EUA). This test is only authorized for the duration of time the declaration that circumstances exist justifying the authorization of the emergency use of in vitro diagnostic tests for detection of SARS-CoV-2 virus and/or diagnosis of COVID-19 infection under section 564(b)(1) of the Act, 21 U.S.C. [id number]) (1), unless the authorization is terminated or revoked sooner. When diagnostic testing is negative, the possibility of a false negative result should be considered in the context of a patient's recent exposures and the presence of clinical signs and symptoms consistent with COVID-19. An individual without symptoms of COVID- 19 and who is not shedding SARS-CoV-2 vi rus would expect to have a negative (not detected) result in this assay. Performed At: [facility] [address] [place], [place] [id number] Rush Farmer MD [id number]    [place]  Final    Comment: Performed at [facility], [place] [address] [place], [place] [address]     Labs: Basic Metabolic Panel: Recent Labs  Lab 01/21/19 0233  01/23/19 0205 01/24/19 0414 01/24/19 1303 01/25/19 0732 01/26/19 0345 01/27/19 0303  NA 136   < > 136 135  --  136 136 134*  K 3.5   < > 3.8 3.3*  --  3.3* 3.4* 4.2  CL 103   < > 101 100  --  100 100 102  CO2 21*   < > 24 22  --  23 25 25   GLUCOSE 84   < > 100* 91  --  87 100* 97  BUN 21   < > 12 11  --  12  14 13  CREATININE 0.82   < > 0.74 0.70  --  0.78 0.82 0.84  CALCIUM 8.9   < > 8.9 9.1  --  9.1 9.1 9.4  MG 2.0  --  1.7  --  1.7  --   --   --    < > = values in this interval not displayed.   Liver Function Tests: No results for input(s): AST, ALT, ALKPHOS, BILITOT, PROT, ALBUMIN in the last 168 hours. No results for input(s): LIPASE, AMYLASE in the last 168 hours. No results for input(s): AMMONIA in the last  168 hours. CBC: Recent Labs  Lab 01/21/19 0233 01/22/19 0655 01/24/19 0414 01/26/19 0345  WBC 9.1 7.5 6.6  --   HGB 9.7* 9.9* 10.1* 10.9*  HCT 28.5* 30.0* 29.7* 32.8*  MCV 89.1 91.2 88.1  --   PLT 193 228 238  --    Cardiac Enzymes: No results for input(s): CKTOTAL, CKMB, CKMBINDEX, TROPONINI in the last 168 hours. BNP: BNP (last 3 results) No results for input(s): BNP in the last 8760 hours.  ProBNP (last 3 results) No results for input(s): PROBNP in the last 8760 hours.  CBG: Recent Labs  Lab 01/21/19 0842  GLUCAP 82       Signed:  Ane Conerly  [facility] 01/27/2019, 10:09 AM

## 2019-01-27 NOTE — Progress Notes (Signed)
Discharge paperwork and instructions given to [facility] for receiving facility. Report given to nurse at receiving facility. Pt not in distress and tolerated well.

## 2019-01-29 DIAGNOSIS — G308 Other Alzheimer's disease: Secondary | ICD-10-CM | POA: Diagnosis not present

## 2019-01-29 DIAGNOSIS — M9702XA Periprosthetic fracture around internal prosthetic left hip joint, initial encounter: Secondary | ICD-10-CM | POA: Diagnosis not present

## 2019-01-29 DIAGNOSIS — R296 Repeated falls: Secondary | ICD-10-CM | POA: Diagnosis not present

## 2019-01-29 DIAGNOSIS — M6281 Muscle weakness (generalized): Secondary | ICD-10-CM | POA: Diagnosis not present

## 2019-01-29 DIAGNOSIS — I10 Essential (primary) hypertension: Secondary | ICD-10-CM | POA: Diagnosis not present

## 2019-01-29 DIAGNOSIS — R29810 Facial weakness: Secondary | ICD-10-CM | POA: Diagnosis not present

## 2019-01-29 DIAGNOSIS — G4700 Insomnia, unspecified: Secondary | ICD-10-CM | POA: Diagnosis not present

## 2019-01-29 DIAGNOSIS — I4581 Long QT syndrome: Secondary | ICD-10-CM | POA: Diagnosis not present

## 2019-01-29 DIAGNOSIS — E876 Hypokalemia: Secondary | ICD-10-CM | POA: Diagnosis not present

## 2019-01-29 DIAGNOSIS — R69 Illness, unspecified: Secondary | ICD-10-CM | POA: Diagnosis not present

## 2019-01-31 DIAGNOSIS — R69 Illness, unspecified: Secondary | ICD-10-CM | POA: Diagnosis not present

## 2019-01-31 DIAGNOSIS — E7800 Pure hypercholesterolemia, unspecified: Secondary | ICD-10-CM | POA: Diagnosis not present

## 2019-01-31 DIAGNOSIS — C50919 Malignant neoplasm of unspecified site of unspecified female breast: Secondary | ICD-10-CM | POA: Diagnosis not present

## 2019-01-31 DIAGNOSIS — I10 Essential (primary) hypertension: Secondary | ICD-10-CM | POA: Diagnosis not present

## 2019-02-01 DIAGNOSIS — R296 Repeated falls: Secondary | ICD-10-CM | POA: Diagnosis not present

## 2019-02-01 DIAGNOSIS — I10 Essential (primary) hypertension: Secondary | ICD-10-CM | POA: Diagnosis not present

## 2019-02-01 DIAGNOSIS — D649 Anemia, unspecified: Secondary | ICD-10-CM | POA: Diagnosis not present

## 2019-02-01 DIAGNOSIS — E876 Hypokalemia: Secondary | ICD-10-CM | POA: Diagnosis not present

## 2019-02-01 DIAGNOSIS — E43 Unspecified severe protein-calorie malnutrition: Secondary | ICD-10-CM | POA: Diagnosis not present

## 2019-02-01 DIAGNOSIS — K5900 Constipation, unspecified: Secondary | ICD-10-CM | POA: Diagnosis not present

## 2019-02-01 DIAGNOSIS — J309 Allergic rhinitis, unspecified: Secondary | ICD-10-CM | POA: Diagnosis not present

## 2019-02-01 DIAGNOSIS — H409 Unspecified glaucoma: Secondary | ICD-10-CM | POA: Diagnosis not present

## 2019-02-01 DIAGNOSIS — M6281 Muscle weakness (generalized): Secondary | ICD-10-CM | POA: Diagnosis not present

## 2019-02-01 DIAGNOSIS — M80852D Other osteoporosis with current pathological fracture, left femur, subsequent encounter for fracture with routine healing: Secondary | ICD-10-CM | POA: Diagnosis not present

## 2019-02-02 DIAGNOSIS — G4700 Insomnia, unspecified: Secondary | ICD-10-CM | POA: Diagnosis not present

## 2019-02-02 DIAGNOSIS — G308 Other Alzheimer's disease: Secondary | ICD-10-CM | POA: Diagnosis not present

## 2019-02-02 DIAGNOSIS — R296 Repeated falls: Secondary | ICD-10-CM | POA: Diagnosis not present

## 2019-02-02 DIAGNOSIS — M6281 Muscle weakness (generalized): Secondary | ICD-10-CM | POA: Diagnosis not present

## 2019-02-02 DIAGNOSIS — I10 Essential (primary) hypertension: Secondary | ICD-10-CM | POA: Diagnosis not present

## 2019-02-02 DIAGNOSIS — R69 Illness, unspecified: Secondary | ICD-10-CM | POA: Diagnosis not present

## 2019-02-02 DIAGNOSIS — I4581 Long QT syndrome: Secondary | ICD-10-CM | POA: Diagnosis not present

## 2019-02-02 DIAGNOSIS — R29810 Facial weakness: Secondary | ICD-10-CM | POA: Diagnosis not present

## 2019-02-02 DIAGNOSIS — E876 Hypokalemia: Secondary | ICD-10-CM | POA: Diagnosis not present

## 2019-02-02 DIAGNOSIS — M9702XA Periprosthetic fracture around internal prosthetic left hip joint, initial encounter: Secondary | ICD-10-CM | POA: Diagnosis not present

## 2019-02-05 DIAGNOSIS — M6281 Muscle weakness (generalized): Secondary | ICD-10-CM | POA: Diagnosis not present

## 2019-02-05 DIAGNOSIS — E876 Hypokalemia: Secondary | ICD-10-CM | POA: Diagnosis not present

## 2019-02-05 DIAGNOSIS — G4700 Insomnia, unspecified: Secondary | ICD-10-CM | POA: Diagnosis not present

## 2019-02-05 DIAGNOSIS — I4581 Long QT syndrome: Secondary | ICD-10-CM | POA: Diagnosis not present

## 2019-02-05 DIAGNOSIS — R296 Repeated falls: Secondary | ICD-10-CM | POA: Diagnosis not present

## 2019-02-05 DIAGNOSIS — G308 Other Alzheimer's disease: Secondary | ICD-10-CM | POA: Diagnosis not present

## 2019-02-05 DIAGNOSIS — R69 Illness, unspecified: Secondary | ICD-10-CM | POA: Diagnosis not present

## 2019-02-05 DIAGNOSIS — M9702XA Periprosthetic fracture around internal prosthetic left hip joint, initial encounter: Secondary | ICD-10-CM | POA: Diagnosis not present

## 2019-02-05 DIAGNOSIS — I10 Essential (primary) hypertension: Secondary | ICD-10-CM | POA: Diagnosis not present

## 2019-02-05 DIAGNOSIS — R29810 Facial weakness: Secondary | ICD-10-CM | POA: Diagnosis not present

## 2019-02-06 ENCOUNTER — Inpatient Hospital Stay (HOSPITAL_COMMUNITY)
Admission: EM | Admit: 2019-02-06 | Discharge: 2019-02-11 | DRG: 177 | Disposition: A | Discharge status: Skilled Nursing Facility | Payer: Medicare HMO | Attending: Family Medicine | Admitting: Family Medicine

## 2019-02-06 ENCOUNTER — Encounter (HOSPITAL_COMMUNITY): Payer: Self-pay | Admitting: Emergency Medicine

## 2019-02-06 ENCOUNTER — Emergency Department (HOSPITAL_COMMUNITY): Payer: Medicare HMO

## 2019-02-06 ENCOUNTER — Other Ambulatory Visit: Payer: Self-pay

## 2019-02-06 DIAGNOSIS — R0902 Hypoxemia: Secondary | ICD-10-CM | POA: Diagnosis present

## 2019-02-06 DIAGNOSIS — F319 Bipolar disorder, unspecified: Secondary | ICD-10-CM | POA: Diagnosis present

## 2019-02-06 DIAGNOSIS — U071 COVID-19: Principal | ICD-10-CM | POA: Diagnosis present

## 2019-02-06 DIAGNOSIS — Z880 Allergy status to penicillin: Secondary | ICD-10-CM

## 2019-02-06 DIAGNOSIS — R0602 Shortness of breath: Secondary | ICD-10-CM | POA: Diagnosis not present

## 2019-02-06 DIAGNOSIS — I9589 Other hypotension: Secondary | ICD-10-CM

## 2019-02-06 DIAGNOSIS — E861 Hypovolemia: Secondary | ICD-10-CM | POA: Diagnosis not present

## 2019-02-06 DIAGNOSIS — R5381 Other malaise: Secondary | ICD-10-CM | POA: Diagnosis not present

## 2019-02-06 DIAGNOSIS — N179 Acute kidney failure, unspecified: Secondary | ICD-10-CM | POA: Diagnosis not present

## 2019-02-06 DIAGNOSIS — K219 Gastro-esophageal reflux disease without esophagitis: Secondary | ICD-10-CM | POA: Diagnosis present

## 2019-02-06 DIAGNOSIS — D61818 Other pancytopenia: Secondary | ICD-10-CM | POA: Diagnosis present

## 2019-02-06 DIAGNOSIS — Z7951 Long term (current) use of inhaled steroids: Secondary | ICD-10-CM

## 2019-02-06 DIAGNOSIS — J189 Pneumonia, unspecified organism: Secondary | ICD-10-CM | POA: Diagnosis present

## 2019-02-06 DIAGNOSIS — E872 Acidosis: Secondary | ICD-10-CM | POA: Diagnosis not present

## 2019-02-06 DIAGNOSIS — J1289 Other viral pneumonia: Secondary | ICD-10-CM | POA: Diagnosis present

## 2019-02-06 DIAGNOSIS — R636 Underweight: Secondary | ICD-10-CM | POA: Diagnosis present

## 2019-02-06 DIAGNOSIS — F419 Anxiety disorder, unspecified: Secondary | ICD-10-CM | POA: Diagnosis present

## 2019-02-06 DIAGNOSIS — R55 Syncope and collapse: Secondary | ICD-10-CM | POA: Diagnosis not present

## 2019-02-06 DIAGNOSIS — Z681 Body mass index (BMI) 19 or less, adult: Secondary | ICD-10-CM

## 2019-02-06 DIAGNOSIS — R69 Illness, unspecified: Secondary | ICD-10-CM | POA: Diagnosis not present

## 2019-02-06 DIAGNOSIS — H409 Unspecified glaucoma: Secondary | ICD-10-CM | POA: Diagnosis present

## 2019-02-06 DIAGNOSIS — Z7902 Long term (current) use of antithrombotics/antiplatelets: Secondary | ICD-10-CM

## 2019-02-06 DIAGNOSIS — Z853 Personal history of malignant neoplasm of breast: Secondary | ICD-10-CM

## 2019-02-06 DIAGNOSIS — J342 Deviated nasal septum: Secondary | ICD-10-CM | POA: Diagnosis present

## 2019-02-06 DIAGNOSIS — L8990 Pressure ulcer of unspecified site, unspecified stage: Secondary | ICD-10-CM | POA: Insufficient documentation

## 2019-02-06 DIAGNOSIS — G309 Alzheimer's disease, unspecified: Secondary | ICD-10-CM | POA: Diagnosis present

## 2019-02-06 DIAGNOSIS — Z885 Allergy status to narcotic agent status: Secondary | ICD-10-CM

## 2019-02-06 DIAGNOSIS — R404 Transient alteration of awareness: Secondary | ICD-10-CM | POA: Diagnosis not present

## 2019-02-06 DIAGNOSIS — R41841 Cognitive communication deficit: Secondary | ICD-10-CM | POA: Diagnosis not present

## 2019-02-06 DIAGNOSIS — R29810 Facial weakness: Secondary | ICD-10-CM | POA: Diagnosis present

## 2019-02-06 DIAGNOSIS — R1312 Dysphagia, oropharyngeal phase: Secondary | ICD-10-CM | POA: Diagnosis not present

## 2019-02-06 DIAGNOSIS — Z66 Do not resuscitate: Secondary | ICD-10-CM | POA: Diagnosis not present

## 2019-02-06 DIAGNOSIS — I10 Essential (primary) hypertension: Secondary | ICD-10-CM | POA: Diagnosis present

## 2019-02-06 DIAGNOSIS — L89322 Pressure ulcer of left buttock, stage 2: Secondary | ICD-10-CM | POA: Diagnosis not present

## 2019-02-06 DIAGNOSIS — Z7982 Long term (current) use of aspirin: Secondary | ICD-10-CM

## 2019-02-06 DIAGNOSIS — R262 Difficulty in walking, not elsewhere classified: Secondary | ICD-10-CM | POA: Diagnosis not present

## 2019-02-06 DIAGNOSIS — J324 Chronic pansinusitis: Secondary | ICD-10-CM | POA: Diagnosis present

## 2019-02-06 DIAGNOSIS — E7800 Pure hypercholesterolemia, unspecified: Secondary | ICD-10-CM | POA: Diagnosis present

## 2019-02-06 DIAGNOSIS — Z79899 Other long term (current) drug therapy: Secondary | ICD-10-CM

## 2019-02-06 DIAGNOSIS — M2550 Pain in unspecified joint: Secondary | ICD-10-CM | POA: Diagnosis not present

## 2019-02-06 DIAGNOSIS — I959 Hypotension, unspecified: Secondary | ICD-10-CM | POA: Diagnosis not present

## 2019-02-06 DIAGNOSIS — E876 Hypokalemia: Secondary | ICD-10-CM | POA: Diagnosis not present

## 2019-02-06 DIAGNOSIS — F0280 Dementia in other diseases classified elsewhere without behavioral disturbance: Secondary | ICD-10-CM | POA: Diagnosis present

## 2019-02-06 DIAGNOSIS — Z79818 Long term (current) use of other agents affecting estrogen receptors and estrogen levels: Secondary | ICD-10-CM

## 2019-02-06 DIAGNOSIS — R7989 Other specified abnormal findings of blood chemistry: Secondary | ICD-10-CM | POA: Diagnosis not present

## 2019-02-06 DIAGNOSIS — S72042G Displaced fracture of base of neck of left femur, subsequent encounter for closed fracture with delayed healing: Secondary | ICD-10-CM | POA: Diagnosis not present

## 2019-02-06 DIAGNOSIS — D696 Thrombocytopenia, unspecified: Secondary | ICD-10-CM | POA: Diagnosis not present

## 2019-02-06 DIAGNOSIS — Z7401 Bed confinement status: Secondary | ICD-10-CM | POA: Diagnosis not present

## 2019-02-06 LAB — CBC WITH DIFFERENTIAL/PLATELET
Abs Immature Granulocytes: 0.04 10*3/uL (ref 0.00–0.07)
Basophils Absolute: 0 10*3/uL (ref 0.0–0.1)
Basophils Relative: 0 %
Eosinophils Absolute: 0 10*3/uL (ref 0.0–0.5)
Eosinophils Relative: 0 %
HCT: 39 % (ref 36.0–46.0)
Hemoglobin: 12.2 g/dL (ref 12.0–15.0)
Immature Granulocytes: 1 %
Lymphocytes Relative: 22 %
Lymphs Abs: 1.3 10*3/uL (ref 0.7–4.0)
MCH: 29.1 pg (ref 26.0–34.0)
MCHC: 31.3 g/dL (ref 30.0–36.0)
MCV: 93.1 fL (ref 80.0–100.0)
Monocytes Absolute: 0.4 10*3/uL (ref 0.1–1.0)
Monocytes Relative: 7 %
Neutro Abs: 4.2 10*3/uL (ref 1.7–7.7)
Neutrophils Relative %: 70 %
Platelets: 230 10*3/uL (ref 150–400)
RBC: 4.19 MIL/uL (ref 3.87–5.11)
RDW: 13.3 % (ref 11.5–15.5)
WBC: 6 10*3/uL (ref 4.0–10.5)
nRBC: 0 % (ref 0.0–0.2)

## 2019-02-06 LAB — LACTIC ACID, PLASMA
Lactic Acid, Venous: 2.1 mmol/L (ref 0.5–1.9)
Lactic Acid, Venous: 2 mmol/L (ref 0.5–1.9)
Lactic Acid, Venous: 1 mmol/L (ref 0.5–1.9)

## 2019-02-06 LAB — COMPREHENSIVE METABOLIC PANEL
ALT: 30 U/L (ref 0–44)
AST: 39 U/L (ref 15–41)
Albumin: 3.2 g/dL — ABNORMAL LOW (ref 3.5–5.0)
Alkaline Phosphatase: 130 U/L — ABNORMAL HIGH (ref 38–126)
Anion gap: 13 (ref 5–15)
BUN: 27 mg/dL — ABNORMAL HIGH (ref 8–23)
CO2: 19 mmol/L — ABNORMAL LOW (ref 22–32)
Calcium: 9.1 mg/dL (ref 8.9–10.3)
Chloride: 102 mmol/L (ref 98–111)
Creatinine, Ser: 1.27 mg/dL — ABNORMAL HIGH (ref 0.44–1.00)
GFR calc Af Amer: 46 mL/min — ABNORMAL LOW (ref >60)
GFR calc non Af Amer: 40 mL/min — ABNORMAL LOW (ref >60)
Glucose, Bld: 130 mg/dL — ABNORMAL HIGH (ref 70–99)
Potassium: 4.2 mmol/L (ref 3.5–5.1)
Sodium: 134 mmol/L — ABNORMAL LOW (ref 135–145)
Total Bilirubin: 0.1 mg/dL — ABNORMAL LOW (ref 0.3–1.2)
Total Protein: 6.9 g/dL (ref 6.5–8.1)

## 2019-02-06 LAB — D-DIMER, QUANTITATIVE: D-Dimer, Quant: 3.47 ug/mL-FEU — ABNORMAL HIGH (ref 0.00–0.50)

## 2019-02-06 LAB — LIPASE, BLOOD: Lipase: 166 U/L — ABNORMAL HIGH (ref 11–51)

## 2019-02-06 LAB — FERRITIN: Ferritin: 736 ng/mL — ABNORMAL HIGH (ref 11–307)

## 2019-02-06 LAB — C-REACTIVE PROTEIN: CRP: 0.8 mg/dL (ref <1.0)

## 2019-02-06 LAB — INFLUENZA PANEL BY PCR (TYPE A & B)
Influenza A By PCR: NEGATIVE
Influenza B By PCR: NEGATIVE

## 2019-02-06 LAB — LACTATE DEHYDROGENASE: LDH: 166 U/L (ref 98–192)

## 2019-02-06 LAB — CBG MONITORING, ED: Glucose-Capillary: 101 mg/dL — ABNORMAL HIGH (ref 70–99)

## 2019-02-06 LAB — PROCALCITONIN: Procalcitonin: <0.1 ng/mL

## 2019-02-06 LAB — SARS CORONAVIRUS 2 (TAT 6-24 HRS): SARS Coronavirus 2: POSITIVE — AB

## 2019-02-06 LAB — BRAIN NATRIURETIC PEPTIDE: B Natriuretic Peptide: 26.6 pg/mL (ref 0.0–100.0)

## 2019-02-06 MED ORDER — SODIUM CHLORIDE 0.9 % IV SOLN
500.0000 mg | Freq: Once | INTRAVENOUS | Status: AC
  Administered 2019-02-06: 500 mg via INTRAVENOUS
  Filled 2019-02-06: qty 500

## 2019-02-06 MED ORDER — ENOXAPARIN SODIUM 30 MG/0.3ML ~~LOC~~ SOLN
30.0000 mg | SUBCUTANEOUS | Status: DC
  Administered 2019-02-07 – 2019-02-11 (×5): 30 mg via SUBCUTANEOUS
  Filled 2019-02-06 (×5): qty 0.3

## 2019-02-06 MED ORDER — PANTOPRAZOLE SODIUM 40 MG PO TBEC
40.0000 mg | DELAYED_RELEASE_TABLET | Freq: Every day | ORAL | Status: DC
  Administered 2019-02-07 – 2019-02-11 (×5): 40 mg via ORAL
  Filled 2019-02-06 (×5): qty 1

## 2019-02-06 MED ORDER — SODIUM CHLORIDE 0.9 % IV SOLN
1.0000 g | Freq: Once | INTRAVENOUS | Status: AC
  Administered 2019-02-06: 1 g via INTRAVENOUS
  Filled 2019-02-06: qty 10

## 2019-02-06 MED ORDER — LACTATED RINGERS IV BOLUS
500.0000 mL | Freq: Once | INTRAVENOUS | Status: AC
  Administered 2019-02-06: 500 mL via INTRAVENOUS

## 2019-02-06 MED ORDER — LACTATED RINGERS IV BOLUS
1000.0000 mL | Freq: Once | INTRAVENOUS | Status: AC
  Administered 2019-02-06: 1000 mL via INTRAVENOUS

## 2019-02-06 MED ORDER — AMLODIPINE BESYLATE 5 MG PO TABS
5.0000 mg | ORAL_TABLET | Freq: Every day | ORAL | Status: DC
  Administered 2019-02-07 – 2019-02-11 (×5): 5 mg via ORAL
  Filled 2019-02-06 (×5): qty 1

## 2019-02-06 MED ORDER — SERTRALINE HCL 50 MG PO TABS
100.0000 mg | ORAL_TABLET | Freq: Every day | ORAL | Status: DC
  Administered 2019-02-07 – 2019-02-11 (×5): 100 mg via ORAL
  Filled 2019-02-06 (×5): qty 2

## 2019-02-06 MED ORDER — ENSURE ENLIVE PO LIQD
237.0000 mL | Freq: Two times a day (BID) | ORAL | Status: DC
  Administered 2019-02-07 – 2019-02-08 (×3): 237 mL via ORAL

## 2019-02-06 MED ORDER — BUPROPION HCL ER (XL) 150 MG PO TB24
150.0000 mg | ORAL_TABLET | Freq: Every day | ORAL | Status: DC
  Administered 2019-02-07 – 2019-02-11 (×5): 150 mg via ORAL
  Filled 2019-02-06 (×6): qty 1

## 2019-02-06 MED ORDER — TRAZODONE HCL 50 MG PO TABS
50.0000 mg | ORAL_TABLET | Freq: Every day | ORAL | Status: DC
  Administered 2019-02-06 – 2019-02-10 (×5): 50 mg via ORAL
  Filled 2019-02-06 (×5): qty 1

## 2019-02-06 MED ORDER — SODIUM CHLORIDE 0.9 % IV BOLUS: 1000.0000 mL | Freq: Once | INTRAVENOUS | Status: DC

## 2019-02-06 MED ORDER — DONEPEZIL HCL 10 MG PO TABS
5.0000 mg | ORAL_TABLET | Freq: Every day | ORAL | Status: DC
  Administered 2019-02-06 – 2019-02-10 (×5): 5 mg via ORAL
  Filled 2019-02-06 (×5): qty 1

## 2019-02-06 MED ORDER — SODIUM CHLORIDE 0.9 % IV SOLN
INTRAVENOUS | Status: DC
  Administered 2019-02-06: via INTRAVENOUS

## 2019-02-06 MED ORDER — IOHEXOL 350 MG/ML SOLN
100.0000 mL | Freq: Once | INTRAVENOUS | Status: AC | PRN
  Administered 2019-02-06: 56 mL via INTRAVENOUS

## 2019-02-06 MED ORDER — ASPIRIN 81 MG PO CHEW
81.0000 mg | CHEWABLE_TABLET | Freq: Every day | ORAL | Status: DC
  Administered 2019-02-06 – 2019-02-10 (×5): 81 mg via ORAL
  Filled 2019-02-06 (×5): qty 1

## 2019-02-06 MED ORDER — HYDROXYZINE PAMOATE 25 MG PO CAPS: 25.0000 mg | ORAL_CAPSULE | Freq: Three times a day (TID) | ORAL | Status: DC | PRN

## 2019-02-06 MED ORDER — MEMANTINE HCL 10 MG PO TABS
10.0000 mg | ORAL_TABLET | Freq: Every day | ORAL | Status: DC
  Administered 2019-02-07 – 2019-02-11 (×5): 10 mg via ORAL
  Filled 2019-02-06 (×6): qty 1

## 2019-02-06 NOTE — ED Notes (Signed)
[facility] aware of need for transport

## 2019-02-06 NOTE — ED Notes (Signed)
notified pt's husband Litisha Mohlman)  that pt would be transported to [facility]

## 2019-02-06 NOTE — ED Notes (Signed)
Called report to [place], Therapist, sports at [facility]

## 2019-02-06 NOTE — H&P (Signed)
History and Physical  Shelly Cervantes EHU:[id number] DOB: 01-16-1936 DOA: 02/06/2019  PCP: [name], MD   Chief Complaint: 8 lethargy confusion fall  HPI:  84 white female recently staying at [facility] secondary to hip fracture moderate to severe dementia, HTN, bipolar, deviated nasal septum and chronic pansinusitis, prior breast cancer status post surgery details unknown Admitted to hospital stay 10/8 through [id number] hip fracture History severely limited as patient cannot give information and her husband only knows ancillary reports From staff Apparently patient was supposed to visit Dr. Stann Mainland office for postop follow-up Did not make it there Report was given that the patient had been hypotensive and hypoxic and was sent over to the emergency room initially found to have blood pressure 70s over 50s O2 sat 82% in the setting of vomiting and a bowel movement She has not been sick lately there is no cases of coronavirus at her facility and she had been doing fair and had had an visit at her nursing facility in the outpatient setting recently unclear if there are any changes to medications  Chart review: . Reviewed  ED Course: 1.5 L bolus fluids given kept n.p.o. blood cultures obtained started azithromycin ceftriaxone CT head CT chest and EKG obtained  Note that her coronavirus test is pending at this time  Review of Systems:  Cannot assess as she has dementia  Past Medical History:  Diagnosis Date  . Alzheimer's disease ([place])   . Anxiety   . Breast cancer ([place])    left  . Depression   . GERD (gastroesophageal reflux disease)    occ  . High cholesterol   . Hypertension     Past Surgical History:  Procedure Laterality Date  . BREAST LUMPECTOMY WITH RADIOACTIVE SEED AND SENTINEL LYMPH NODE BIOPSY Left 11/25/2016   Procedure: LEFT BREAST LUMPECTOMY WITH RADIOACTIVE SEED AND AXILLARY SENTINEL LYMPH NODE BIOPSY;  Surgeon: Excell Seltzer, MD;  Location: [place];   Service: General;  Laterality: Left;  . EYE SURGERY Bilateral    cataracts  . FEMUR IM NAIL Left 01/19/2019   Procedure: INTRAMEDULLARY (IM) NAIL FEMORAL;  Surgeon: Nicholes Stairs, MD;  Location: [place];  Service: Orthopedics;  Laterality: Left;     reports that she has never smoked. She has never used smokeless tobacco. She reports current alcohol use of about 3.0 standard drinks of alcohol per week. She reports that she does not use drugs. Mobility: Moves with a walker  Allergies  Allergen Reactions  . Codeine Rash  . Penicillins Rash    Did it involve swelling of the face/tongue/throat, SOB, or low BP? No Did it involve sudden or severe rash/hives, skin peeling, or any reaction on the inside of your mouth or nose? No Did you need to seek medical attention at a hospital or doctor's office? No When did it last happen?Many years ago. If all above answers are "NO", may proceed with cephalosporin use. ( has tolerated Ancef )     Family History  Problem Relation Age of Onset  . Colon cancer Mother   . Cancer Father   . Lung cancer Father   . Dementia Sister      Prior to Admission medications   Medication Sig Start Date End Date Taking? Authorizing Provider  amLODipine (NORVASC) 5 MG tablet Take 1 tablet (5 mg total) by mouth daily. 01/27/19   Cristal Ford, DO  aspirin 81 MG tablet Take 81 mg by mouth at bedtime.     [provider]  buPROPion (WELLBUTRIN XL) 150 MG 24 hr tablet Take 150 mg by mouth daily. 12/01/18   [provider]  cholecalciferol (VITAMIN D3) 25 MCG (1000 UT) tablet Take 1 tablet (1,000 Units total) by mouth daily. 06/05/18   [name], [name], MD  docusate sodium (COLACE) 100 MG capsule Take 1 capsule (100 mg total) by mouth 2 (two) times daily. 01/27/19   Mikhail, Velta Addison, DO  donepezil (ARICEPT) 5 MG tablet Take 5 mg by mouth at bedtime. 12/12/18   [provider]  enoxaparin (LOVENOX) 30 MG/0.3ML injection Inject 0.3 mLs  (30 mg total) into the skin daily. 01/21/19   Constable, Amber, PA-C  feeding supplement, ENSURE ENLIVE, (ENSURE ENLIVE) LIQD Take 237 mLs by mouth 2 (two) times daily between meals. 01/27/19   Mikhail, Velta Addison, DO  fluticasone (FLONASE) 50 MCG/ACT nasal spray Place 1 spray into both nostrils 2 (two) times daily. 11/25/18   [provider]  hydrOXYzine (VISTARIL) 25 MG capsule Take 25 mg by mouth every 8 (eight) hours as needed for agitation. 10/10/18   [provider]  lisinopril (PRINIVIL,ZESTRIL) 20 MG tablet Take 20 mg by mouth 2 (two) times daily.  05/27/16   [provider]  megestrol (MEGACE) 20 MG tablet Take 20 mg by mouth daily.  11/22/18   [provider]  memantine (NAMENDA) 10 MG tablet Take 10 mg by mouth daily.     [provider]  Multiple Vitamin (MULTI-VITAMIN DAILY PO) Take 1 tablet by mouth daily.     [provider]  omeprazole (PRILOSEC) 20 MG capsule Take 20 mg by mouth daily as needed (heartburn).    [provider]  sertraline (ZOLOFT) 100 MG tablet Take 100 mg by mouth daily. 11/19/18   [provider]  timolol (TIMOPTIC) 0.5 % ophthalmic solution Place 1 drop into both eyes daily. 11/25/18   [provider]  traZODone (DESYREL) 50 MG tablet Take 50 mg by mouth at bedtime. 05/27/16   [provider]    Physical Exam: Vitals:   02/06/19 1656 02/06/19 1700  BP:  (!) 113/54  Pulse:  67  Resp:  13  Temp: 97.9 F (36.6 C)   SpO2:  96%      I have personally reviewed following labs and imaging studies  Labs:  Flu: Not performed but pending Lactic acid 2.0 trending to 1 BUN/creatinine up from baseline of 13/0.8 on 10/17-27/1.2 with mild metabolic acidosis CO2 of 19 Lipase 166 Anion gap 13 AST/ALT 39/30 Lactic acid trended to 1  Imaging studies:   CT head no acute intracranial abnormality mass-effect or an acute infarct stable findings CT chest no pulmonary embolus, no  dissection Groundglass opacities throughout posterior right lower lung possibly medially in the right lower and middle concern for atypical pneumonia focal hiatal hernia gallbladder absent  Medical tests:   EKG independently reviewed: PR interval 0.12 QRS axis about 50 degrees no ST-T wave changes although low voltage complexes  Active Problems:   PNA (pneumonia)   Assessment/Plan Pneumonia Possibly aspiration leg secondary to vomiting-monitor trends Start on Unasyn although has been given empiric antibiotics in ED Trend lactic acid A.m. chest x-ray IV fluids 75 cc an hour Allow soft diet May need speech therapy to see ?  Pancreatitis Lipase elevated, alk phos 130 lipase 166 transaminases are normal bilirubin is not elevated Vomiting could also cause an elevated lipase however if she has recurrent vomits again will need an ultrasound to rule out definitively gallbladder issues  AKI with lactic acidosis Lactic acidosis probably secondary to early onset sepsis versus kidney injury Hydrate with fluids Stop lisinopril 20 may be able to restart in the outpatient much lower dose See above discussion Left facial droop This is present on last admission she will continue her aspirin work-up last time was negative for stroke on 10/11 Bipolar Resume Wellbutrin Atarax Zoloft trazodone HTN Continue amlodipine only Moderate to severe Alzheimer's Patient cannot orient to place time person so will need to continue Aricept 5 Namenda 10 Hip fracture last admission We will need Lovenox ongoing for DVT prophylaxis might need rescheduling of appointment with orthopedics in outpatient setting with Dr. Stann Mainland   Severity of Illness: The appropriate patient status for this patient is INPATIENT. Inpatient status is judged to be reasonable and necessary in order to provide the required intensity of service to ensure the patient's safety. The patient's presenting symptoms, physical exam findings, and  initial radiographic and laboratory data in the context of their chronic comorbidities is felt to place them at high risk for further clinical deterioration. Furthermore, it is not anticipated that the patient will be medically stable for discharge from the hospital within 2 midnights of admission. The following factors support the patient status of inpatient.   " The patient's presenting symptoms include nausea vomiting and hypoxia. " The worrisome physical exam findings include debility. " The initial radiographic and laboratory data are worrisome because of AKI. " The chronic co-morbidities include dementia hip fracture.   * I certify that at the point of admission it is my clinical judgment that the patient will require inpatient hospital care spanning beyond 2 midnights from the point of admission due to high intensity of service, high risk for further deterioration and high frequency of surveillance required.*     DVT prophylaxis: Full Code Status: DNR confirmed Family Communication: Discussed with husband bedside Consults called: None  Time spent: 102 minutes  Verneita Griffes, MD [facility] 5:13 PM  02/06/2019, 5:13 PM

## 2019-02-06 NOTE — ED Notes (Signed)
Attempted report 

## 2019-02-06 NOTE — ED Notes (Signed)
RN attempted multiple times to contact [facility] to update on patient's status with no success-Monique,RN

## 2019-02-06 NOTE — ED Provider Notes (Signed)
[facility] EMERGENCY DEPARTMENT Provider Note   CSN: [id number] Arrival date & time: 02/06/19  1015     History   Chief Complaint Chief Complaint  Patient presents with   Loss of Consciousness   Hypotension    HPI Shelly Cervantes is a 83 y.o. female.      Loss of Consciousness Episode history:  Single Most recent episode:  Today Duration:  3 minutes Timing:  Constant Progression:  Resolved Chronicity:  New Context comment:  Recent L hip surgery in rehab facility. Had outpatient ortho appointment that they were preparing her for but found her to be hypotensive and hypoxic. Witnessed: yes   Relieved by:  Bed rest Worsened by:  Nothing Ineffective treatments:  None tried Associated symptoms: confusion, difficulty breathing, recent fall, shortness of breath and weakness   Associated symptoms: no fever     Past Medical History:  Diagnosis Date   Alzheimer's disease ([place])    Anxiety    Breast cancer ([place])    left   Depression    GERD (gastroesophageal reflux disease)    occ   High cholesterol    Hypertension     Patient Active Problem List   Diagnosis Date Noted   PNA (pneumonia) 02/06/2019   Cerebral thrombosis with cerebral infarction 01/22/2019   Closed fracture of left hip ([place]) 01/18/2019   Hypokalemia 01/18/2019   Hypertensive urgency 01/18/2019   Prolonged QT interval 01/18/2019   Fall    Late onset Alzheimer's disease without behavioral disturbance ([place]) 12/07/2018   Osteopenia 12/08/2016   Malignant neoplasm of upper-outer quadrant of left breast in female, estrogen receptor positive ([place]) 10/14/2016    Past Surgical History:  Procedure Laterality Date   BREAST LUMPECTOMY WITH RADIOACTIVE SEED AND SENTINEL LYMPH NODE BIOPSY Left 11/25/2016   Procedure: LEFT BREAST LUMPECTOMY WITH RADIOACTIVE SEED AND AXILLARY SENTINEL LYMPH NODE BIOPSY;  Surgeon: Excell Seltzer, MD;  Location: [facility];  Service: General;   Laterality: Left;   EYE SURGERY Bilateral    cataracts   FEMUR IM NAIL Left 01/19/2019   Procedure: INTRAMEDULLARY (IM) NAIL FEMORAL;  Surgeon: Nicholes Stairs, MD;  Location: [facility];  Service: Orthopedics;  Laterality: Left;     OB History   No obstetric history on file.      Home Medications    Prior to Admission medications   Medication Sig Start Date End Date Taking? Authorizing Provider  amLODipine (NORVASC) 5 MG tablet Take 1 tablet (5 mg total) by mouth daily. 01/27/19   Cristal Ford, DO  aspirin 81 MG tablet Take 81 mg by mouth at bedtime.     [provider]  buPROPion (WELLBUTRIN XL) 150 MG 24 hr tablet Take 150 mg by mouth daily. 12/01/18   [provider]  cholecalciferol (VITAMIN D3) 25 MCG (1000 UT) tablet Take 1 tablet (1,000 Units total) by mouth daily. 06/05/18   [name], [name], MD  docusate sodium (COLACE) 100 MG capsule Take 1 capsule (100 mg total) by mouth 2 (two) times daily. 01/27/19   Mikhail, Velta Addison, DO  donepezil (ARICEPT) 5 MG tablet Take 5 mg by mouth at bedtime. 12/12/18   [provider]  enoxaparin (LOVENOX) 30 MG/0.3ML injection Inject 0.3 mLs (30 mg total) into the skin daily. 01/21/19   Constable, Amber, PA-C  feeding supplement, ENSURE ENLIVE, (ENSURE ENLIVE) LIQD Take 237 mLs by mouth 2 (two) times daily between meals. 01/27/19   Mikhail, Velta Addison, DO  fluticasone (FLONASE) 50 MCG/ACT nasal  spray Place 1 spray into both nostrils 2 (two) times daily. 11/25/18   [provider]  hydrOXYzine (VISTARIL) 25 MG capsule Take 25 mg by mouth every 8 (eight) hours as needed for agitation. 10/10/18   [provider]  lisinopril (PRINIVIL,ZESTRIL) 20 MG tablet Take 20 mg by mouth 2 (two) times daily.  05/27/16   [provider]  megestrol (MEGACE) 20 MG tablet Take 20 mg by mouth daily.  11/22/18   [provider]  memantine (NAMENDA) 10 MG tablet Take 10 mg by mouth daily.     [provider]  Multiple Vitamin (MULTI-VITAMIN DAILY PO) Take 1 tablet by mouth daily.     [provider]  omeprazole (PRILOSEC) 20 MG capsule Take 20 mg by mouth daily as needed (heartburn).    [provider]  sertraline (ZOLOFT) 100 MG tablet Take 100 mg by mouth daily. 11/19/18   [provider]  timolol (TIMOPTIC) 0.5 % ophthalmic solution Place 1 drop into both eyes daily. 11/25/18   [provider]  traZODone (DESYREL) 50 MG tablet Take 50 mg by mouth at bedtime. 05/27/16   [provider]    Family History Family History  Problem Relation Age of Onset   Colon cancer Mother    Cancer Father    Lung cancer Father    Dementia Sister     Social History Social History   Tobacco Use   Smoking status: Never Smoker   Smokeless tobacco: Never Used  Substance Use Topics   Alcohol use: Yes    Alcohol/week: 3.0 standard drinks    Types: 3 Glasses of wine per week    Comment: 1-2 glasses of wine 2x/week   Drug use: No     Allergies   Codeine and Penicillins   Review of Systems Review of Systems  Unable to perform ROS: Dementia  Constitutional: Positive for appetite change (decreased significantly in the past 1 month) and fatigue. Negative for activity change and fever.  Respiratory: Positive for shortness of breath.   Cardiovascular: Positive for syncope.  Neurological: Positive for weakness.  Psychiatric/Behavioral: Positive for confusion.     Physical Exam Updated Vital Signs BP 119/65    Pulse 66    Temp 97.9 F (36.6 C) (Oral)    Resp 19    Ht 5\' 5"  (1.651 m)    Wt 45.4 kg    SpO2 96%    BMI 16.66 kg/m   Physical Exam Vitals signs and nursing note reviewed.  Constitutional:      General: She is in acute distress.     Appearance: She is well-developed. She is ill-appearing.  HENT:     Head: Normocephalic and atraumatic.  Eyes:     Conjunctiva/sclera: Conjunctivae normal.  Neck:     Musculoskeletal: Neck  supple. No neck rigidity.  Cardiovascular:     Rate and Rhythm: Normal rate and regular rhythm.     Heart sounds: No murmur.     Comments: Pulses diminished by palpable Pulmonary:     Effort: Pulmonary effort is normal. No respiratory distress.     Breath sounds: Rales (mild and diffuse) present.     Comments: SpO2 98% in RA Abdominal:     Palpations: Abdomen is soft.     Tenderness: There is no abdominal tenderness. There is no right CVA tenderness or left CVA tenderness.  Musculoskeletal:        General: No tenderness.     Comments: L hip with  surgical incision site C/D/I with staples intact. No findings concerning for surrounding infection or hematoma  Skin:    General: Skin is warm and dry.     Capillary Refill: Capillary refill takes 2 to 3 seconds.     Coloration: Skin is pale.  Neurological:     General: No focal deficit present.     Mental Status: She is alert. She is disoriented.     Comments: Patient has baseline dementia and her mental status is similar to her baseline.      ED Treatments / Results  Labs (all labs ordered are listed, but only abnormal results are displayed) Labs Reviewed  COMPREHENSIVE METABOLIC PANEL - Abnormal; Notable for the following components:      Result Value   Sodium 134 (*)    CO2 19 (*)    Glucose, Bld 130 (*)    BUN 27 (*)    Creatinine, Ser 1.27 (*)    Albumin 3.2 (*)    Alkaline Phosphatase 130 (*)    Total Bilirubin 0.1 (*)    GFR calc non Af Amer 40 (*)    GFR calc Af Amer 46 (*)    All other components within normal limits  LACTIC ACID, PLASMA - Abnormal; Notable for the following components:   Lactic Acid, Venous 2.1 (*)    All other components within normal limits  LACTIC ACID, PLASMA - Abnormal; Notable for the following components:   Lactic Acid, Venous 2.0 (*)    All other components within normal limits  LIPASE, BLOOD - Abnormal; Notable for the following components:   Lipase 166 (*)    All other components within  normal limits  CBG MONITORING, ED - Abnormal; Notable for the following components:   Glucose-Capillary 101 (*)    All other components within normal limits  SARS CORONAVIRUS 2 (TAT 6-24 HRS)  CULTURE, BLOOD (ROUTINE X 2)  CULTURE, BLOOD (ROUTINE X 2)  CBC WITH DIFFERENTIAL/PLATELET  LACTIC ACID, PLASMA    EKG None  Radiology Ct Head Wo Contrast  Result Date: 02/06/2019 CLINICAL DATA:  Syncopal episode EXAM: CT HEAD WITHOUT CONTRAST TECHNIQUE: Contiguous axial images were obtained from the base of the skull through the vertex without intravenous contrast. COMPARISON:  January 21, 2019 FINDINGS: Brain: There is stable disproportionate enlargement of the ventricles. Patchy and confluent areas of hypoattenuation in the supratentorial white matter are nonspecific but likely reflect stable chronic microvascular ischemic changes. There is no acute intracranial hemorrhage, mass-effect, or edema. Gray-white differentiation is preserved. There is no extra-axial fluid collection. Vascular: No hyperdense vessel or unexpected calcification.There is atherosclerotic calcification at the skull base. Skull: Calvarium is unremarkable. Sinuses/Orbits: No acute finding. Other: None. IMPRESSION: No acute intracranial hemorrhage, mass effect, or evidence of acute infarction. Stable chronic findings as detailed above. Electronically Signed   By: Macy Mis M.D.   On: 02/06/2019 11:47   Ct Angio Chest Pe W Or Wo Contrast  Result Date: 02/06/2019 CLINICAL DATA:  Shortness of breath with altered mental status. History of breast carcinoma EXAM: CT ANGIOGRAPHY CHEST WITH CONTRAST TECHNIQUE: Multidetector CT imaging of the chest was performed using the standard protocol during bolus administration of intravenous contrast. Multiplanar CT image reconstructions and MIPs were obtained to evaluate the vascular anatomy. CONTRAST:  46 mL OMNIPAQUE IOHEXOL 350 MG/ML SOLN COMPARISON:  Chest radiograph February 06, 2016  FINDINGS: Cardiovascular: There is no demonstrable pulmonary embolus. There is no thoracic aortic aneurysm. There is no appreciable thoracic aortic dissection. The contrast  bolus in the aorta is somewhat less than optimal for dissection assessment. There is aortic atherosclerosis. There are foci of calcification in proximal visualized great vessels. There is no pericardial effusion or pericardial thickening. There are foci of coronary artery calcification. Mediastinum/Nodes: Thyroid appears unremarkable. There is no appreciable thoracic adenopathy. There is a focal hiatal hernia. Lungs/Pleura: There is a degree of underlying centrilobular emphysematous change. No evident pneumothorax. There is ground-glass type opacity in much of the right lower lobe with more subtle areas of ground-glass opacity in the posterior segment of the left lower lobe and in the posterior aspect of the lateral segment right middle lobe. There is no consolidation. No pleural effusion. Upper Abdomen: Gallbladder is absent. There is upper abdominal aortic atherosclerosis. Visualized upper abdominal structures otherwise appear unremarkable. Musculoskeletal: There is anterior wedging of the T12 and T10 vertebral bodies. There are no blastic or lytic bone lesions. No chest wall lesions are evident. Review of the MIP images confirms the above findings. IMPRESSION: 1. No demonstrable pulmonary embolus. No thoracic aortic aneurysm. No dissection evident with the proviso that contrast bolus in the aorta is less than optimal for dissection assessment. There is aortic atherosclerosis as well as foci of great vessel and coronary artery calcification. 2. Ground-glass type opacity throughout much of the posterior segment of the right lower lobe and to a lesser extent more medially in the right lower lobe. Small foci of ground-glass opacity in the lateral segment right middle lobe and in the posterior segment left lower lobe. Concern for atypical organism  pneumonia. No frank consolidation. No pleural effusion. Mild underlying centrilobular emphysematous change. 3.  No evident thoracic adenopathy. 4.  Focal hiatal hernia present. 5.  Gallbladder absent. Aortic Atherosclerosis (ICD10-I70.0) and Emphysema (ICD10-J43.9). Electronically Signed   By: Lowella Grip III M.D.   On: 02/06/2019 15:21   Dg Chest Portable 1 View  Result Date: 02/06/2019 CLINICAL DATA:  Loss of consciousness, history of breast cancer, Alzheimer's, hypertension EXAM: PORTABLE CHEST 1 VIEW COMPARISON:  Portable exam 1104 hours compared to old 01/18/2019 FINDINGS: Normal heart size, mediastinal contours, and pulmonary vascularity. Atherosclerotic calcification aorta. Bronchitic changes and hyperinflation question COPD. No infiltrate, pleural effusion or pneumothorax. Bones demineralized with dextroconvex thoracolumbar scoliosis. IMPRESSION: Probable COPD changes without acute infiltrate. Aortic atherosclerosis. Electronically Signed   By: Lavonia Dana M.D.   On: 02/06/2019 11:15    Procedures Procedures (including critical care time)  Medications Ordered in ED Medications  cefTRIAXone (ROCEPHIN) 1 g in sodium chloride 0.9 % 100 mL IVPB (has no administration in time range)  azithromycin (ZITHROMAX) 500 mg in sodium chloride 0.9 % 250 mL IVPB (has no administration in time range)  lactated ringers bolus 1,000 mL (0 mLs Intravenous Stopped 02/06/19 1151)  lactated ringers bolus 500 mL (0 mLs Intravenous Stopped 02/06/19 1320)  iohexol (OMNIPAQUE) 350 MG/ML injection 100 mL (56 mLs Intravenous Contrast Given 02/06/19 1500)     Initial Impression / Assessment and Plan / ED Course  I have reviewed the triage vital signs and the nursing notes.  Pertinent labs & imaging results that were available during my care of the patient were reviewed by me and considered in my medical decision making (see chart for details).        Patient is an 83 year old female with history and  physical exam as above presents emergency department for evaluation of syncopal episode as well as shortness of breath, and hypotension.  She was hypoxic prior to arrival although she is satting  98% in room air in the emergency department.  Arrived with blood pressure 74/54 however this hypotension resolved with IV fluid bolus.  Labs were obtained in the emergency department with a CBC, metabolic panel, EKG was also obtained as was chest x-ray.  Chest x-ray demonstrated no emergent findings.  EKG likewise demonstrated no emergent findings.  Labs demonstrated an elevated creatinine consistent with dehydration which fits with history and physical exam.  Due to patient's episode of hypotension and hypoxemia with her recent hip surgery she was deemed high risk for PE therefore CTA PE was obtained.  I do feel that this is necessary despite patient's current AKI.  Her GFR is greater than 30 and she is at high risk of having a PE.  CTA PE demonstrated no findings of pulmonary embolism but did demonstrate groundglass opacity consistent with possible multifocal pneumonia.  Patient was initiated on Rocephin and azithromycin for pneumonia and plans to be admitted to inpatient service for further work-up and management.  Patient was seen in conjunction with my attending physician, Dr. Sedonia Small  Final Clinical Impressions(s) / ED Diagnoses   Final diagnoses:  Syncope and collapse  Hypotension due to hypovolemia    ED Discharge Orders    None       Romona Curls, MD 02/06/19 1708    Maudie Flakes, MD 02/07/19 1223

## 2019-02-06 NOTE — Progress Notes (Addendum)
I was informed subsequent to my admitting the patient that the patient was coronavirus positive  Patient will go to coronavirus cohort unit I used PPE For double gloved and wore a body cover prior to going into the room. Will obtain coronavirus specific labs She is not needing oxygen at this time and looks well-if this changes may need to initiate remdesivir and or Decadron.  I have asked that nursing will have been exposed to the patient report health at work for their own safety  Verneita Griffes, MD [facility] 5:34 PM

## 2019-02-06 NOTE — Care Management (Signed)
EDCM provided cup of water to patient after verifying NPO order d/c'd with EDP.

## 2019-02-06 NOTE — ED Triage Notes (Signed)
Pt arrives to ED from [facility] with complaints of a syncopal episode this morning while staff was giving her morning medications. EMS reports after patients syncopal episode she had a bowel movement, vomited, and got short of breath. On EMS arrival patient blood pressure was 74/54 and 82% oxygen saturation on room air with weak radial pulses.

## 2019-02-06 NOTE — ED Provider Notes (Signed)
4:56 PM signout from ED resident at shift change.  Patient here with a hypotensive episode this morning at her rehab facility.  She has therefore previous fracture.  She had hypotension and hypoxic spell which improved with IV fluids.  Blood pressure 78/53 on arrival.  She has been worked up and found to have a mild acute kidney injury.  She had a PE study done which did not show any embolism however did show groundglass areas consistent with pneumonia.  I spoke with Dr. Verlon Au regarding admission.  I spoke with nursing and they will obtain another temperature.  Plan to admit patient for treatment of pneumonia.  Rocephin and azithromycin started by previous team.  BP 119/65   Pulse 66   Temp 97.9 F (36.6 C) (Oral)   Resp 19   Ht 5\' 5"  (1.651 m)   Wt 45.4 kg   SpO2 96%   BMI 16.66 kg/m       Carlisle Cater, PA-C 02/06/19 1657    Charlesetta Shanks, MD 02/07/19 [phone]

## 2019-02-07 ENCOUNTER — Inpatient Hospital Stay (HOSPITAL_COMMUNITY): Payer: Medicare HMO

## 2019-02-07 DIAGNOSIS — L8990 Pressure ulcer of unspecified site, unspecified stage: Secondary | ICD-10-CM | POA: Insufficient documentation

## 2019-02-07 DIAGNOSIS — R7989 Other specified abnormal findings of blood chemistry: Secondary | ICD-10-CM

## 2019-02-07 DIAGNOSIS — U071 COVID-19: Principal | ICD-10-CM

## 2019-02-07 LAB — CBC WITH DIFFERENTIAL/PLATELET
Abs Immature Granulocytes: 0.02 10*3/uL (ref 0.00–0.07)
Basophils Absolute: 0 10*3/uL (ref 0.0–0.1)
Basophils Relative: 0 %
Eosinophils Absolute: 0 10*3/uL (ref 0.0–0.5)
Eosinophils Relative: 0 %
HCT: 29.6 % — ABNORMAL LOW (ref 36.0–46.0)
Hemoglobin: 9.3 g/dL — ABNORMAL LOW (ref 12.0–15.0)
Immature Granulocytes: 1 %
Lymphocytes Relative: 33 %
Lymphs Abs: 1.2 10*3/uL (ref 0.7–4.0)
MCH: 29.3 pg (ref 26.0–34.0)
MCHC: 31.4 g/dL (ref 30.0–36.0)
MCV: 93.4 fL (ref 80.0–100.0)
Monocytes Absolute: 0.3 10*3/uL (ref 0.1–1.0)
Monocytes Relative: 8 %
Neutro Abs: 2.1 10*3/uL (ref 1.7–7.7)
Neutrophils Relative %: 58 %
Platelets: 147 10*3/uL — ABNORMAL LOW (ref 150–400)
RBC: 3.17 MIL/uL — ABNORMAL LOW (ref 3.87–5.11)
RDW: 13.6 % (ref 11.5–15.5)
WBC: 3.6 10*3/uL — ABNORMAL LOW (ref 4.0–10.5)
nRBC: 0 % (ref 0.0–0.2)

## 2019-02-07 LAB — COMPREHENSIVE METABOLIC PANEL
ALT: 23 U/L (ref 0–44)
AST: 30 U/L (ref 15–41)
Albumin: 2.7 g/dL — ABNORMAL LOW (ref 3.5–5.0)
Alkaline Phosphatase: 97 U/L (ref 38–126)
Anion gap: 9 (ref 5–15)
BUN: 23 mg/dL (ref 8–23)
CO2: 18 mmol/L — ABNORMAL LOW (ref 22–32)
Calcium: 8.2 mg/dL — ABNORMAL LOW (ref 8.9–10.3)
Chloride: 111 mmol/L (ref 98–111)
Creatinine, Ser: 0.91 mg/dL (ref 0.44–1.00)
GFR calc Af Amer: >60 mL/min (ref >60)
GFR calc non Af Amer: 59 mL/min — ABNORMAL LOW (ref >60)
Glucose, Bld: 72 mg/dL (ref 70–99)
Potassium: 3.3 mmol/L — ABNORMAL LOW (ref 3.5–5.1)
Sodium: 138 mmol/L (ref 135–145)
Total Bilirubin: 0.2 mg/dL — ABNORMAL LOW (ref 0.3–1.2)
Total Protein: 5.7 g/dL — ABNORMAL LOW (ref 6.5–8.1)

## 2019-02-07 LAB — D-DIMER, QUANTITATIVE: D-Dimer, Quant: 3.56 ug/mL-FEU — ABNORMAL HIGH (ref 0.00–0.50)

## 2019-02-07 LAB — C-REACTIVE PROTEIN: CRP: 0.9 mg/dL (ref <1.0)

## 2019-02-07 LAB — LIPASE, BLOOD: Lipase: 94 U/L — ABNORMAL HIGH (ref 11–51)

## 2019-02-07 MED ORDER — POTASSIUM CHLORIDE IN NACL 20-0.45 MEQ/L-% IV SOLN
INTRAVENOUS | Status: DC
  Administered 2019-02-07: 19:00:00 via INTRAVENOUS
  Filled 2019-02-07 (×2): qty 1000

## 2019-02-07 NOTE — Progress Notes (Signed)
Pt arrived w/[facility] to self with confusion. No distress. Able to communicate well. No c/o pain. Very cooperative. Pt admitted to Cardiac Monitor. Will continue to monitor.

## 2019-02-07 NOTE — Progress Notes (Signed)
Lower ext venous duplex  has been completed. Refer to [facility] under chart review to view preliminary results.   02/07/2019  1:47 PM Shelly Cervantes, Shelly Cervantes

## 2019-02-07 NOTE — Progress Notes (Signed)
Family Update  Spoke with husband to review POC and d/c planning.

## 2019-02-07 NOTE — Progress Notes (Signed)
PROGRESS NOTE  Shelly Cervantes  [id number] DOB: [birth year] DOA: 02/06/2019 PCP: [name], MD   Brief Narrative: Shelly Cervantes is an 83 y.o. female with a history of dementia, bipolar disorder, HTN, and recent admission for hip fracture who was sent from SNF due to vomiting, and reported hypoxia and hypotension. She was admitted 10/8 - 10/17 and discharged to SNF for rehabilitation following hip fracture. On evaluation in the ED she was not hypoxic with stable vital signs. CT head was performed showing no acute changes. CTA chest ruled out pulmonary embolism, but did show GGOs in posterior right lower lobe. In that clinical situation, aspiration was suspected though CRP was not elevated, procalcitonin was undetectable, CBC was normal and the patient had no respiratory symptoms. Lipase was modestly elevated though abdominal exam was benign. Gallbladder noted to be absent on CT chest and pancreas with no definite abnormalities. AST, ALT, and bilirubin were within normal limits. Ceftriaxone, azithromycin and IV fluids provided. Ultimately SARS-CoV-2 screen was positive, so patient admitted to [facility] though no further symptoms have developed.   Assessment & Plan: Active Problems:   PNA (pneumonia)   COVID-19  Pneumonia: Unclear etiology of relatively localized GGOs on CT. High risk for aspiration though blood work highly suggestive of no bacterial infection or significant inflammation from covid-19.  - Monitor off targeted Tx for now, received CTX, azithromycin in ED - SLP evaluation  Covid-19 infection: Possible cause of GI symptoms. - Reserve targeted Tx for now since no hypoxia or respiratory symptoms.  - Airborne/contact precautions - Cultures drawn on admission.   Elevated d-dimer: Commonly seen during covid, stable.  - Check LE U/S - CTA chest without PE.   AKI with lactic acidosis: Improving Cr 1.27 > 0.91.  - Hold ACEi - Given IV fluids in ED. Was given the attempt to eat on  her own but not taking much, will restart IVF.  Lipase elevation: With benign, nontender abdomen and ability to tolerate po intake, pancreatitis felt to be unlikely and lipase elevation which is improving more likely due to vomiting. Alkaline phosphatase normalized - Monitor  Pancytopenia: Suspect hemoconcentrated on admission. No bleeding reported. Possibly due to viral infection.  - Continue monitoring  Hypokalemia:  - Supplement in AVF and monitor  Alzheimer's dementia, bipolar disorder:  - Continue home medications - Delirium precautions - Monitor  Hip fracture:  - Outpatient follow up - PT/OT to continue while admitted  HTN:  - Continue norvasc, hold lisinopril as above  Left facial droop: Not new, no acute findings on CT head  Pressure Injuries: Pressure Injury 02/07/19 Buttocks Left;Upper Stage II -  Partial thickness loss of dermis presenting as a shallow open ulcer with a red, pink wound bed without slough. pink, skin broken but scabing  (Active)  02/07/19 1635  Location: Buttocks  Location Orientation: Left;Upper  Staging: Stage II -  Partial thickness loss of dermis presenting as a shallow open ulcer with a red, pink wound bed without slough.  Wound Description (Comments): pink, skin broken but scabing   Present on Admission:      Pressure Injury 02/07/19 Buttocks Left;Lower Stage II -  Partial thickness loss of dermis presenting as a shallow open ulcer with a red, pink wound bed without slough. (Active)  02/07/19 1600  Location: Buttocks  Location Orientation: Left;Lower  Staging: Stage II -  Partial thickness loss of dermis presenting as a shallow open ulcer with a red, pink wound bed without slough.  Wound Description (  Comments):   Present on Admission:      Pressure Injury 02/07/19 Buttocks Left Stage II -  Partial thickness loss of dermis presenting as a shallow open ulcer with a red, pink wound bed without slough. (Active)  02/07/19 1600  Location:  Buttocks  Location Orientation: Left  Staging: Stage II -  Partial thickness loss of dermis presenting as a shallow open ulcer with a red, pink wound bed without slough.  Wound Description (Comments):   Present on Admission:     DVT prophylaxis: Lovenox Code Status: DNR Family Communication: None at bedside Disposition Plan: SNF once improving  Consultants:   None  Procedures:   None  Antimicrobials:  Ceftriaxone, azithromycin   Subjective: Confused but without complaints. Denies pain anywhere, says she's hungry and eating well. No cough. No shortness of breath.   Objective: Vitals:   02/06/19 2244 02/07/19 0415 02/07/19 0742 02/07/19 1549  BP: 130/61 100/74 123/66 (!) 136/58  Pulse: 70 76 73 83  Resp: 16 19 (!) 21 18  Temp: 98.3 F (36.8 C) 97.8 F (36.6 C) (!) 97.5 F (36.4 C) 98.9 F (37.2 C)  TempSrc: Oral Oral Oral Oral  SpO2: 99% 100% 100% 98%  Weight:      Height:        Intake/Output Summary (Last 24 hours) at 02/07/2019 1709 Last data filed at 02/07/2019 1200 Gross per 24 hour  Intake 1628.06 ml  Output --  Net 1628.06 ml   Filed Weights   02/06/19 1152  Weight: 45.4 kg   Gen: Elderly female in no distress Pulm: Non-labored breathing room air. Clear to auscultation bilaterally.  CV: Regular rate and rhythm. No murmur, rub, or gallop. No JVD, no pedal edema. GI: Abdomen soft, non-tender, non-distended, with normoactive bowel sounds. No organomegaly or masses felt. Ext: Warm, no deformities Skin: No rashes, lesions or ulcers on visualized skin. Sacrum not examined. Neuro: Alert, conversant but not oriented. Left facial droop but no new focal neurological deficits. Psych: Judgement and insight appear impaired by poor recall. Mood & affect appropriate.   Data Reviewed: I have personally reviewed following labs and imaging studies  CBC: Recent Labs  Lab 02/06/19 1036 02/07/19 0330 02/07/19 0500  WBC 6.0 THIS TEST WAS ORDERED IN ERROR AND HAS  BEEN CREDITED. 3.6*  NEUTROABS 4.2 THIS TEST WAS ORDERED IN ERROR AND HAS BEEN CREDITED. 2.1  HGB 12.2 THIS TEST WAS ORDERED IN ERROR AND HAS BEEN CREDITED. 9.3*  HCT 39.0 THIS TEST WAS ORDERED IN ERROR AND HAS BEEN CREDITED. 29.6*  MCV 93.1 THIS TEST WAS ORDERED IN ERROR AND HAS BEEN CREDITED. 93.4  PLT 230 THIS TEST WAS ORDERED IN ERROR AND HAS BEEN CREDITED. [id number]*   Basic Metabolic Panel: Recent Labs  Lab 02/06/19 1036 02/07/19 0330  NA 134* 138  K 4.2 3.3*  CL 102 111  CO2 19* 18*  GLUCOSE 130* 72  BUN 27* 23  CREATININE 1.27* 0.91  CALCIUM 9.1 8.2*   GFR: Estimated Creatinine Clearance: 34.7 mL/min (by C-G formula based on SCr of 0.91 mg/dL). Liver Function Tests: Recent Labs  Lab 02/06/19 1036 02/07/19 0330  AST 39 30  ALT 30 23  ALKPHOS 130* 97  BILITOT 0.1* 0.2*  PROT 6.9 5.7*  ALBUMIN 3.2* 2.7*   Recent Labs  Lab 02/06/19 1036 02/07/19 0330  LIPASE 166* 94*   No results for input(s): AMMONIA in the last 168 hours. Coagulation Profile: No results for input(s): INR, PROTIME in the last 168 hours.  Cardiac Enzymes: No results for input(s): CKTOTAL, CKMB, CKMBINDEX, TROPONINI in the last 168 hours. BNP (last 3 results) No results for input(s): PROBNP in the last 8760 hours. HbA1C: No results for input(s): HGBA1C in the last 72 hours. CBG: Recent Labs  Lab 02/06/19 1150  GLUCAP 101*   Lipid Profile: No results for input(s): CHOL, HDL, LDLCALC, TRIG, CHOLHDL, LDLDIRECT in the last 72 hours. Thyroid Function Tests: No results for input(s): TSH, T4TOTAL, FREET4, T3FREE, THYROIDAB in the last 72 hours. Anemia Panel: Recent Labs    02/06/19 1758  FERRITIN 736*   Urine analysis: No results found for: COLORURINE, APPEARANCEUR, LABSPEC, PHURINE, GLUCOSEU, HGBUR, BILIRUBINUR, KETONESUR, PROTEINUR, UROBILINOGEN, NITRITE, LEUKOCYTESUR Recent Results (from the past 240 hour(s))  SARS CORONAVIRUS 2 (TAT 6-24 HRS) Nasopharyngeal Nasopharyngeal Swab     Status:  Abnormal   Collection Time: 02/06/19 12:46 PM   Specimen: Nasopharyngeal Swab  Result Value Ref Range Status   SARS Coronavirus 2 POSITIVE (A) NEGATIVE Final    Comment: RESULT CALLED TO, READ BACK BY AND VERIFIED WITH: H VANKRETSCHMAR,RN 1723 02/06/2019 D BRADLEY (NOTE) SARS-CoV-2 target nucleic acids are DETECTED. The SARS-CoV-2 RNA is generally detectable in upper and lower respiratory specimens during the acute phase of infection. Positive results are indicative of active infection with SARS-CoV-2. Clinical  correlation with patient history and other diagnostic information is necessary to determine patient infection status. Positive results do  not rule out bacterial infection or co-infection with other viruses. The expected result is Negative. Fact Sheet for Patients: SugarRoll.be Fact Sheet for Healthcare Providers: https://www.woods-mathews.com/ This test is not yet approved or cleared by the [place] FDA and  has been authorized for detection and/or diagnosis of SARS-CoV-2 by FDA under an Emergency Use Authorization (EUA). This EUA will remain  in effect (meaning this test can be [place] ed) for the duration of the COVID-19 declaration under Section 564(b)(1) of the Act, 21 U.S.C. section 360bbb-3(b)(1), unless the authorization is terminated or revoked sooner. Performed at [facility], [place] [address], [place], [place] [address]   Blood culture (routine x 2)     Status: None (Preliminary result)   Collection Time: 02/06/19  5:58 PM   Specimen: BLOOD  Result Value Ref Range Status   Specimen Description BLOOD LEFT ANTECUBITAL  Final   Special Requests   Final    BOTTLES DRAWN AEROBIC AND ANAEROBIC Blood Culture adequate volume   Culture   Final    NO GROWTH < 24 HOURS Performed at [facility], [place] [address], [place], [place] [address]    Report Status PENDING  Incomplete  Blood culture (routine x 2)     Status:  None (Preliminary result)   Collection Time: 02/06/19  6:07 PM   Specimen: BLOOD LEFT FOREARM  Result Value Ref Range Status   Specimen Description BLOOD LEFT FOREARM  Final   Special Requests   Final    BOTTLES DRAWN AEROBIC ONLY Blood Culture results may not be optimal due to an inadequate volume of blood received in culture bottles   Culture   Final    NO GROWTH < 24 HOURS Performed at [facility], [place] [address], [place],  [address]    Report Status PENDING  Incomplete      Radiology Studies: Ct Head Wo Contrast  Result Date: 02/06/2019 CLINICAL DATA:  Syncopal episode EXAM: CT HEAD WITHOUT CONTRAST TECHNIQUE: Contiguous axial images were obtained from the base of the skull through the vertex  without intravenous contrast. COMPARISON:  January 21, 2019 FINDINGS: Brain: There is stable disproportionate enlargement of the ventricles. Patchy and confluent areas of hypoattenuation in the supratentorial white matter are nonspecific but likely reflect stable chronic microvascular ischemic changes. There is no acute intracranial hemorrhage, mass-effect, or edema. Gray-white differentiation is preserved. There is no extra-axial fluid collection. Vascular: No hyperdense vessel or unexpected calcification.There is atherosclerotic calcification at the skull base. Skull: Calvarium is unremarkable. Sinuses/Orbits: No acute finding. Other: None. IMPRESSION: No acute intracranial hemorrhage, mass effect, or evidence of acute infarction. Stable chronic findings as detailed above. Electronically Signed   By: Macy Mis M.D.   On: 02/06/2019 11:47   Ct Angio Chest Pe W Or Wo Contrast  Result Date: 02/06/2019 CLINICAL DATA:  Shortness of breath with altered mental status. History of breast carcinoma EXAM: CT ANGIOGRAPHY CHEST WITH CONTRAST TECHNIQUE: Multidetector CT imaging of the chest was performed using the standard protocol during bolus administration of intravenous contrast.  Multiplanar CT image reconstructions and MIPs were obtained to evaluate the vascular anatomy. CONTRAST:  46 mL OMNIPAQUE IOHEXOL 350 MG/ML SOLN COMPARISON:  Chest radiograph February 06, 2016 FINDINGS: Cardiovascular: There is no demonstrable pulmonary embolus. There is no thoracic aortic aneurysm. There is no appreciable thoracic aortic dissection. The contrast bolus in the aorta is somewhat less than optimal for dissection assessment. There is aortic atherosclerosis. There are foci of calcification in proximal visualized great vessels. There is no pericardial effusion or pericardial thickening. There are foci of coronary artery calcification. Mediastinum/Nodes: Thyroid appears unremarkable. There is no appreciable thoracic adenopathy. There is a focal hiatal hernia. Lungs/Pleura: There is a degree of underlying centrilobular emphysematous change. No evident pneumothorax. There is ground-glass type opacity in much of the right lower lobe with more subtle areas of ground-glass opacity in the posterior segment of the left lower lobe and in the posterior aspect of the lateral segment right middle lobe. There is no consolidation. No pleural effusion. Upper Abdomen: Gallbladder is absent. There is upper abdominal aortic atherosclerosis. Visualized upper abdominal structures otherwise appear unremarkable. Musculoskeletal: There is anterior wedging of the T12 and T10 vertebral bodies. There are no blastic or lytic bone lesions. No chest wall lesions are evident. Review of the MIP images confirms the above findings. IMPRESSION: 1. No demonstrable pulmonary embolus. No thoracic aortic aneurysm. No dissection evident with the proviso that contrast bolus in the aorta is less than optimal for dissection assessment. There is aortic atherosclerosis as well as foci of great vessel and coronary artery calcification. 2. Ground-glass type opacity throughout much of the posterior segment of the right lower lobe and to a lesser extent  more medially in the right lower lobe. Small foci of ground-glass opacity in the lateral segment right middle lobe and in the posterior segment left lower lobe. Concern for atypical organism pneumonia. No frank consolidation. No pleural effusion. Mild underlying centrilobular emphysematous change. 3.  No evident thoracic adenopathy. 4.  Focal hiatal hernia present. 5.  Gallbladder absent. Aortic Atherosclerosis (ICD10-I70.0) and Emphysema (ICD10-J43.9). Electronically Signed   By: Lowella Grip III M.D.   On: 02/06/2019 15:21   Dg Chest Portable 1 View  Result Date: 02/06/2019 CLINICAL DATA:  Loss of consciousness, history of breast cancer, Alzheimer's, hypertension EXAM: PORTABLE CHEST 1 VIEW COMPARISON:  Portable exam 1104 hours compared to old 01/18/2019 FINDINGS: Normal heart size, mediastinal contours, and pulmonary vascularity. Atherosclerotic calcification aorta. Bronchitic changes and hyperinflation question COPD. No infiltrate, pleural effusion or pneumothorax. Bones demineralized with  dextroconvex thoracolumbar scoliosis. IMPRESSION: Probable COPD changes without acute infiltrate. Aortic atherosclerosis. Electronically Signed   By: Lavonia Dana M.D.   On: 02/06/2019 11:15   Vas [place] Lower Extremity Venous (dvt)  Result Date: 02/07/2019  Lower Venous Study Indications: Edema, and Covid Positive with elevated d-dimer.  Comparison Study: no priors. Performing Technologist: Oda Cogan RDMS, RVT  Examination Guidelines: A complete evaluation includes B-mode imaging, spectral Doppler, color Doppler, and power Doppler as needed of all accessible portions of each vessel. Bilateral testing is considered an integral part of a complete examination. Limited examinations for reoccurring indications may be performed as noted.  +---------+---------------+---------+-----------+----------+--------------+  RIGHT     Compressibility Phasicity Spontaneity Properties Thrombus Aging   +---------+---------------+---------+-----------+----------+--------------+  CFV       Full            Yes       Yes                                    +---------+---------------+---------+-----------+----------+--------------+  SFJ       Full                                                             +---------+---------------+---------+-----------+----------+--------------+  FV Prox   Full                                                             +---------+---------------+---------+-----------+----------+--------------+  FV Mid    Full                                                             +---------+---------------+---------+-----------+----------+--------------+  FV Distal Full                                                             +---------+---------------+---------+-----------+----------+--------------+  PFV       Full                                                             +---------+---------------+---------+-----------+----------+--------------+  POP       Full            Yes       Yes                                    +---------+---------------+---------+-----------+----------+--------------+  PTV  Full                                                             +---------+---------------+---------+-----------+----------+--------------+  PERO      Full                                                             +---------+---------------+---------+-----------+----------+--------------+   +---------+---------------+---------+-----------+----------+--------------+  LEFT      Compressibility Phasicity Spontaneity Properties Thrombus Aging  +---------+---------------+---------+-----------+----------+--------------+  CFV       Full            Yes       Yes                                    +---------+---------------+---------+-----------+----------+--------------+  SFJ       Full                                                              +---------+---------------+---------+-----------+----------+--------------+  FV Prox   Full                                                             +---------+---------------+---------+-----------+----------+--------------+  FV Mid    Full                                                             +---------+---------------+---------+-----------+----------+--------------+  FV Distal Full                                                             +---------+---------------+---------+-----------+----------+--------------+  PFV       Full                                                             +---------+---------------+---------+-----------+----------+--------------+  POP       Full            Yes       Yes                                    +---------+---------------+---------+-----------+----------+--------------+  PTV       Full                                                             +---------+---------------+---------+-----------+----------+--------------+  PERO      Full                                                             +---------+---------------+---------+-----------+----------+--------------+     Summary: Right: There is no evidence of deep vein thrombosis in the lower extremity. No cystic structure found in the popliteal fossa. Left: There is no evidence of deep vein thrombosis in the lower extremity. No cystic structure found in the popliteal fossa.  *See table(s) above for measurements and observations.    Preliminary     Scheduled Meds:  amLODipine  5 mg Oral Daily   aspirin  81 mg Oral QHS   buPROPion  150 mg Oral Daily   donepezil  5 mg Oral QHS   enoxaparin  30 mg Subcutaneous Q24H   feeding supplement (ENSURE ENLIVE)  237 mL Oral BID BM   memantine  10 mg Oral Daily   pantoprazole  40 mg Oral Daily   sertraline  100 mg Oral Daily   traZODone  50 mg Oral QHS   Continuous Infusions:  sodium chloride 75 mL/hr at 02/07/19 1200     LOS: 1 day   Time  spent: 35 minutes.  Patrecia Pour, MD [facility] [email or web address] 02/07/2019, 5:09 PM

## 2019-02-08 LAB — CBC WITH DIFFERENTIAL/PLATELET
Abs Immature Granulocytes: 0.02 10*3/uL (ref 0.00–0.07)
Basophils Absolute: 0 10*3/uL (ref 0.0–0.1)
Basophils Relative: 0 %
Eosinophils Absolute: 0 10*3/uL (ref 0.0–0.5)
Eosinophils Relative: 0 %
HCT: 30.9 % — ABNORMAL LOW (ref 36.0–46.0)
Hemoglobin: 9.8 g/dL — ABNORMAL LOW (ref 12.0–15.0)
Immature Granulocytes: 1 %
Lymphocytes Relative: 22 %
Lymphs Abs: 0.8 10*3/uL (ref 0.7–4.0)
MCH: 29.3 pg (ref 26.0–34.0)
MCHC: 31.7 g/dL (ref 30.0–36.0)
MCV: 92.2 fL (ref 80.0–100.0)
Monocytes Absolute: 0.2 10*3/uL (ref 0.1–1.0)
Monocytes Relative: 6 %
Neutro Abs: 2.7 10*3/uL (ref 1.7–7.7)
Neutrophils Relative %: 71 %
Platelets: 150 10*3/uL (ref 150–400)
RBC: 3.35 MIL/uL — ABNORMAL LOW (ref 3.87–5.11)
RDW: 13.4 % (ref 11.5–15.5)
WBC: 3.8 10*3/uL — ABNORMAL LOW (ref 4.0–10.5)
nRBC: 0 % (ref 0.0–0.2)

## 2019-02-08 LAB — COMPREHENSIVE METABOLIC PANEL
ALT: 21 U/L (ref 0–44)
AST: 27 U/L (ref 15–41)
Albumin: 3.1 g/dL — ABNORMAL LOW (ref 3.5–5.0)
Alkaline Phosphatase: 99 U/L (ref 38–126)
Anion gap: 11 (ref 5–15)
BUN: 12 mg/dL (ref 8–23)
CO2: 21 mmol/L — ABNORMAL LOW (ref 22–32)
Calcium: 8.8 mg/dL — ABNORMAL LOW (ref 8.9–10.3)
Chloride: 109 mmol/L (ref 98–111)
Creatinine, Ser: 0.75 mg/dL (ref 0.44–1.00)
GFR calc Af Amer: >60 mL/min (ref >60)
GFR calc non Af Amer: >60 mL/min (ref >60)
Glucose, Bld: 106 mg/dL — ABNORMAL HIGH (ref 70–99)
Potassium: 3.1 mmol/L — ABNORMAL LOW (ref 3.5–5.1)
Sodium: 141 mmol/L (ref 135–145)
Total Bilirubin: 0.3 mg/dL (ref 0.3–1.2)
Total Protein: 6.6 g/dL (ref 6.5–8.1)

## 2019-02-08 LAB — D-DIMER, QUANTITATIVE: D-Dimer, Quant: 3.58 ug/mL-FEU — ABNORMAL HIGH (ref 0.00–0.50)

## 2019-02-08 LAB — C-REACTIVE PROTEIN: CRP: 2.8 mg/dL — ABNORMAL HIGH (ref <1.0)

## 2019-02-08 MED ORDER — ENSURE ENLIVE PO LIQD
237.0000 mL | Freq: Three times a day (TID) | ORAL | Status: DC
  Administered 2019-02-08 – 2019-02-11 (×8): 237 mL via ORAL

## 2019-02-08 MED ORDER — ADULT MULTIVITAMIN W/MINERALS CH
1.0000 | ORAL_TABLET | Freq: Every day | ORAL | Status: DC
  Administered 2019-02-08 – 2019-02-11 (×4): 1 via ORAL
  Filled 2019-02-08 (×4): qty 1

## 2019-02-08 MED ORDER — SODIUM CHLORIDE 0.45 % IV SOLN
INTRAVENOUS | Status: DC
  Administered 2019-02-08 – 2019-02-10 (×3): via INTRAVENOUS
  Filled 2019-02-08 (×6): qty 1000

## 2019-02-08 MED ORDER — TIMOLOL MALEATE 0.5 % OP SOLN
1.0000 [drp] | Freq: Every day | OPHTHALMIC | Status: DC
  Administered 2019-02-08 – 2019-02-11 (×4): 1 [drp] via OPHTHALMIC
  Filled 2019-02-08: qty 5

## 2019-02-08 NOTE — Progress Notes (Signed)
Occupational Therapy Evaluation Patient Details Name: Shelly Cervantes MRN: [id number] DOB: [birth year] Today's Date: 02/08/2019    History of Present Illness 83 y.o. female with a history of dementia, bipolar disorder, HTN, and recent admission for hip fracture who was sent from SNF due to vomiting, and reported hypoxia and hypotension. She was admitted 10/8 - 10/17 and discharged to SNF for rehabilitation following hip fracture. Covid +   Clinical Impression   PTA, pt undergoing rehab at [facility] s/p L hip fx. Prior to hip fx, pt living at home with her husband. PT states she enjoys "shopping". Max A +2 to mobilize to EOB; HHA +2MAx A for [facility] transfer; Total A with pericare and MAx A +2 for BSC - recliner. Pt orthostatic and mobilized to recliner.Nsg made aware.  Orthostatic BPs  Supine 152/64  Sitting 136/91  Sitting after 3 min 94/52  Standing NA  Standing after  min   Pt will need continued rehab at SNF. Will follow acutely. Recommend pt OOB daily with nsg with use of Stedy.    Follow Up Recommendations  SNF;Supervision/Assistance - 24 hour    Equipment Recommendations  None recommended by OT    Recommendations for Other Services       Precautions / Restrictions Precautions Precautions: Fall Restrictions LLE Weight Bearing: Weight bearing as tolerated      Mobility Bed Mobility Overal bed mobility: Needs Assistance Bed Mobility: Supine to Sit     Supine to sit: +2 for physical assistance;Max assist     General bed mobility comments: Pt initiated leg movement off bed. Bed pad eventaully used adn Max A to tranistion trunk upright due to cognitive deficits and level of arousall  Transfers Overall transfer level: Needs assistance Equipment used: Rolling walker (2 wheeled);2 person hand held assist   Sit to Stand: Max assist;+2 physical assistance Stand pivot transfers: Max assist;+2 physical assistance       General transfer comment: PT reaching for therapist;  unable to achieve full upright standing posture with use of RW    Balance     Sitting balance-Leahy Scale: Poor Sitting balance - Comments: posterior boas     Standing balance-Leahy Scale: Zero                             ADL either performed or assessed with clinical judgement   ADL Overall ADL's : Needs assistance/impaired Eating/Feeding: Maximal assistance   Grooming: Maximal assistance   Upper Body Bathing: Maximal assistance;Sitting   Lower Body Bathing: Sit to/from stand;Maximal assistance   Upper Body Dressing : Maximal assistance;Sitting   Lower Body Dressing: Total assistance;Sit to/from stand   Toilet Transfer: Maximal assistance;+2 for physical assistance;Stand-pivot   Toileting- Clothing Manipulation and Hygiene: Total assistance Toileting - Clothing Manipulation Details (indicate cue type and reason): incontinenet     Functional mobility during ADLs: +2 for physical assistance;Cueing for safety;Cueing for sequencing;Maximal assistance General ADL Comments: PT reaching for therapist. Difficulty using RW; difficulty achieveing full upright posture; level of arousal affecting self care     Vision   Additional Comments: eyes closed majority of session     Perception     Praxis      Pertinent Vitals/Pain Pain Assessment: Faces Faces Pain Scale: Hurts even more Pain Location: hip and abdomen Pain Descriptors / Indicators: Discomfort;Moaning Pain Intervention(s): Limited activity within patient's tolerance     Hand Dominance Right   Extremity/Trunk Assessment Upper Extremity Assessment Upper Extremity  Assessment: Generalized weakness(difficulty with self care due to weakness; jerking movements)   Lower Extremity Assessment Lower Extremity Assessment: Defer to PT evaluation   Cervical / Trunk Assessment Cervical / Trunk Assessment: Kyphotic;Other exceptions(forward head)   Communication Communication Communication: No difficulties    Cognition Arousal/Alertness: Lethargic Behavior During Therapy: Restless;Flat affect Overall Cognitive Status: No family/caregiver present to determine baseline cognitive functioning    Pt oriented to self lony; unaware of having a hip fx or being in the hospital. Thinks she is at home. Slow processing; difficulty with following 1 step commands - follows with increased time; most likely also affected by orthostasis                             General Comments: oriented to self only; unaware she has a broken hip   General Comments       Exercises     Shoulder Instructions      Home Living Family/patient expects to be discharged to:: Skilled nursing facility                                        Prior Functioning/Environment Level of Independence: Needs assistance        Comments: prior to hip fx, pt living at home with her husband, walking wihtout AD; multiple falls        OT Problem List: Decreased strength;Decreased range of motion;Decreased activity tolerance;Impaired balance (sitting and/or standing);Decreased coordination;Decreased cognition;Decreased safety awareness;Decreased knowledge of use of DME or AE;Cardiopulmonary status limiting activity;Pain      OT Treatment/Interventions: Self-care/ADL training;Therapeutic exercise;Neuromuscular education;Energy conservation;DME and/or AE instruction;Therapeutic activities;Cognitive remediation/compensation;Patient/family education;Balance training    OT Goals(Current goals can be found in the care plan section) Acute Rehab OT Goals Patient Stated Goal: to rest OT Goal Formulation: Patient unable to participate in goal setting Time For Goal Achievement: 02/22/19 Potential to Achieve Goals: Good  OT Frequency: Min 2X/week   Barriers to D/C:            Co-evaluation              AM-PAC OT "6 Clicks" Daily Activity     Outcome Measure Help from another person eating meals?: A  Lot Help from another person taking care of personal grooming?: A Lot Help from another person toileting, which includes using toliet, bedpan, or urinal?: Total Help from another person bathing (including washing, rinsing, drying)?: A Lot Help from another person to put on and taking off regular upper body clothing?: A Lot Help from another person to put on and taking off regular lower body clothing?: Total 6 Click Score: 10   End of Session Equipment Utilized During Treatment: Rolling walker Nurse Communication: Mobility status;Need for lift equipment;Other (comment)(STedy; orthostatic)  Activity Tolerance: Patient tolerated treatment well Patient left: in chair;with call bell/phone within reach;with chair alarm set  OT Visit Diagnosis: Other abnormalities of gait and mobility (R26.89);Muscle weakness (generalized) (M62.81);Other symptoms and signs involving cognitive function;Pain;Feeding difficulties (R63.3) Pain - Right/Left: Left Pain - part of body: Hip(abdomen)                Time: [id number] OT Time Calculation (min): 40 min Charges:  OT General Charges $OT Visit: 1 Visit OT Evaluation $OT Eval Moderate Complexity: 1 Mod OT Treatments $Self Care/Home Management : 8-22 mins  Romie Keeble, OT/L   Acute OT  Clinical Specialist [place] Pager [phone] Office [phone]   [facility] 02/08/2019, 12:49 PM

## 2019-02-08 NOTE — NC FL2 (Signed)
[place] MEDICAID FL2 LEVEL OF CARE SCREENING TOOL     IDENTIFICATION  Patient Name: Shelly Cervantes Birthdate: [birth year] Sex: female Admission Date (Current Location): 02/06/2019  [facility] and [place] Number:  Herbalist and Address:  [facility] [place], [place] [address], [place], [place] [address]      Provider Number: [id number]  Attending Physician Name and Address:  Patrecia Pour, MD  Relative Name and Phone Number:       Current Level of Care: Hospital Recommended Level of Care: [place] Prior Approval Number:    Date Approved/Denied:   PASRR Number: [id number]  Discharge Plan: Home    Current Diagnoses: Patient Active Problem List   Diagnosis Date Noted  . Pressure injury of skin 02/07/2019  . PNA (pneumonia) 02/06/2019  . COVID-19 02/06/2019  . Cerebral thrombosis with cerebral infarction 01/22/2019  . Closed fracture of left hip ([place]) 01/18/2019  . Hypokalemia 01/18/2019  . Hypertensive urgency 01/18/2019  . Prolonged QT interval 01/18/2019  . Fall   . Late onset Alzheimer's disease without behavioral disturbance ([place]) 12/07/2018  . Osteopenia 12/08/2016  . Malignant neoplasm of upper-outer quadrant of left breast in female, estrogen receptor positive ([place]) 10/14/2016    Orientation RESPIRATION BLADDER Height & Weight     Self  Normal External catheter Weight: 100 lb 1.4 oz (45.4 kg) Height:  5\' 5"  (165.1 cm)  BEHAVIORAL SYMPTOMS/MOOD NEUROLOGICAL BOWEL NUTRITION STATUS        Diet(See dc summary)  AMBULATORY STATUS COMMUNICATION OF NEEDS Skin   Extensive Assist   Other (Comment)(Stage 2 pressure wound- buttocks, Closed incision- left hip)                       Personal Care Assistance Level of Assistance  Bathing, Feeding, Dressing Bathing Assistance: Maximum assistance Feeding assistance: Maximum assistance Dressing Assistance: Limited assistance     Functional Limitations Info              SPECIAL CARE FACTORS FREQUENCY  PT (By licensed PT), OT (By licensed OT)     PT Frequency: 5 OT Frequency: 5            Contractures      Additional Factors Info  Code Status, Allergies Code Status Info: DNR Allergies Info: Codeine Penicillins           Current Medications (02/08/2019):  This is the current hospital active medication list Current Facility-Administered Medications  Medication Dose Route Frequency Provider Last Rate Last Dose  . amLODipine (NORVASC) tablet 5 mg  5 mg Oral Daily Nita Sells, MD   5 mg at 02/08/19 0839  . aspirin chewable tablet 81 mg  81 mg Oral QHS Nita Sells, MD   81 mg at 02/07/19 2142  . buPROPion (WELLBUTRIN XL) 24 hr tablet 150 mg  150 mg Oral Daily Nita Sells, MD   150 mg at 02/08/19 0838  . donepezil (ARICEPT) tablet 5 mg  5 mg Oral QHS Nita Sells, MD   5 mg at 02/07/19 2142  . enoxaparin (LOVENOX) injection 30 mg  30 mg Subcutaneous Q24H Nita Sells, MD   30 mg at 02/08/19 0839  . feeding supplement (ENSURE ENLIVE) (ENSURE ENLIVE) liquid 237 mL  237 mL Oral TID BM Patrecia Pour, MD      . memantine [facility]) tablet 10 mg  10 mg Oral Daily Nita Sells, MD   10 mg at 02/08/19 [id number]  .  multivitamin with minerals tablet 1 tablet  1 tablet Oral Daily Vance Gather B, MD      . pantoprazole (PROTONIX) EC tablet 40 mg  40 mg Oral Daily Nita Sells, MD   40 mg at 02/08/19 0838  . sertraline (ZOLOFT) tablet 100 mg  100 mg Oral Daily Nita Sells, MD   100 mg at 02/08/19 0838  . sodium chloride 0.45 % 1,000 mL with potassium chloride 40 mEq infusion   Intravenous Continuous Patrecia Pour, MD 75 mL/hr at 02/08/19 [phone]    . timolol (TIMOPTIC) 0.5 % ophthalmic solution 1 drop  1 drop Both Eyes Daily Vance Gather B, MD      . traZODone (DESYREL) tablet 50 mg  50 mg Oral QHS Nita Sells, MD   50 mg at 02/07/19 2142     Discharge Medications: Please see discharge  summary for a list of discharge medications.  Relevant Imaging Results:  Relevant Lab Results:   Additional Information SSN: [id number]  Weston Anna, LCSW

## 2019-02-08 NOTE — Evaluation (Signed)
Clinical/Bedside Swallow Evaluation Patient Details  Name: Shelly Cervantes MRN: [id number] Date of Birth: 1935/09/01  Today's Date: 02/08/2019 Time: SLP Start Time (ACUTE ONLY): 21 SLP Stop Time (ACUTE ONLY): 1105 SLP Time Calculation (min) (ACUTE ONLY): 15 min  Past Medical History:  Past Medical History:  Diagnosis Date  . Alzheimer's disease ([place])   . Anxiety   . Breast cancer ([place])    left  . Depression   . GERD (gastroesophageal reflux disease)    occ  . High cholesterol   . Hypertension    Past Surgical History:  Past Surgical History:  Procedure Laterality Date  . BREAST LUMPECTOMY WITH RADIOACTIVE SEED AND SENTINEL LYMPH NODE BIOPSY Left 11/25/2016   Procedure: LEFT BREAST LUMPECTOMY WITH RADIOACTIVE SEED AND AXILLARY SENTINEL LYMPH NODE BIOPSY;  Surgeon: Excell Seltzer, MD;  Location: [place];  Service: General;  Laterality: Left;  . EYE SURGERY Bilateral    cataracts  . FEMUR IM NAIL Left 01/19/2019   Procedure: INTRAMEDULLARY (IM) NAIL FEMORAL;  Surgeon: Nicholes Stairs, MD;  Location: [place];  Service: Orthopedics;  Laterality: Left;   HPI:  83 y.o. female with a history of dementia, bipolar disorder, HTN, and recent admission to [facility] 10/8-17 for hip fracture . D/Cd to SNF for rehab and readmitted 10/27 from SNF due to vomiting and reported hypoxia and hypotension.  Tested positive for COVID 19 and was transferred to [place].  During prior admission for hip fx, pt was evaluated by SLP and followed briefly at [facility] for swallowing - at time of D/C she was advanced to a regular diet with thin liquids with no f/u recommended.     Assessment / Plan / Recommendation Clinical Impression  Pt alert, confused, but able to participate in clinical swallow assessment.  Oral mechanism is WNL.  She follows simple commands, oriented to person, not place/time.  Demonstrates adequate anticipation of bolus, attention, and mastication. Swallow response is swift.  There are no overt s/s of  aspiration.  Respirations are WNL and not impacting swallowing safety. Pt needs assistance with tray set-up and self-feeding due to dementia.  No dysphagia identified.  Our service will sign off.  SLP Visit Diagnosis: Dysphagia, unspecified (R13.10)    Aspiration Risk    mild   Diet Recommendation   regular solids, thin liquids  Medication Administration: Whole meds with liquid    Other  Recommendations Oral Care Recommendations: Oral care BID   Follow up Recommendations None      Frequency and Duration     n/a               Swallow Study   General HPI: 83 y.o. female with a history of dementia, bipolar disorder, HTN, and recent admission to [facility] 10/8-17 for hip fracture . D/Cd to SNF for rehab and readmitted 10/27 from SNF due to vomiting and reported hypoxia and hypotension.  Tested positive for COVID 19 and was transferred to [place].  During prior admission for hip fx, pt was evaluated by SLP and followed briefly at [facility] for swallowing - at time of D/C she was advanced to a regular diet with thin liquids with no f/u recommended.   Type of Study: Bedside Swallow Evaluation Diet Prior to this Study: Dysphagia 3 (soft);Thin liquids Temperature Spikes Noted: No Respiratory Status: Room air History of Recent Intubation: No Behavior/Cognition: Confused Oral Cavity Assessment: Within Functional Limits Oral Care Completed by SLP: No Oral Cavity - Dentition: Adequate natural dentition Vision: Functional for self-feeding Self-Feeding  Abilities: Needs assist Patient Positioning: Upright in bed Baseline Vocal Quality: Normal Volitional Cough: Strong Volitional Swallow: Able to elicit    Oral/Motor/Sensory Function Overall Oral Motor/Sensory Function: Within functional limits   Ice Chips Ice chips: Within functional limits   Thin Liquid Thin Liquid: Within functional limits Presentation: Cup;Straw    Nectar Thick Nectar Thick Liquid: Not tested   Honey Thick Honey Thick Liquid: Not  tested   Puree Puree: Within functional limits   Solid     Solid: Within functional limits      Juan Quam Laurice 02/08/2019,11:06 AM  Estill Bamberg L. Tivis Ringer, [place] Office number [phone]

## 2019-02-08 NOTE — Plan of Care (Addendum)
Spoke with husband to update on POC and patinet current status.  No further questions at this time.  He also asked if ok to "facetime" with wife, yet he only has an android - Stated I could use "duo" to call him and that would work (I told him, "unfortunately, we're unable download any apps onto our iphones, so I would not be able to accommodate this request at this time."  "Main contact" changed to daughter, per husband's request.  Spoke with daughter and helped facetime w/ mom.

## 2019-02-08 NOTE — Progress Notes (Addendum)
PROGRESS NOTE  Shelly Cervantes  [id number] DOB: 1935-04-16 DOA: 02/06/2019 PCP: [name], MD   Brief Narrative: Shelly Cervantes is an 83 y.o. female with a history of dementia, bipolar disorder, HTN, and recent admission for hip fracture who was sent from SNF due to vomiting, and reported hypoxia and hypotension. She was admitted 10/8 - 10/17 and discharged to SNF for rehabilitation following hip fracture. On evaluation in the ED she was not hypoxic with stable vital signs. CT head was performed showing no acute changes. CTA chest ruled out pulmonary embolism, but did show GGOs in posterior right lower lobe. In that clinical situation, aspiration was suspected though CRP was not elevated, procalcitonin was undetectable, CBC was normal and the patient had no respiratory symptoms. Lipase was modestly elevated though abdominal exam was benign. Gallbladder noted to be absent on CT chest and pancreas with no definite abnormalities. AST, ALT, and bilirubin were within normal limits. Ceftriaxone, azithromycin and IV fluids provided. Ultimately SARS-CoV-2 screen was positive, so patient admitted to [facility] though no further symptoms have developed.   Assessment & Plan: Active Problems:   PNA (pneumonia)   COVID-19   Pressure injury of skin  Pneumonia: Unclear etiology of relatively localized GGOs on CT. High risk for aspiration though blood work highly suggestive of no bacterial infection or significant inflammation from covid-19 at admission.  - Monitor off targeted Tx for now, received CTX, azithromycin in ED - SLP evaluation  Covid-19 infection: Possible cause of GI symptoms which have improved.  - CRP up today in absence of fever or other symptoms, so will monitor again x24 hrs. - Reserve targeted Tx for now since no hypoxia or respiratory symptoms.  - Airborne/contact precautions - Cultures drawn on admission.   Elevated d-dimer: Commonly seen during covid, stable.  - Check LE U/S,  preliminary no evidence of DVT. - CTA chest without PE.   AKI with lactic acidosis: Improving Cr 1.27 > 0.91 > 0.75.  - Hold ACEi - Given IV fluids in ED. Was given the attempt to eat on her own but not taking much, 20% this AM. Continue IVf.  Lipase elevation: With benign, nontender abdomen and ability to tolerate po intake, pancreatitis felt to be unlikely and lipase elevation which is improving more likely due to vomiting. Alkaline phosphatase normalized - Monitor  Pancytopenia: Suspect hemoconcentrated on admission. No bleeding reported. Possibly due to viral infection.  - Continue monitoring  Hypokalemia:  - Increase supplementation in IVF pending SLP evaluation.  Alzheimer's dementia, bipolar disorder:  - Continue home medications - Delirium precautions - Monitor  Hip fracture:  - Outpatient follow up.  - Remove staples. - PT/OT to continue while admitted  Glaucoma:  - Continue gtts  HTN:  - Continue norvasc, hold lisinopril as above  Left facial droop: Not new, no acute findings on CT head  Thrombocytopenia: Mild, improved. Likely covid effect.  Underweight:  - Dietitian consulted given poor per oral intake, need to heal wounds.  - Note on megace as outpatient, not continued while inpatient.  Pressure Injuries: Pressure Injury 02/07/19 Buttocks Left;Upper Stage II -  Partial thickness loss of dermis presenting as a shallow open ulcer with a red, pink wound bed without slough. pink, skin broken but scabing  (Active)  02/07/19 1635  Location: Buttocks  Location Orientation: Left;Upper  Staging: Stage II -  Partial thickness loss of dermis presenting as a shallow open ulcer with a red, pink wound bed without slough.  Wound Description (  Comments): pink, skin broken but scabing   Present on Admission:      Pressure Injury 02/07/19 Buttocks Left;Lower Stage II -  Partial thickness loss of dermis presenting as a shallow open ulcer with a red, pink wound bed without  slough. (Active)  02/07/19 1600  Location: Buttocks  Location Orientation: Left;Lower  Staging: Stage II -  Partial thickness loss of dermis presenting as a shallow open ulcer with a red, pink wound bed without slough.  Wound Description (Comments):   Present on Admission:      Pressure Injury 02/07/19 Buttocks Left Stage II -  Partial thickness loss of dermis presenting as a shallow open ulcer with a red, pink wound bed without slough. (Active)  02/07/19 1600  Location: Buttocks  Location Orientation: Left  Staging: Stage II -  Partial thickness loss of dermis presenting as a shallow open ulcer with a red, pink wound bed without slough.  Wound Description (Comments):   Present on Admission:     DVT prophylaxis: Lovenox Code Status: DNR Family Communication: None at bedside. Will call husband for updates today. Disposition Plan: SNF once improving. DC tele. SLP pending.  Consultants:   None  Procedures:   None  Antimicrobials:  Ceftriaxone, azithromycin x1  Subjective: Remains confused but conversant without complaints. No dyspnea, pain anywhere.   Objective: Vitals:   02/07/19 2000 02/08/19 0000 02/08/19 0300 02/08/19 0758  BP: (!) 136/56  140/62 (!) 147/74  Pulse:    83  Resp:   20 20  Temp: 98.9 F (37.2 C) 98.5 F (36.9 C) 98.5 F (36.9 C) 98.9 F (37.2 C)  TempSrc: Oral Oral Oral Oral  SpO2:    98%  Weight:      Height:        Intake/Output Summary (Last 24 hours) at 02/08/2019 [id number] Last data filed at 02/08/2019 [id number] Gross per 24 hour  Intake 2056.65 ml  Output 300 ml  Net 1756.65 ml   Filed Weights   02/06/19 1152  Weight: 45.4 kg   Gen: Elderly female in no distress Pulm: Nonlabored breathing room air. Clear. CV: Regular rate and rhythm. No murmur, rub, or gallop. No JVD, no dependent edema. GI: Abdomen soft, non-tender, non-distended, with normoactive bowel sounds.  Ext: Warm, no deformities Skin: No new rashes, lesions or ulcers on  visualized skin. Neuro: Alert, conversant, disoriented. No focal neurological deficits. Psych: Judgement and insight appear impaired. Mood euthymic & affect congruent. Behavior is appropriate.    Telemetry personally reviewed this morning for duration of admission showing no significant arrhythmias.   Data Reviewed: I have personally reviewed following labs and imaging studies  CBC: Recent Labs  Lab 02/06/19 1036 02/07/19 0330 02/07/19 0500 02/08/19 0335  WBC 6.0 THIS TEST WAS ORDERED IN ERROR AND HAS BEEN CREDITED. 3.6* 3.8*  NEUTROABS 4.2 THIS TEST WAS ORDERED IN ERROR AND HAS BEEN CREDITED. 2.1 2.7  HGB 12.2 THIS TEST WAS ORDERED IN ERROR AND HAS BEEN CREDITED. 9.3* 9.8*  HCT 39.0 THIS TEST WAS ORDERED IN ERROR AND HAS BEEN CREDITED. 29.6* 30.9*  MCV 93.1 THIS TEST WAS ORDERED IN ERROR AND HAS BEEN CREDITED. 93.4 92.2  PLT 230 THIS TEST WAS ORDERED IN ERROR AND HAS BEEN CREDITED. 147* [id number]   Basic Metabolic Panel: Recent Labs  Lab 02/06/19 1036 02/07/19 0330 02/08/19 0335  NA 134* 138 141  K 4.2 3.3* 3.1*  CL 102 111 109  CO2 19* 18* 21*  GLUCOSE 130* 72 106*  BUN 27* 23  12  CREATININE 1.27* 0.91 0.75  CALCIUM 9.1 8.2* 8.8*   GFR: Estimated Creatinine Clearance: 39.5 mL/min (by C-G formula based on SCr of 0.75 mg/dL). Liver Function Tests: Recent Labs  Lab 02/06/19 1036 02/07/19 0330 02/08/19 0335  AST 39 30 27  ALT 30 23 21   ALKPHOS 130* 97 99  BILITOT 0.1* 0.2* 0.3  PROT 6.9 5.7* 6.6  ALBUMIN 3.2* 2.7* 3.1*   Recent Labs  Lab 02/06/19 1036 02/07/19 0330  LIPASE 166* 94*   No results for input(s): AMMONIA in the last 168 hours. Coagulation Profile: No results for input(s): INR, PROTIME in the last 168 hours. Cardiac Enzymes: No results for input(s): CKTOTAL, CKMB, CKMBINDEX, TROPONINI in the last 168 hours. BNP (last 3 results) No results for input(s): PROBNP in the last 8760 hours. HbA1C: No results for input(s): HGBA1C in the last 72  hours. CBG: Recent Labs  Lab 02/06/19 1150  GLUCAP 101*   Lipid Profile: No results for input(s): CHOL, HDL, LDLCALC, TRIG, CHOLHDL, LDLDIRECT in the last 72 hours. Thyroid Function Tests: No results for input(s): TSH, T4TOTAL, FREET4, T3FREE, THYROIDAB in the last 72 hours. Anemia Panel: Recent Labs    02/06/19 1758  FERRITIN 736*   Urine analysis: No results found for: COLORURINE, APPEARANCEUR, LABSPEC, PHURINE, GLUCOSEU, HGBUR, BILIRUBINUR, KETONESUR, PROTEINUR, UROBILINOGEN, NITRITE, LEUKOCYTESUR Recent Results (from the past 240 hour(s))  SARS CORONAVIRUS 2 (TAT 6-24 HRS) Nasopharyngeal Nasopharyngeal Swab     Status: Abnormal   Collection Time: 02/06/19 12:46 PM   Specimen: Nasopharyngeal Swab  Result Value Ref Range Status   SARS Coronavirus 2 POSITIVE (A) NEGATIVE Final    Comment: RESULT CALLED TO, READ BACK BY AND VERIFIED WITH: H VANKRETSCHMAR,RN 1723 02/06/2019 D BRADLEY (NOTE) SARS-CoV-2 target nucleic acids are DETECTED. The SARS-CoV-2 RNA is generally detectable in upper and lower respiratory specimens during the acute phase of infection. Positive results are indicative of active infection with SARS-CoV-2. Clinical  correlation with patient history and other diagnostic information is necessary to determine patient infection status. Positive results do  not rule out bacterial infection or co-infection with other viruses. The expected result is Negative. Fact Sheet for Patients: SugarRoll.be Fact Sheet for Healthcare Providers: https://www.woods-mathews.com/ This test is not yet approved or cleared by the [place] FDA and  has been authorized for detection and/or diagnosis of SARS-CoV-2 by FDA under an Emergency Use Authorization (EUA). This EUA will remain  in effect (meaning this test can be [place] ed) for the duration of the COVID-19 declaration under Section 564(b)(1) of the Act, 21 U.S.C. section 360bbb-3(b)(1),  unless the authorization is terminated or revoked sooner. Performed at [facility], [place] [address] [place], [place] [address]   Blood culture (routine x 2)     Status: None (Preliminary result)   Collection Time: 02/06/19  5:58 PM   Specimen: BLOOD  Result Value Ref Range Status   Specimen Description BLOOD LEFT ANTECUBITAL  Final   Special Requests   Final    BOTTLES DRAWN AEROBIC AND ANAEROBIC Blood Culture adequate volume   Culture   Final    NO GROWTH 2 DAYS Performed at [facility], [place] [address], [place], [place] [address]    Report Status PENDING  Incomplete  Blood culture (routine x 2)     Status: None (Preliminary result)   Collection Time: 02/06/19  6:07 PM   Specimen: BLOOD LEFT FOREARM  Result Value Ref Range Status   Specimen Description BLOOD LEFT FOREARM  Final   Special Requests   Final    BOTTLES DRAWN AEROBIC ONLY Blood Culture results may not be optimal due to an inadequate volume of blood received in culture bottles   Culture   Final    NO GROWTH 2 DAYS Performed at [facility], [place] [address] [place], [place] [address]    Report Status PENDING  Incomplete      Radiology Studies: Ct Head Wo Contrast  Result Date: 02/06/2019 CLINICAL DATA:  Syncopal episode EXAM: CT HEAD WITHOUT CONTRAST TECHNIQUE: Contiguous axial images were obtained from the base of the skull through the vertex without intravenous contrast. COMPARISON:  January 21, 2019 FINDINGS: Brain: There is stable disproportionate enlargement of the ventricles. Patchy and confluent areas of hypoattenuation in the supratentorial white matter are nonspecific but likely reflect stable chronic microvascular ischemic changes. There is no acute intracranial hemorrhage, mass-effect, or edema. Gray-white differentiation is preserved. There is no extra-axial fluid collection. Vascular: No hyperdense vessel or unexpected calcification.There is atherosclerotic calcification at the skull base.  Skull: Calvarium is unremarkable. Sinuses/Orbits: No acute finding. Other: None. IMPRESSION: No acute intracranial hemorrhage, mass effect, or evidence of acute infarction. Stable chronic findings as detailed above. Electronically Signed   By: Macy Mis M.D.   On: 02/06/2019 11:47   Ct Angio Chest Pe W Or Wo Contrast  Result Date: 02/06/2019 CLINICAL DATA:  Shortness of breath with altered mental status. History of breast carcinoma EXAM: CT ANGIOGRAPHY CHEST WITH CONTRAST TECHNIQUE: Multidetector CT imaging of the chest was performed using the standard protocol during bolus administration of intravenous contrast. Multiplanar CT image reconstructions and MIPs were obtained to evaluate the vascular anatomy. CONTRAST:  46 mL OMNIPAQUE IOHEXOL 350 MG/ML SOLN COMPARISON:  Chest radiograph February 06, 2016 FINDINGS: Cardiovascular: There is no demonstrable pulmonary embolus. There is no thoracic aortic aneurysm. There is no appreciable thoracic aortic dissection. The contrast bolus in the aorta is somewhat less than optimal for dissection assessment. There is aortic atherosclerosis. There are foci of calcification in proximal visualized great vessels. There is no pericardial effusion or pericardial thickening. There are foci of coronary artery calcification. Mediastinum/Nodes: Thyroid appears unremarkable. There is no appreciable thoracic adenopathy. There is a focal hiatal hernia. Lungs/Pleura: There is a degree of underlying centrilobular emphysematous change. No evident pneumothorax. There is ground-glass type opacity in much of the right lower lobe with more subtle areas of ground-glass opacity in the posterior segment of the left lower lobe and in the posterior aspect of the lateral segment right middle lobe. There is no consolidation. No pleural effusion. Upper Abdomen: Gallbladder is absent. There is upper abdominal aortic atherosclerosis. Visualized upper abdominal structures otherwise appear  unremarkable. Musculoskeletal: There is anterior wedging of the T12 and T10 vertebral bodies. There are no blastic or lytic bone lesions. No chest wall lesions are evident. Review of the MIP images confirms the above findings. IMPRESSION: 1. No demonstrable pulmonary embolus. No thoracic aortic aneurysm. No dissection evident with the proviso that contrast bolus in the aorta is less than optimal for dissection assessment. There is aortic atherosclerosis as well as foci of great vessel and coronary artery calcification. 2. Ground-glass type opacity throughout much of the posterior segment of the right lower lobe and to a lesser extent more medially in the right lower lobe. Small foci of ground-glass opacity in the lateral segment right middle lobe and in the posterior segment left lower lobe. Concern for atypical organism pneumonia. No frank consolidation. No pleural effusion.  Mild underlying centrilobular emphysematous change. 3.  No evident thoracic adenopathy. 4.  Focal hiatal hernia present. 5.  Gallbladder absent. Aortic Atherosclerosis (ICD10-I70.0) and Emphysema (ICD10-J43.9). Electronically Signed   By: Lowella Grip III M.D.   On: 02/06/2019 15:21   Dg Chest Portable 1 View  Result Date: 02/06/2019 CLINICAL DATA:  Loss of consciousness, history of breast cancer, Alzheimer's, hypertension EXAM: PORTABLE CHEST 1 VIEW COMPARISON:  Portable exam 1104 hours compared to old 01/18/2019 FINDINGS: Normal heart size, mediastinal contours, and pulmonary vascularity. Atherosclerotic calcification aorta. Bronchitic changes and hyperinflation question COPD. No infiltrate, pleural effusion or pneumothorax. Bones demineralized with dextroconvex thoracolumbar scoliosis. IMPRESSION: Probable COPD changes without acute infiltrate. Aortic atherosclerosis. Electronically Signed   By: Lavonia Dana M.D.   On: 02/06/2019 11:15   Vas [place] Lower Extremity Venous (dvt)  Result Date: 02/07/2019  Lower Venous Study  Indications: Edema, and Covid Positive with elevated d-dimer.  Comparison Study: no priors. Performing Technologist: Oda Cogan RDMS, RVT  Examination Guidelines: A complete evaluation includes B-mode imaging, spectral Doppler, color Doppler, and power Doppler as needed of all accessible portions of each vessel. Bilateral testing is considered an integral part of a complete examination. Limited examinations for reoccurring indications may be performed as noted.  +---------+---------------+---------+-----------+----------+--------------+  RIGHT     Compressibility Phasicity Spontaneity Properties Thrombus Aging  +---------+---------------+---------+-----------+----------+--------------+  CFV       Full            Yes       Yes                                    +---------+---------------+---------+-----------+----------+--------------+  SFJ       Full                                                             +---------+---------------+---------+-----------+----------+--------------+  FV Prox   Full                                                             +---------+---------------+---------+-----------+----------+--------------+  FV Mid    Full                                                             +---------+---------------+---------+-----------+----------+--------------+  FV Distal Full                                                             +---------+---------------+---------+-----------+----------+--------------+  PFV       Full                                                             +---------+---------------+---------+-----------+----------+--------------+  POP       Full            Yes       Yes                                    +---------+---------------+---------+-----------+----------+--------------+  PTV       Full                                                             +---------+---------------+---------+-----------+----------+--------------+  PERO      Full                                                              +---------+---------------+---------+-----------+----------+--------------+   +---------+---------------+---------+-----------+----------+--------------+  LEFT      Compressibility Phasicity Spontaneity Properties Thrombus Aging  +---------+---------------+---------+-----------+----------+--------------+  CFV       Full            Yes       Yes                                    +---------+---------------+---------+-----------+----------+--------------+  SFJ       Full                                                             +---------+---------------+---------+-----------+----------+--------------+  FV Prox   Full                                                             +---------+---------------+---------+-----------+----------+--------------+  FV Mid    Full                                                             +---------+---------------+---------+-----------+----------+--------------+  FV Distal Full                                                             +---------+---------------+---------+-----------+----------+--------------+  PFV       Full                                                             +---------+---------------+---------+-----------+----------+--------------+  POP       Full            Yes       Yes                                    +---------+---------------+---------+-----------+----------+--------------+  PTV       Full                                                             +---------+---------------+---------+-----------+----------+--------------+  PERO      Full                                                             +---------+---------------+---------+-----------+----------+--------------+     Summary: Right: There is no evidence of deep vein thrombosis in the lower extremity. No cystic structure found in the popliteal fossa. Left: There is no evidence of deep vein thrombosis in the lower extremity. No cystic structure  found in the popliteal fossa.  *See table(s) above for measurements and observations.    Preliminary     Scheduled Meds:  amLODipine  5 mg Oral Daily   aspirin  81 mg Oral QHS   buPROPion  150 mg Oral Daily   donepezil  5 mg Oral QHS   enoxaparin  30 mg Subcutaneous Q24H   feeding supplement (ENSURE ENLIVE)  237 mL Oral BID BM   memantine  10 mg Oral Daily   pantoprazole  40 mg Oral Daily   sertraline  100 mg Oral Daily   traZODone  50 mg Oral QHS   Continuous Infusions:  sodium chloride 0.45 % with kcl 75 mL/hr at 02/08/19 0837     LOS: 2 days   Time spent: 35 minutes.  Patrecia Pour, MD [facility] [email or web address] 02/08/2019, 9:21 AM

## 2019-02-08 NOTE — Progress Notes (Signed)
 Initial Nutrition Assessment   RD working remotely.   DOCUMENTATION CODES:   Underweight  INTERVENTION:   If aggressive intervention is warranted and aligns with goals of care, pt needs insertion of Cortrak/Small bore NG tube with initiation of supplemental TF  Feeding assistance and encouragement at meal times  Ensure Enlive po TID, each supplement provides 350 kcal and 20 grams of protein  Add MVI with Minerals daily  Pt receiving Hormel Shake daily with Breakfast which provides 520 kcals and 22 g of protein and Magic cup BID with lunch and dinner, each supplement provides 290 kcal and 9 grams of protein, automatically on meal trays to optimize nutritional intake.    NUTRITION DIAGNOSIS:   Inadequate oral intake related to acute illness, poor appetite, lethargy/confusion as evidenced by meal completion < 25%.  GOAL:   Patient will meet greater than or equal to 90% of their needs  MONITOR:   PO intake, Supplement acceptance, Labs, Weight trends, Skin  REASON FOR ASSESSMENT:   Malnutrition Screening Tool    ASSESSMENT:   83 yo female admitted with pneumonia, COVID19 +, AKI. Pt with recent hospitalization with hip fracture. PMH includes dementia, bipolar disorder   Recorded po intake 0-25% of meals; <15% of meals on average. Pt on a Regular Diet, SLP following.   Unable to obtain diet and weight history at this time. Per weight encounters it does appear that pt may have lost weight recently. Unable to perform NFPE as working remotely but pt is very underweight and may very well meet clinical characteristics for severe malnutrition.   Labs: potassium 3.1 (L), Creatinine wdl Meds: 1/2 NS with KCL at 75 ml/hr   NUTRITION - FOCUSED PHYSICAL EXAM:  Unable to assess, working remotely  Diet Order:   Diet Order            Diet regular Room service appropriate? No; Fluid consistency: Thin  Diet effective now              EDUCATION NEEDS:   Not appropriate for  education at this time  Skin:  Skin Assessment: Skin Integrity Issues: Skin Integrity Issues:: Stage II Stage II: multiple on buttock  Last BM:  10/28  Height:   Ht Readings from Last 1 Encounters:  02/06/19 5\' 5"  (1.651 m)    Weight:   Wt Readings from Last 1 Encounters:  02/06/19 45.4 kg    Ideal Body Weight:  56.8 kg  BMI:  Body mass index is 16.66 kg/m.  Estimated Nutritional Needs:   Kcal:  1450-1650 kcals  Protein:  70-80 g  Fluid:  >/= 1.5 L   Mareesa Gathright MS, RDN, LDN, CNSC [phone] Pager  [phone] Weekend/On-Call Pager

## 2019-02-08 NOTE — Evaluation (Signed)
Physical Therapy Evaluation Patient Details Name: Shelly Cervantes MRN: [id number] DOB: 1935-12-02 Today's Date: 02/08/2019   History of Present Illness  83 y/o w/ hx of dementia, alzheimer's disease, anxiety, breast cancer, GERD,  bipolar disoder, HTN, osteopenia, recent admit (10/8-10/17) with L hip fx s/p fall at SNF. Pt had been having increase in falls at SNF at last admission and received IM nailing. presents sec to loss of consciousness,vomitting, hypoxia and hypotension. Pt found to have PNA, cerebral thrombosis with infarction, hypokalemia and COVID +  Clinical Impression   Pt admitted with above diagnosis. PTA was at [facility] and having multi falls. Pt currently with functional limitations due to the deficits listed below (see PT Problem List). She is extremely lethargic during assessment and minimally opens eye to see what is happening in room. Pt is needing max a x 2 for safety, she is minimally able to stand with Max a x 2 and RW, and minimally able to move feet to transfer from bed to [facility]. Pt was noted to be Orthostatic  With oob mobility, supine 152/64, sitting 136/91 sitting on BSC 94/52 and back in recliner with BLE elevated 112/86. Pt is on room air throughout and manages to maintain.  Pt will benefit from skilled PT to increase their independence and safety with mobility to allow discharge to the venue listed below.       Follow Up Recommendations SNF;Supervision/Assistance - 24 hour    Equipment Recommendations  None recommended by PT    Recommendations for Other Services       Precautions / Restrictions Precautions Precautions: Fall;Other (comment)(recent IM nail to L hip) Restrictions Weight Bearing Restrictions: Yes LLE Weight Bearing: Weight bearing as tolerated      Mobility  Bed Mobility Overal bed mobility: Needs Assistance Bed Mobility: Supine to Sit     Supine to sit: +2 for physical assistance;Max assist     General bed mobility comments: Pt initiated leg  movement off bed. Bed pad eventaully used adn Max A to tranistion trunk upright due to cognitive deficits and level of arousall  Transfers Overall transfer level: Needs assistance Equipment used: Rolling walker (2 wheeled);2 person hand held assist Transfers: Sit to/from [facility] Sit to Stand: Max assist;+2 physical assistance Stand pivot transfers: Max assist;+2 physical assistance       General transfer comment: has some diffiuclty with WB on BLE, minimally able to stand, but B kneeds flexed with this, some flexion noted at hips also  Ambulation/Gait Ambulation/Gait assistance: Max assist;+2 physical assistance;+2 safety/equipment Gait Distance (Feet): 3 Feet Assistive device: Rolling walker (2 wheeled) Gait Pattern/deviations: Antalgic;Narrow base of support;Shuffle     General Gait Details: takes few steps at EOB to [facility] poorly  Stairs            Wheelchair Mobility    Modified Rankin (Stroke Patients Only)       Balance Overall balance assessment: Needs assistance Sitting-balance support: Feet supported Sitting balance-Leahy Scale: Poor Sitting balance - Comments: falling backwards and to sides   Standing balance support: (supported by therapist, min BUE support) Standing balance-Leahy Scale: Zero                               Pertinent Vitals/Pain Pain Assessment: Faces Faces Pain Scale: Hurts even more Pain Location: hip and abdomen Pain Descriptors / Indicators: Discomfort;Moaning Pain Intervention(s): Limited activity within patient's tolerance    Home Living Family/patient expects to  be discharged to:: Skilled nursing facility Living Arrangements: Spouse/significant other                    Prior Function Level of Independence: Needs assistance   Gait / Transfers Assistance Needed: was at SNF ans had multi falls this months  ADL's / Homemaking Assistance Needed: needed assist with all  Comments: prior to  hip fx, pt living at home with her husband, walking wihtout AD; multiple falls     Hand Dominance   Dominant Hand: Right    Extremity/Trunk Assessment   Upper Extremity Assessment Upper Extremity Assessment: Defer to OT evaluation    Lower Extremity Assessment Lower Extremity Assessment: Generalized weakness(limited movement in BLE, L>R) LLE Deficits / Details: dec ROM and also strength    Cervical / Trunk Assessment Cervical / Trunk Assessment: Kyphotic;Other exceptions  Communication   Communication: HOH;Other (comment)(confused)  Cognition Arousal/Alertness: Lethargic Behavior During Therapy: Restless;Flat affect Overall Cognitive Status: No family/caregiver present to determine baseline cognitive functioning                                 General Comments: oriented to self only; unaware she has a broken hip      General Comments      Exercises     Assessment/Plan    PT Assessment    PT Problem List         PT Treatment Interventions      PT Goals (Current goals can be found in the Care Plan section)  Acute Rehab PT Goals Patient Stated Goal: to rest and be herself for a while Time For Goal Achievement: 02/22/19 Potential to Achieve Goals: Fair    Frequency Min 2X/week   Barriers to discharge        Co-evaluation               AM-PAC PT "6 Clicks" Mobility  Outcome Measure Help needed turning from your back to your side while in a flat bed without using bedrails?: A Lot Help needed moving from lying on your back to sitting on the side of a flat bed without using bedrails?: Total Help needed moving to and from a bed to a chair (including a wheelchair)?: Total Help needed standing up from a chair using your arms (e.g., wheelchair or bedside chair)?: Total Help needed to walk in hospital room?: Total Help needed climbing 3-5 steps with a railing? : Total 6 Click Score: 7    End of Session Equipment Utilized During Treatment:  Gait belt Activity Tolerance: Treatment limited secondary to medical complications (Comment);Patient limited by fatigue;Patient limited by lethargy Patient left: in chair;with call bell/phone within reach;with chair alarm set Nurse Communication: Mobility status PT Visit Diagnosis: Other abnormalities of gait and mobility (R26.89);Muscle weakness (generalized) (M62.81);Pain Pain - Right/Left: Left Pain - part of body: Hip    Time: [id number] PT Time Calculation (min) (ACUTE ONLY): 37 min   Charges:   PT Evaluation $PT Eval Moderate Complexity: [place], PT   Delford Field 02/08/2019, 4:54 PM

## 2019-02-08 NOTE — Plan of Care (Signed)
Staples Removed from LF hip.  Incision closed, clean intacted.  No s/sx of infection,

## 2019-02-08 NOTE — TOC Initial Note (Signed)
Transition of Care [facility]) - Initial/Assessment Note    Patient Details  Name: Shelly Cervantes MRN: [id number] Date of Birth: [birth year]  [facility] CM/SW Contact:    Weston Anna, LCSW Phone Number: 02/08/2019, 9:45 AM  Clinical Narrative:                  CSW spoke with patients daughter, Karna Christmas, regarding discharge plans. Patient was recently admitted at [facility] and discharged to [facility] SNF- there she was tested for covid-19 and was positive. Facility has decided patient is not able to return at this time due to being positive for covid-19.   CSW and Karna Christmas discussed limited options that are accepting covid positive patients at this time- [facility] does not accept patients insurance ([employer]), [facility] will take insurance, [facility] still waiting to hear back.   Karna Christmas is interested in seeing if her mom could be retested for covid in order to have additional placement opportunities. CSW will talk with MD.    Expected Discharge Plan: [place] Barriers to Discharge: No SNF bed, Continued Medical Work up   Patient Goals and CMS Choice Patient states their goals for this hospitalization and ongoing recovery are:: getting to a safe SNF CMS Medicare.gov Compare Post Acute Care list provided to:: Patient Represenative (must comment)(not oriented- spoke with daughter terri) Choice offered to / list presented to : Adult Children  Expected Discharge Plan and Services Expected Discharge Plan: [place] In-house Referral: Clinical Social Work     Living arrangements for the past 2 months: [place]                 DME Arranged: N/A         HH Arranged: NA          Prior Living Arrangements/Services Living arrangements for the past 2 months: [place] Lives with:: Facility Resident Patient language and need for interpreter reviewed:: Yes Do you feel safe going back to the place where you  live?: Yes      Need for Family Participation in Patient Care: Yes (Comment) Care giver support system in place?: Yes (comment)      Activities of Daily Living Home Assistive Devices/Equipment: None ADL Screening (condition at time of admission) Patient's cognitive ability adequate to safely complete daily activities?: No Is the patient deaf or have difficulty hearing?: No Does the patient have difficulty seeing, even when wearing glasses/contacts?: No Does the patient have difficulty concentrating, remembering, or making decisions?: Yes Patient able to express need for assistance with ADLs?: Yes Does the patient have difficulty dressing or bathing?: Yes Independently performs ADLs?: No Communication: Independent Dressing (OT): Appropriate for developmental age Grooming: Needs assistance Is this a change from baseline?: Pre-admission baseline Feeding: Independent Bathing: Needs assistance Is this a change from baseline?: Pre-admission baseline Toileting: Needs assistance Is this a change from baseline?: Pre-admission baseline In/Out Bed: Needs assistance Is this a change from baseline?: Pre-admission baseline Walks in Home: Needs assistance Is this a change from baseline?: Pre-admission baseline Does the patient have difficulty walking or climbing stairs?: Yes Weakness of Legs: Both Weakness of Arms/Hands: Both  Permission Sought/Granted                  Emotional Assessment     Affect (typically observed): Accepting, Appropriate, Calm, Hopeful(spoke with daughter, terri) Orientation: : Oriented to Self   Psych Involvement: No (comment)  Admission diagnosis:  Syncope and collapse [  R55] Hypotension due to hypovolemia [I95.89, E86.1] COVID-19 [U07.1] Patient Active Problem List   Diagnosis Date Noted  . Pressure injury of skin 02/07/2019  . PNA (pneumonia) 02/06/2019  . COVID-19 02/06/2019  . Cerebral thrombosis with cerebral infarction 01/22/2019  . Closed  fracture of left hip ([place]) 01/18/2019  . Hypokalemia 01/18/2019  . Hypertensive urgency 01/18/2019  . Prolonged QT interval 01/18/2019  . Fall   . Late onset Alzheimer's disease without behavioral disturbance ([place]) 12/07/2018  . Osteopenia 12/08/2016  . Malignant neoplasm of upper-outer quadrant of left breast in female, estrogen receptor positive ([place]) 10/14/2016   PCP:  [name], MD Pharmacy:   [facility] - [place], [place] - [phone] [place] [address] Phone: [phone] Fax: [phone]     Social Determinants of Health (SDOH) Interventions    Readmission Risk Interventions No flowsheet data found.

## 2019-02-09 LAB — CBC WITH DIFFERENTIAL/PLATELET
Abs Immature Granulocytes: 0.03 10*3/uL (ref 0.00–0.07)
Basophils Absolute: 0 10*3/uL (ref 0.0–0.1)
Basophils Relative: 0 %
Eosinophils Absolute: 0 10*3/uL (ref 0.0–0.5)
Eosinophils Relative: 0 %
HCT: 30.1 % — ABNORMAL LOW (ref 36.0–46.0)
Hemoglobin: 9.8 g/dL — ABNORMAL LOW (ref 12.0–15.0)
Immature Granulocytes: 1 %
Lymphocytes Relative: 20 %
Lymphs Abs: 0.8 10*3/uL (ref 0.7–4.0)
MCH: 29.5 pg (ref 26.0–34.0)
MCHC: 32.6 g/dL (ref 30.0–36.0)
MCV: 90.7 fL (ref 80.0–100.0)
Monocytes Absolute: 0.2 10*3/uL (ref 0.1–1.0)
Monocytes Relative: 6 %
Neutro Abs: 3.1 10*3/uL (ref 1.7–7.7)
Neutrophils Relative %: 73 %
Platelets: 145 10*3/uL — ABNORMAL LOW (ref 150–400)
RBC: 3.32 MIL/uL — ABNORMAL LOW (ref 3.87–5.11)
RDW: 13.4 % (ref 11.5–15.5)
WBC: 4.2 10*3/uL (ref 4.0–10.5)
nRBC: 0 % (ref 0.0–0.2)

## 2019-02-09 LAB — COMPREHENSIVE METABOLIC PANEL
ALT: 21 U/L (ref 0–44)
AST: 25 U/L (ref 15–41)
Albumin: 3 g/dL — ABNORMAL LOW (ref 3.5–5.0)
Alkaline Phosphatase: 99 U/L (ref 38–126)
Anion gap: 10 (ref 5–15)
BUN: 5 mg/dL — ABNORMAL LOW (ref 8–23)
CO2: 21 mmol/L — ABNORMAL LOW (ref 22–32)
Calcium: 8.9 mg/dL (ref 8.9–10.3)
Chloride: 107 mmol/L (ref 98–111)
Creatinine, Ser: 0.69 mg/dL (ref 0.44–1.00)
GFR calc Af Amer: >60 mL/min (ref >60)
GFR calc non Af Amer: >60 mL/min (ref >60)
Glucose, Bld: 93 mg/dL (ref 70–99)
Potassium: 3.1 mmol/L — ABNORMAL LOW (ref 3.5–5.1)
Sodium: 138 mmol/L (ref 135–145)
Total Bilirubin: 0.5 mg/dL (ref 0.3–1.2)
Total Protein: 6.5 g/dL (ref 6.5–8.1)

## 2019-02-09 LAB — MAGNESIUM: Magnesium: 1.4 mg/dL — ABNORMAL LOW (ref 1.7–2.4)

## 2019-02-09 LAB — C-REACTIVE PROTEIN: CRP: 5.6 mg/dL — ABNORMAL HIGH (ref <1.0)

## 2019-02-09 LAB — PROCALCITONIN: Procalcitonin: <0.1 ng/mL

## 2019-02-09 LAB — D-DIMER, QUANTITATIVE: D-Dimer, Quant: 2.58 ug/mL-FEU — ABNORMAL HIGH (ref 0.00–0.50)

## 2019-02-09 MED ORDER — POTASSIUM CHLORIDE CRYS ER 20 MEQ PO TBCR
40.0000 meq | EXTENDED_RELEASE_TABLET | Freq: Once | ORAL | Status: AC
  Administered 2019-02-09: 40 meq via ORAL
  Filled 2019-02-09: qty 2

## 2019-02-09 MED ORDER — MAGNESIUM SULFATE 2 GM/50ML IV SOLN
2.0000 g | Freq: Once | INTRAVENOUS | Status: AC
  Administered 2019-02-09: 2 g via INTRAVENOUS
  Filled 2019-02-09: qty 50

## 2019-02-09 NOTE — Plan of Care (Signed)
Spoke with daughter to update and and discuss patient current status.  No further questions at this time.  Also assisted with facetime w mom.

## 2019-02-09 NOTE — Progress Notes (Addendum)
PROGRESS NOTE  HADYN Cervantes  [id number] DOB: July 30, 1935 DOA: 02/06/2019 PCP: [name], MD   Brief Narrative: Shelly Cervantes is an 83 y.o. female with a history of dementia, bipolar disorder, HTN, and recent admission for hip fracture who was sent from SNF due to vomiting, and reported hypoxia and hypotension. She was admitted 10/8 - 10/17 and discharged to SNF for rehabilitation following hip fracture. On evaluation in the ED she was not hypoxic with stable vital signs. CT head was performed showing no acute changes. CTA chest ruled out pulmonary embolism, but did show GGOs in posterior right lower lobe. In that clinical situation, aspiration was suspected though CRP was not elevated, procalcitonin was undetectable, CBC was normal and the patient had no respiratory symptoms. Lipase was modestly elevated though abdominal exam was benign. Gallbladder noted to be absent on CT chest and pancreas with no definite abnormalities. AST, ALT, and bilirubin were within normal limits. Ceftriaxone, azithromycin and IV fluids provided. Ultimately SARS-CoV-2 screen was positive, so patient admitted to [facility] though no further symptoms have developed.   Assessment & Plan: Active Problems:   PNA (pneumonia)   COVID-19   Pressure injury of skin  Pneumonia: Unclear etiology of relatively localized GGOs on CT. High risk for aspiration though blood work highly suggestive of no bacterial infection or significant inflammation from covid-19 at admission.  - Monitor off targeted Tx for now, received CTX, azithromycin in ED. Low threshold to restart abx if develops pulmonary sxs. - SLP evaluation showed no evidence of dysphagia.   Covid-19 infection: Possible cause of GI symptoms which have improved.  - CRP up again today, no pulmonary symptoms, fever or other nidusd of infection is evident. Recheck PCT pending. Will monitor again x24 hrs. - Reserve targeted Tx for now since no hypoxia or respiratory symptoms.   - Airborne/contact precautions - Cultures drawn on admission.   Elevated d-dimer: Commonly seen during covid, stable. LE U/S showed no evidence of DVT, CTA chest without PE.  - Trending downward, will continue anticoagulation.  AKI with lactic acidosis: Improving Cr 1.27 > 0.91 > 0.75 > 0.69.  - Hold ACEi - Continue IVF for 24 hrs.   Lipase elevation: With benign, nontender abdomen and ability to tolerate po intake, pancreatitis felt to be unlikely and lipase elevation which is improving more likely due to vomiting. Alkaline phosphatase normalized - Monitor clinically.   Pancytopenia: Suspect hemoconcentrated on admission. No bleeding reported. Possibly due to viral infection.  - Continue monitoring  Hypokalemia: Remains despite significant supplementation, evidence of poor per oral intake.  - Continue IVF w/supplementation. Stopped this morning for some reason, RN to restart. Will give supplemental magnesium and oral supplement as well. Continue monitoring.  Alzheimer's dementia, bipolar disorder:  - Continue home medications. Mood stable. - Delirium precautions - Monitor  Hip fracture:  - Outpatient follow up.  - Remove staples. - PT/OT to continue while admitted  Glaucoma:  - Continue gtts  HTN:  - Continue norvasc, hold lisinopril as above  Left facial droop: Not new, no acute findings on CT head  Thrombocytopenia: Mild, improved/stable. Likely covid effect.  Underweight:  - Dietitian consulted given poor per oral intake, need to heal wounds. Tube feeding not within goals of care. - Note on megace as outpatient, not continued while inpatient.  Pressure Injuries: Pressure Injury 02/07/19 Buttocks Left;Upper Stage II -  Partial thickness loss of dermis presenting as a shallow open ulcer with a red, pink wound bed without slough.  pink, skin broken but scabing  (Active)  02/07/19 1635  Location: Buttocks  Location Orientation: Left;Upper  Staging: Stage II -  Partial  thickness loss of dermis presenting as a shallow open ulcer with a red, pink wound bed without slough.  Wound Description (Comments): pink, skin broken but scabing   Present on Admission:      Pressure Injury 02/07/19 Buttocks Left;Lower Stage II -  Partial thickness loss of dermis presenting as a shallow open ulcer with a red, pink wound bed without slough. (Active)  02/07/19 1600  Location: Buttocks  Location Orientation: Left;Lower  Staging: Stage II -  Partial thickness loss of dermis presenting as a shallow open ulcer with a red, pink wound bed without slough.  Wound Description (Comments):   Present on Admission:      Pressure Injury 02/07/19 Buttocks Left Stage II -  Partial thickness loss of dermis presenting as a shallow open ulcer with a red, pink wound bed without slough. (Active)  02/07/19 1600  Location: Buttocks  Location Orientation: Left  Staging: Stage II -  Partial thickness loss of dermis presenting as a shallow open ulcer with a red, pink wound bed without slough.  Wound Description (Comments):   Present on Admission:     DVT prophylaxis: Lovenox Code Status: DNR Family Communication: None at bedside. Calling family daily.  Disposition Plan: SNF once improving. CRP rising, worrisome for progression of covid vs. other. Will monitor and need to delay DC at least 1 day.   Consultants:   None  Procedures:   None  Antimicrobials:  Ceftriaxone, azithromycin x1  Subjective: No new complaints. She's eating about 1 1/2 meals today per day.   Objective: Vitals:   02/08/19 1900 02/09/19 0400 02/09/19 0730 02/09/19 0849  BP: (!) 146/76 130/67    Pulse:      Resp: 20  17   Temp: 98.6 F (37 C) 98.3 F (36.8 C) 98 F (36.7 C)   TempSrc: Oral Oral Oral   SpO2:    98%  Weight:      Height:        Intake/Output Summary (Last 24 hours) at 02/09/2019 0905 Last data filed at 02/09/2019 0500 Gross per 24 hour  Intake 1058.58 ml  Output 500 ml  Net 558.58  ml   Filed Weights   02/06/19 1152  Weight: 45.4 kg   Gen: Frail elderly female in no distress Pulm: Nonlabored breathing room air. Clear. CV: Regular rate and rhythm. No murmur, rub, or gallop. No JVD, no dependent edema. GI: Abdomen soft, non-tender, non-distended, with normoactive bowel sounds.  Ext: Warm, no deformities Skin: No rashes, lesions or ulcers on visualized skin. Neuro: Alert, disoriented. No focal neurological deficits. Psych: Judgement and insight appear impaired. Mood euthymic & affect congruent. Behavior is appropriate.    Data Reviewed: I have personally reviewed following labs and imaging studies  CBC: Recent Labs  Lab 02/06/19 1036 02/07/19 0330 02/07/19 0500 02/08/19 0335 02/09/19 0017  WBC 6.0 THIS TEST WAS ORDERED IN ERROR AND HAS BEEN CREDITED. 3.6* 3.8* 4.2  NEUTROABS 4.2 THIS TEST WAS ORDERED IN ERROR AND HAS BEEN CREDITED. 2.1 2.7 3.1  HGB 12.2 THIS TEST WAS ORDERED IN ERROR AND HAS BEEN CREDITED. 9.3* 9.8* 9.8*  HCT 39.0 THIS TEST WAS ORDERED IN ERROR AND HAS BEEN CREDITED. 29.6* 30.9* 30.1*  MCV 93.1 THIS TEST WAS ORDERED IN ERROR AND HAS BEEN CREDITED. 93.4 92.2 90.7  PLT 230 THIS TEST WAS ORDERED IN ERROR AND  HAS BEEN CREDITED. 147* 150 [id number]*   Basic Metabolic Panel: Recent Labs  Lab 02/06/19 1036 02/07/19 0330 02/08/19 0335 02/09/19 0017  NA 134* 138 141 138  K 4.2 3.3* 3.1* 3.1*  CL 102 111 109 107  CO2 19* 18* 21* 21*  GLUCOSE 130* 72 106* 93  BUN 27* 23 12 5*  CREATININE 1.27* 0.91 0.75 0.69  CALCIUM 9.1 8.2* 8.8* 8.9  MG  --   --   --  1.4*   GFR: Estimated Creatinine Clearance: 39.5 mL/min (by C-G formula based on SCr of 0.69 mg/dL). Liver Function Tests: Recent Labs  Lab 02/06/19 1036 02/07/19 0330 02/08/19 0335 02/09/19 0017  AST 39 30 27 25   ALT 30 23 21 21   ALKPHOS 130* 97 99 99  BILITOT 0.1* 0.2* 0.3 0.5  PROT 6.9 5.7* 6.6 6.5  ALBUMIN 3.2* 2.7* 3.1* 3.0*   Recent Labs  Lab 02/06/19 1036 02/07/19 0330   LIPASE 166* 94*   No results for input(s): AMMONIA in the last 168 hours. Coagulation Profile: No results for input(s): INR, PROTIME in the last 168 hours. Cardiac Enzymes: No results for input(s): CKTOTAL, CKMB, CKMBINDEX, TROPONINI in the last 168 hours. BNP (last 3 results) No results for input(s): PROBNP in the last 8760 hours. HbA1C: No results for input(s): HGBA1C in the last 72 hours. CBG: Recent Labs  Lab 02/06/19 1150  GLUCAP 101*   Lipid Profile: No results for input(s): CHOL, HDL, LDLCALC, TRIG, CHOLHDL, LDLDIRECT in the last 72 hours. Thyroid Function Tests: No results for input(s): TSH, T4TOTAL, FREET4, T3FREE, THYROIDAB in the last 72 hours. Anemia Panel: Recent Labs    02/06/19 1758  FERRITIN 736*   Urine analysis: No results found for: COLORURINE, APPEARANCEUR, LABSPEC, PHURINE, GLUCOSEU, HGBUR, BILIRUBINUR, KETONESUR, PROTEINUR, UROBILINOGEN, NITRITE, LEUKOCYTESUR Recent Results (from the past 240 hour(s))  SARS CORONAVIRUS 2 (TAT 6-24 HRS) Nasopharyngeal Nasopharyngeal Swab     Status: Abnormal   Collection Time: 02/06/19 12:46 PM   Specimen: Nasopharyngeal Swab  Result Value Ref Range Status   SARS Coronavirus 2 POSITIVE (A) NEGATIVE Final    Comment: RESULT CALLED TO, READ BACK BY AND VERIFIED WITH: H VANKRETSCHMAR,RN 1723 02/06/2019 D BRADLEY (NOTE) SARS-CoV-2 target nucleic acids are DETECTED. The SARS-CoV-2 RNA is generally detectable in upper and lower respiratory specimens during the acute phase of infection. Positive results are indicative of active infection with SARS-CoV-2. Clinical  correlation with patient history and other diagnostic information is necessary to determine patient infection status. Positive results do  not rule out bacterial infection or co-infection with other viruses. The expected result is Negative. Fact Sheet for Patients: SugarRoll.be Fact Sheet for Healthcare Providers:  https://www.woods-mathews.com/ This test is not yet approved or cleared by the [place] FDA and  has been authorized for detection and/or diagnosis of SARS-CoV-2 by FDA under an Emergency Use Authorization (EUA). This EUA will remain  in effect (meaning this test can be [place] ed) for the duration of the COVID-19 declaration under Section 564(b)(1) of the Act, 21 U.S.C. section 360bbb-3(b)(1), unless the authorization is terminated or revoked sooner. Performed at [facility], [place] [address] [place], [place] [address]   Blood culture (routine x 2)     Status: None (Preliminary result)   Collection Time: 02/06/19  5:58 PM   Specimen: BLOOD  Result Value Ref Range Status   Specimen Description BLOOD LEFT ANTECUBITAL  Final   Special Requests   Final    BOTTLES DRAWN AEROBIC AND  ANAEROBIC Blood Culture adequate volume   Culture   Final    NO GROWTH 3 DAYS Performed at [facility], [place] [address], [place], [place] [address]    Report Status PENDING  Incomplete  Blood culture (routine x 2)     Status: None (Preliminary result)   Collection Time: 02/06/19  6:07 PM   Specimen: BLOOD LEFT FOREARM  Result Value Ref Range Status   Specimen Description BLOOD LEFT FOREARM  Final   Special Requests   Final    BOTTLES DRAWN AEROBIC ONLY Blood Culture results may not be optimal due to an inadequate volume of blood received in culture bottles   Culture   Final    NO GROWTH 3 DAYS Performed at [facility], [place] [address] [place],  [address]    Report Status PENDING  Incomplete      Radiology Studies: Vas [place] Lower Extremity Venous (dvt)  Result Date: 02/08/2019  Lower Venous Study Indications: Edema, and Covid Positive with elevated d-dimer.  Comparison Study: no priors. Performing Technologist: Oda Cogan RDMS, RVT  Examination Guidelines: A complete evaluation includes B-mode imaging, spectral Doppler, color Doppler, and power Doppler as needed  of all accessible portions of each vessel. Bilateral testing is considered an integral part of a complete examination. Limited examinations for reoccurring indications may be performed as noted.  +---------+---------------+---------+-----------+----------+--------------+ RIGHT    CompressibilityPhasicitySpontaneityPropertiesThrombus Aging +---------+---------------+---------+-----------+----------+--------------+ CFV      Full           Yes      Yes                                 +---------+---------------+---------+-----------+----------+--------------+ SFJ      Full                                                        +---------+---------------+---------+-----------+----------+--------------+ FV Prox  Full                                                        +---------+---------------+---------+-----------+----------+--------------+ FV Mid   Full                                                        +---------+---------------+---------+-----------+----------+--------------+ FV DistalFull                                                        +---------+---------------+---------+-----------+----------+--------------+ PFV      Full                                                        +---------+---------------+---------+-----------+----------+--------------+  POP      Full           Yes      Yes                                 +---------+---------------+---------+-----------+----------+--------------+ PTV      Full                                                        +---------+---------------+---------+-----------+----------+--------------+ PERO     Full                                                        +---------+---------------+---------+-----------+----------+--------------+   +---------+---------------+---------+-----------+----------+--------------+ LEFT     CompressibilityPhasicitySpontaneityPropertiesThrombus Aging  +---------+---------------+---------+-----------+----------+--------------+ CFV      Full           Yes      Yes                                 +---------+---------------+---------+-----------+----------+--------------+ SFJ      Full                                                        +---------+---------------+---------+-----------+----------+--------------+ FV Prox  Full                                                        +---------+---------------+---------+-----------+----------+--------------+ FV Mid   Full                                                        +---------+---------------+---------+-----------+----------+--------------+ FV DistalFull                                                        +---------+---------------+---------+-----------+----------+--------------+ PFV      Full                                                        +---------+---------------+---------+-----------+----------+--------------+ POP      Full           Yes      Yes                                 +---------+---------------+---------+-----------+----------+--------------+  PTV      Full                                                        +---------+---------------+---------+-----------+----------+--------------+ PERO     Full                                                        +---------+---------------+---------+-----------+----------+--------------+     Summary: Right: There is no evidence of deep vein thrombosis in the lower extremity. No cystic structure found in the popliteal fossa. Left: There is no evidence of deep vein thrombosis in the lower extremity. No cystic structure found in the popliteal fossa.  *See table(s) above for measurements and observations. Electronically signed by Servando Snare MD on 02/08/2019 at 4:29:00 PM.    Final     Scheduled Meds: . amLODipine  5 mg Oral Daily  . aspirin  81 mg Oral QHS  . buPROPion  150 mg  Oral Daily  . donepezil  5 mg Oral QHS  . enoxaparin  30 mg Subcutaneous Q24H  . feeding supplement (ENSURE ENLIVE)  237 mL Oral TID BM  . memantine  10 mg Oral Daily  . multivitamin with minerals  1 tablet Oral Daily  . pantoprazole  40 mg Oral Daily  . potassium chloride  40 mEq Oral Once  . sertraline  100 mg Oral Daily  . timolol  1 drop Both Eyes Daily  . traZODone  50 mg Oral QHS   Continuous Infusions: . magnesium sulfate bolus IVPB    . sodium chloride 0.45 % with kcl 75 mL/hr at 02/08/19 0837     LOS: 3 days   Time spent: 25 minutes.  Patrecia Pour, MD [facility] [email or web address] 02/09/2019, 9:05 AM

## 2019-02-09 NOTE — Care Management Important Message (Signed)
Important Message  Patient Details  Name: Shelly Cervantes MRN: [id number] Date of Birth: 1936/03/30   Medicare Important Message Given:  Yes - Important Message mailed due to current National Emergency  Verbal consent obtained due to current National Emergency  Relationship to patient: Child Contact Name: [name] Call Date: 02/09/19  Time: 17 Phone: [phone] Outcome: Spoke with contact Important Message mailed to: Other (must enter comment)(declined physical copy, but is aware copy is available if needed)    Delorse Lek 02/09/2019, 3:57 PM

## 2019-02-09 NOTE — TOC Progression Note (Signed)
Transition of Care [facility]) - Progression Note    Patient Details  Name: Shelly Cervantes MRN: [id number] Date of Birth: May 07, 1935  Transition of Care [facility]) CM/SW [place], [place] Phone Number: [phone] 02/09/2019, 2:46 PM  Clinical Narrative:     Update: Insurance auth received, starting on Nov 1.   CSW spoke with patient's daughter Cloyd Stagers regarding SNF choice, she reports being in agreement with [facility]. CSW informed her limited options due to patient's COVID positive status. She had several questions she asked CSW to follow up with [facility] regarding.   CSW called Gerald Stabs with [facility] who reports they will re test patient for COVID after a 21 day period, also reports she would be able to get PT, OT and speech at facility. Finally, he reports they do virtual visits and patients have large windows in their rooms for visits.   CSW called patient's daughter Karna Christmas back and updated, all questions answered.   Expected Discharge Plan: [place] Barriers to Discharge: No SNF bed, Continued Medical Work up  Expected Discharge Plan and Services Expected Discharge Plan: [place] In-house Referral: Clinical Social Work     Living arrangements for the past 2 months: [place]                 DME Arranged: N/A         HH Arranged: NA           Social Determinants of Health (SDOH) Interventions    Readmission Risk Interventions No flowsheet data found.

## 2019-02-10 LAB — D-DIMER, QUANTITATIVE: D-Dimer, Quant: 2.13 ug/mL-FEU — ABNORMAL HIGH (ref 0.00–0.50)

## 2019-02-10 LAB — COMPREHENSIVE METABOLIC PANEL
ALT: 20 U/L (ref 0–44)
AST: 24 U/L (ref 15–41)
Albumin: 2.9 g/dL — ABNORMAL LOW (ref 3.5–5.0)
Alkaline Phosphatase: 92 U/L (ref 38–126)
Anion gap: 9 (ref 5–15)
BUN: 7 mg/dL — ABNORMAL LOW (ref 8–23)
CO2: 21 mmol/L — ABNORMAL LOW (ref 22–32)
Calcium: 9 mg/dL (ref 8.9–10.3)
Chloride: 104 mmol/L (ref 98–111)
Creatinine, Ser: 0.67 mg/dL (ref 0.44–1.00)
GFR calc Af Amer: >60 mL/min (ref >60)
GFR calc non Af Amer: >60 mL/min (ref >60)
Glucose, Bld: 82 mg/dL (ref 70–99)
Potassium: 4.7 mmol/L (ref 3.5–5.1)
Sodium: 134 mmol/L — ABNORMAL LOW (ref 135–145)
Total Bilirubin: 0.4 mg/dL (ref 0.3–1.2)
Total Protein: 6.5 g/dL (ref 6.5–8.1)

## 2019-02-10 LAB — CBC WITH DIFFERENTIAL/PLATELET
Abs Immature Granulocytes: 0.03 10*3/uL (ref 0.00–0.07)
Basophils Absolute: 0 10*3/uL (ref 0.0–0.1)
Basophils Relative: 0 %
Eosinophils Absolute: 0 10*3/uL (ref 0.0–0.5)
Eosinophils Relative: 1 %
HCT: 29.2 % — ABNORMAL LOW (ref 36.0–46.0)
Hemoglobin: 9.5 g/dL — ABNORMAL LOW (ref 12.0–15.0)
Immature Granulocytes: 1 %
Lymphocytes Relative: 29 %
Lymphs Abs: 0.9 10*3/uL (ref 0.7–4.0)
MCH: 29.3 pg (ref 26.0–34.0)
MCHC: 32.5 g/dL (ref 30.0–36.0)
MCV: 90.1 fL (ref 80.0–100.0)
Monocytes Absolute: 0.2 10*3/uL (ref 0.1–1.0)
Monocytes Relative: 7 %
Neutro Abs: 2 10*3/uL (ref 1.7–7.7)
Neutrophils Relative %: 62 %
Platelets: 152 10*3/uL (ref 150–400)
RBC: 3.24 MIL/uL — ABNORMAL LOW (ref 3.87–5.11)
RDW: 13.4 % (ref 11.5–15.5)
WBC: 3.2 10*3/uL — ABNORMAL LOW (ref 4.0–10.5)
nRBC: 0 % (ref 0.0–0.2)

## 2019-02-10 LAB — C-REACTIVE PROTEIN: CRP: 5.2 mg/dL — ABNORMAL HIGH (ref <1.0)

## 2019-02-10 LAB — MAGNESIUM: Magnesium: 1.7 mg/dL (ref 1.7–2.4)

## 2019-02-10 NOTE — Progress Notes (Signed)
PROGRESS NOTE  Shelly Cervantes  [id number] DOB: [birth year] DOA: 02/06/2019 PCP: [name], MD   Brief Narrative: Shelly Cervantes is an 83 y.o. female with a history of dementia, bipolar disorder, HTN, and recent admission for hip fracture who was sent from SNF due to vomiting, and reported hypoxia and hypotension. She was admitted 10/8 - 10/17 and discharged to SNF for rehabilitation following hip fracture. On evaluation in the ED she was not hypoxic with stable vital signs. CT head was performed showing no acute changes. CTA chest ruled out pulmonary embolism, but did show GGOs in posterior right lower lobe. In that clinical situation, aspiration was suspected though CRP was not elevated, procalcitonin was undetectable, CBC was normal and the patient had no respiratory symptoms. Lipase was modestly elevated though abdominal exam was benign. Gallbladder noted to be absent on CT chest and pancreas with no definite abnormalities. AST, ALT, and bilirubin were within normal limits. Ceftriaxone, azithromycin and IV fluids provided. Ultimately SARS-CoV-2 screen was positive, so patient admitted to [facility] though no further symptoms have developed.   Assessment & Plan: Active Problems:   PNA (pneumonia)   COVID-19   Pressure injury of skin  Pneumonia: Unclear etiology of relatively localized GGOs on CT. High risk for aspiration though blood work highly suggestive of no bacterial infection or significant inflammation from covid-19 at admission.  - Monitor off targeted Tx for now, received CTX, azithromycin in ED. Low threshold to restart abx if develops pulmonary sxs. - SLP evaluation showed no evidence of dysphagia.   Covid-19 infection: Possible cause of GI symptoms which have improved.  - Conflicting evidence on labs in the setting of a very stable exam and no subjective complaints. CRP trended up significantly though PCT remains undetectable, no fever or leukocytosis (pending this AM) so no  antimicrobials have been started.  - Airborne/contact precautions - Cultures drawn on admission.   Elevated d-dimer: Commonly seen during covid, stable. LE U/S showed no evidence of DVT, CTA chest without PE.  - Trending downward, will continue lovenox 30mg  q24h ppx.  AKI with lactic acidosis: Improving Cr 1.27 > 0.91 > 0.75 > 0.69.  - Hold ACEi - Will stop IVF pending metabolic panel still pending since she's able to take po  Lipase elevation: With benign, nontender abdomen and ability to tolerate po intake, pancreatitis felt to be unlikely and lipase elevation which is improving more likely due to vomiting PTA. Alkaline phosphatase normalized - Monitor clinically.   Pancytopenia: Suspect hemoconcentrated on admission. No bleeding reported. Possibly due to viral infection, showing signs of improvement. - Continue monitoring  Hypokalemia:  - Continue monitoring.  Alzheimer's dementia, bipolar disorder:  - Continue home medications. Mood stable. - Delirium precautions  History of left hip fracture: Staples at operative site have been removed, wound healed nicely. - Outpatient follow up.  - PT/OT to continue while admitted  Glaucoma:  - Continue gtts  HTN:  - Continue norvasc, holding lisinopril as above as she remains normotensive  Left facial droop: Not new, no acute findings on CT head  Thrombocytopenia: Mild, improved/stable. Likely covid effect.  Underweight:  - Dietitian consulted given poor per oral intake, need to heal wounds. Tube feeding not within goals of care. - Note on megace as outpatient, not continued while inpatient.  Pressure Injuries: Pressure Injury 02/07/19 Buttocks Left;Upper Stage II -  Partial thickness loss of dermis presenting as a shallow open ulcer with a red, pink wound bed without slough. pink, skin broken  but scabing  (Active)  02/07/19 1635  Location: Buttocks  Location Orientation: Left;Upper  Staging: Stage II -  Partial thickness loss  of dermis presenting as a shallow open ulcer with a red, pink wound bed without slough.  Wound Description (Comments): pink, skin broken but scabing   Present on Admission:      Pressure Injury 02/07/19 Buttocks Left;Lower Stage II -  Partial thickness loss of dermis presenting as a shallow open ulcer with a red, pink wound bed without slough. (Active)  02/07/19 1600  Location: Buttocks  Location Orientation: Left;Lower  Staging: Stage II -  Partial thickness loss of dermis presenting as a shallow open ulcer with a red, pink wound bed without slough.  Wound Description (Comments):   Present on Admission:      Pressure Injury 02/07/19 Buttocks Left Stage II -  Partial thickness loss of dermis presenting as a shallow open ulcer with a red, pink wound bed without slough. (Active)  02/07/19 1600  Location: Buttocks  Location Orientation: Left  Staging: Stage II -  Partial thickness loss of dermis presenting as a shallow open ulcer with a red, pink wound bed without slough.  Wound Description (Comments):   Present on Admission:     DVT prophylaxis: Lovenox Code Status: DNR Family Communication: None at bedside. Calling family daily.  Disposition Plan: SNF as soon as 11/1 as that is when insurance authorization begins.    Consultants:   None  Procedures:   None  Antimicrobials:  Ceftriaxone, azithromycin x1  Subjective: Continues to have no complaints. Denies any trouble breathing or pain anywhere. With coaxing, she is able to eat without any issues.   Objective: Vitals:   02/09/19 1254 02/09/19 1727 02/10/19 0438 02/10/19 0723  BP: 135/72 (!) 142/74 131/67 130/74  Pulse: 79 75 64 67  Resp: 19 (!) 21 16 14   Temp: 98.2 F (36.8 C) 98.2 F (36.8 C) 98.2 F (36.8 C) 97.9 F (36.6 C)  TempSrc: Oral Oral Oral Oral  SpO2: 96% 98% 96% 99%  Weight:      Height:        Intake/Output Summary (Last 24 hours) at 02/10/2019 [id number] Last data filed at 02/10/2019 0720 Gross per 24  hour  Intake 2056.37 ml  Output 1350 ml  Net 706.37 ml   Filed Weights   02/06/19 1152  Weight: 45.4 kg   Gen: Frail elderly female in no distress Pulm: Nonlabored breathing room air. Clear. CV: Regular rate and rhythm. No murmur, rub, or gallop. No JVD, no dependent edema. GI: Abdomen soft, non-tender, non-distended, with normoactive bowel sounds.  Ext: Warm, no deformities, decreased muscle bulk. Skin: No rashes, lesions or ulcers on visualized skin. Neuro: Alert, pleasantly confused. No focal neurological deficits. Psych: Judgement and insight appear impaired. Mood euthymic & affect congruent. Behavior is appropriate.    Data Reviewed: I have personally reviewed following labs and imaging studies  CBC: Recent Labs  Lab 02/06/19 1036 02/07/19 0330 02/07/19 0500 02/08/19 0335 02/09/19 0017  WBC 6.0 THIS TEST WAS ORDERED IN ERROR AND HAS BEEN CREDITED. 3.6* 3.8* 4.2  NEUTROABS 4.2 THIS TEST WAS ORDERED IN ERROR AND HAS BEEN CREDITED. 2.1 2.7 3.1  HGB 12.2 THIS TEST WAS ORDERED IN ERROR AND HAS BEEN CREDITED. 9.3* 9.8* 9.8*  HCT 39.0 THIS TEST WAS ORDERED IN ERROR AND HAS BEEN CREDITED. 29.6* 30.9* 30.1*  MCV 93.1 THIS TEST WAS ORDERED IN ERROR AND HAS BEEN CREDITED. 93.4 92.2 90.7  PLT 230 THIS TEST  WAS ORDERED IN ERROR AND HAS BEEN CREDITED. 147* 150 [id number]*   Basic Metabolic Panel: Recent Labs  Lab 02/06/19 1036 02/07/19 0330 02/08/19 0335 02/09/19 0017  NA 134* 138 141 138  K 4.2 3.3* 3.1* 3.1*  CL 102 111 109 107  CO2 19* 18* 21* 21*  GLUCOSE 130* 72 106* 93  BUN 27* 23 12 5*  CREATININE 1.27* 0.91 0.75 0.69  CALCIUM 9.1 8.2* 8.8* 8.9  MG  --   --   --  1.4*   GFR: Estimated Creatinine Clearance: 39.5 mL/min (by C-G formula based on SCr of 0.69 mg/dL). Liver Function Tests: Recent Labs  Lab 02/06/19 1036 02/07/19 0330 02/08/19 0335 02/09/19 0017  AST 39 30 27 25   ALT 30 23 21 21   ALKPHOS 130* 97 99 99  BILITOT 0.1* 0.2* 0.3 0.5  PROT 6.9 5.7* 6.6 6.5   ALBUMIN 3.2* 2.7* 3.1* 3.0*   Recent Results (from the past 240 hour(s))  SARS CORONAVIRUS 2 (TAT 6-24 HRS) Nasopharyngeal Nasopharyngeal Swab     Status: Abnormal   Collection Time: 02/06/19 12:46 PM   Specimen: Nasopharyngeal Swab  Result Value Ref Range Status   SARS Coronavirus 2 POSITIVE (A) NEGATIVE Final    Comment: RESULT CALLED TO, READ BACK BY AND VERIFIED WITH: H VANKRETSCHMAR,RN 1723 02/06/2019 D BRADLEY (NOTE) SARS-CoV-2 target nucleic acids are DETECTED. The SARS-CoV-2 RNA is generally detectable in upper and lower respiratory specimens during the acute phase of infection. Positive results are indicative of active infection with SARS-CoV-2. Clinical  correlation with patient history and other diagnostic information is necessary to determine patient infection status. Positive results do  not rule out bacterial infection or co-infection with other viruses. The expected result is Negative. Fact Sheet for Patients: SugarRoll.be Fact Sheet for Healthcare Providers: https://www.woods-mathews.com/ This test is not yet approved or cleared by the [place] FDA and  has been authorized for detection and/or diagnosis of SARS-CoV-2 by FDA under an Emergency Use Authorization (EUA). This EUA will remain  in effect (meaning this test can be [place] ed) for the duration of the COVID-19 declaration under Section 564(b)(1) of the Act, 21 U.S.C. section 360bbb-3(b)(1), unless the authorization is terminated or revoked sooner. Performed at [facility], [place] [address] [place], [place] [address]   Blood culture (routine x 2)     Status: None (Preliminary result)   Collection Time: 02/06/19  5:58 PM   Specimen: BLOOD  Result Value Ref Range Status   Specimen Description BLOOD LEFT ANTECUBITAL  Final   Special Requests   Final    BOTTLES DRAWN AEROBIC AND ANAEROBIC Blood Culture adequate volume   Culture   Final    NO GROWTH 4 DAYS  Performed at [facility], [place] [address], [place], [place] [address]    Report Status PENDING  Incomplete  Blood culture (routine x 2)     Status: None (Preliminary result)   Collection Time: 02/06/19  6:07 PM   Specimen: BLOOD LEFT FOREARM  Result Value Ref Range Status   Specimen Description BLOOD LEFT FOREARM  Final   Special Requests   Final    BOTTLES DRAWN AEROBIC ONLY Blood Culture results may not be optimal due to an inadequate volume of blood received in culture bottles   Culture   Final    NO GROWTH 4 DAYS Performed at [facility], [place] [address], [place], [place] [address]    Report Status PENDING  Incomplete  Radiology Studies: No results found.  Scheduled Meds: . amLODipine  5 mg Oral Daily  . aspirin  81 mg Oral QHS  . buPROPion  150 mg Oral Daily  . donepezil  5 mg Oral QHS  . enoxaparin  30 mg Subcutaneous Q24H  . feeding supplement (ENSURE ENLIVE)  237 mL Oral TID BM  . memantine  10 mg Oral Daily  . multivitamin with minerals  1 tablet Oral Daily  . pantoprazole  40 mg Oral Daily  . sertraline  100 mg Oral Daily  . timolol  1 drop Both Eyes Daily  . traZODone  50 mg Oral QHS   Continuous Infusions: . sodium chloride 0.45 % with kcl 75 mL/hr at 02/10/19 0017     LOS: 4 days   Time spent: 25 minutes.  Patrecia Pour, MD [facility] [email or web address] 02/10/2019, 9:17 AM

## 2019-02-10 NOTE — Progress Notes (Signed)
   02/10/19 1251  Family/Significant Other Communication  Family/Significant Other Update Called;Updated;Other (Comment) Karna Christmas, daughter.  Requests update from MD as well today. )

## 2019-02-11 DIAGNOSIS — R197 Diarrhea, unspecified: Secondary | ICD-10-CM | POA: Diagnosis not present

## 2019-02-11 DIAGNOSIS — E876 Hypokalemia: Secondary | ICD-10-CM | POA: Diagnosis not present

## 2019-02-11 DIAGNOSIS — R41841 Cognitive communication deficit: Secondary | ICD-10-CM | POA: Diagnosis not present

## 2019-02-11 DIAGNOSIS — S72009D Fracture of unspecified part of neck of unspecified femur, subsequent encounter for closed fracture with routine healing: Secondary | ICD-10-CM | POA: Diagnosis not present

## 2019-02-11 DIAGNOSIS — D61818 Other pancytopenia: Secondary | ICD-10-CM | POA: Diagnosis not present

## 2019-02-11 DIAGNOSIS — R262 Difficulty in walking, not elsewhere classified: Secondary | ICD-10-CM | POA: Diagnosis not present

## 2019-02-11 DIAGNOSIS — R5381 Other malaise: Secondary | ICD-10-CM | POA: Diagnosis not present

## 2019-02-11 DIAGNOSIS — M2550 Pain in unspecified joint: Secondary | ICD-10-CM | POA: Diagnosis not present

## 2019-02-11 DIAGNOSIS — D696 Thrombocytopenia, unspecified: Secondary | ICD-10-CM | POA: Diagnosis not present

## 2019-02-11 DIAGNOSIS — Z20828 Contact with and (suspected) exposure to other viral communicable diseases: Secondary | ICD-10-CM | POA: Diagnosis not present

## 2019-02-11 DIAGNOSIS — U071 COVID-19: Secondary | ICD-10-CM | POA: Diagnosis not present

## 2019-02-11 DIAGNOSIS — R6889 Other general symptoms and signs: Secondary | ICD-10-CM | POA: Diagnosis not present

## 2019-02-11 DIAGNOSIS — Z7401 Bed confinement status: Secondary | ICD-10-CM | POA: Diagnosis not present

## 2019-02-11 DIAGNOSIS — N179 Acute kidney failure, unspecified: Secondary | ICD-10-CM | POA: Diagnosis not present

## 2019-02-11 DIAGNOSIS — R69 Illness, unspecified: Secondary | ICD-10-CM | POA: Diagnosis not present

## 2019-02-11 DIAGNOSIS — G309 Alzheimer's disease, unspecified: Secondary | ICD-10-CM | POA: Diagnosis not present

## 2019-02-11 DIAGNOSIS — R1312 Dysphagia, oropharyngeal phase: Secondary | ICD-10-CM | POA: Diagnosis not present

## 2019-02-11 DIAGNOSIS — S72042G Displaced fracture of base of neck of left femur, subsequent encounter for closed fracture with delayed healing: Secondary | ICD-10-CM | POA: Diagnosis not present

## 2019-02-11 DIAGNOSIS — A0472 Enterocolitis due to Clostridium difficile, not specified as recurrent: Secondary | ICD-10-CM | POA: Diagnosis not present

## 2019-02-11 LAB — COMPREHENSIVE METABOLIC PANEL
ALT: 20 U/L (ref 0–44)
AST: 22 U/L (ref 15–41)
Albumin: 3 g/dL — ABNORMAL LOW (ref 3.5–5.0)
Alkaline Phosphatase: 91 U/L (ref 38–126)
Anion gap: 9 (ref 5–15)
BUN: 12 mg/dL (ref 8–23)
CO2: 23 mmol/L (ref 22–32)
Calcium: 9.2 mg/dL (ref 8.9–10.3)
Chloride: 101 mmol/L (ref 98–111)
Creatinine, Ser: 0.71 mg/dL (ref 0.44–1.00)
GFR calc Af Amer: >60 mL/min (ref >60)
GFR calc non Af Amer: >60 mL/min (ref >60)
Glucose, Bld: 88 mg/dL (ref 70–99)
Potassium: 4 mmol/L (ref 3.5–5.1)
Sodium: 133 mmol/L — ABNORMAL LOW (ref 135–145)
Total Bilirubin: 0.6 mg/dL (ref 0.3–1.2)
Total Protein: 6.5 g/dL (ref 6.5–8.1)

## 2019-02-11 LAB — CBC WITH DIFFERENTIAL/PLATELET
Abs Immature Granulocytes: 0.04 10*3/uL (ref 0.00–0.07)
Basophils Absolute: 0 10*3/uL (ref 0.0–0.1)
Basophils Relative: 0 %
Eosinophils Absolute: 0.1 10*3/uL (ref 0.0–0.5)
Eosinophils Relative: 2 %
HCT: 31 % — ABNORMAL LOW (ref 36.0–46.0)
Hemoglobin: 10 g/dL — ABNORMAL LOW (ref 12.0–15.0)
Immature Granulocytes: 1 %
Lymphocytes Relative: 28 %
Lymphs Abs: 1 10*3/uL (ref 0.7–4.0)
MCH: 29 pg (ref 26.0–34.0)
MCHC: 32.3 g/dL (ref 30.0–36.0)
MCV: 89.9 fL (ref 80.0–100.0)
Monocytes Absolute: 0.3 10*3/uL (ref 0.1–1.0)
Monocytes Relative: 8 %
Neutro Abs: 2.2 10*3/uL (ref 1.7–7.7)
Neutrophils Relative %: 61 %
Platelets: 169 10*3/uL (ref 150–400)
RBC: 3.45 MIL/uL — ABNORMAL LOW (ref 3.87–5.11)
RDW: 13.3 % (ref 11.5–15.5)
WBC: 3.5 10*3/uL — ABNORMAL LOW (ref 4.0–10.5)
nRBC: 0 % (ref 0.0–0.2)

## 2019-02-11 LAB — CULTURE, BLOOD (ROUTINE X 2)
Culture: NO GROWTH
Culture: NO GROWTH
Special Requests: ADEQUATE

## 2019-02-11 LAB — D-DIMER, QUANTITATIVE: D-Dimer, Quant: 1.79 ug/mL-FEU — ABNORMAL HIGH (ref 0.00–0.50)

## 2019-02-11 LAB — C-REACTIVE PROTEIN: CRP: 2.8 mg/dL — ABNORMAL HIGH (ref <1.0)

## 2019-02-11 NOTE — TOC Transition Note (Signed)
Transition of Care [facility]) - CM/SW Discharge Note   Patient Details  Name: Shelly Cervantes MRN: [id number] Date of Birth: 04-02-1936  [facility] CM/SW Contact:  Eileen Stanford, LCSW Phone Number: 02/11/2019, 10:55 AM   Clinical Narrative:    Clinical Social Worker facilitated patient discharge including contacting patient family and facility to confirm patient discharge plans.  Clinical information faxed to facility and family agreeable with plan.  CSW arranged ambulance transport via [facility] to [facility] in [place].  RN to call [phone] for report prior to discharge.    Final next level of care: Skilled Nursing Facility Barriers to Discharge: No Barriers Identified   Patient Goals and CMS Choice Patient states their goals for this hospitalization and ongoing recovery are:: getting to a safe SNF CMS Medicare.gov Compare Post Acute Care list provided to:: Patient Represenative (must comment)(not oriented- spoke with daughter terri) Choice offered to / list presented to : Adult Children  Discharge Placement              Patient chooses bed at: [facility] Patient to be transferred to facility by: [place] Name of family member notified: Mabeline Caras, daughter Patient and family notified of of transfer: 02/11/19  Discharge Plan and Services In-house Referral: Clinical Social Work              DME Arranged: N/A         HH Arranged: NA          Social Determinants of Health (SDOH) Interventions     Readmission Risk Interventions No flowsheet data found.

## 2019-02-11 NOTE — Discharge Summary (Signed)
Physician Discharge Summary  Shelly Cervantes [id number] DOB: 02-24-1936 DOA: 02/06/2019  PCP: [name], MD  Admit date: 02/06/2019 Discharge date: 02/11/2019  Admitted From: Home Disposition: SNF   Recommendations for Outpatient Follow-up:  1. Follow up with PCP in 1-2 weeks 2. Please obtain CMP/CBC in one week 3. Continue wound care as below.  Home Health: N/A Equipment/Devices: Per SNF Discharge Condition: Stable CODE STATUS: DNR Diet recommendation: As tolerated  Brief/Interim Summary: Shelly Cervantes is an 83 y.o. female with a history of dementia, bipolar disorder, HTN, and recent admission for hip fracture who was sent from SNF due to vomiting, and reported hypoxia and hypotension. She was admitted 10/8 - 10/17 and discharged to SNF for rehabilitation following hip fracture. On evaluation in the ED she was not hypoxic with stable vital signs. CT head was performed showing no acute changes. CTA chest ruled out pulmonary embolism, but did show GGOs in posterior right lower lobe. In that clinical situation, aspiration was suspected though CRP was not elevated, procalcitonin was undetectable, CBC was normal and the patient had no respiratory symptoms. Lipase was modestly elevated though abdominal exam was benign. Gallbladder noted to be absent on CT chest and pancreas with no definite abnormalities. AST, ALT, and bilirubin were within normal limits. Ceftriaxone, azithromycin and IV fluids provided. Ultimately SARS-CoV-2 screen was positive, so patient admitted to [facility] though no further symptoms have developed. Antibiotics stopped and the patient has remained without severe symptoms, though is weak and will require SNF level of care at discharge.   Discharge Diagnoses:  Active Problems:   PNA (pneumonia)   COVID-19   Pressure injury of skin  Pneumonia: Unclear etiology of relatively localized GGOs on CT. High risk for aspiration though blood work highly suggestive of no  bacterial infection or significant inflammation from covid-19 at admission.  - Monitor off targeted Tx - SLP evaluation showed no evidence of dysphagia.   Covid-19 infection: Possible cause of GI symptoms which have improved. Inflammatory markers dide rise from 10/29 - 10/31 with unclear significance. Pt without developing any symptoms, and without intervention CRP has improved. PCT remains negative.  - Conflicting evidence on labs in the setting of a very stable exam and no subjective complaints. CRP trended up significantly though PCT remains undetectable, no fever or leukocytosis so no antimicrobials have been started.  - Airborne/contact precautions - Cultures drawn on admission, NGTD.   Elevated d-dimer: Commonly seen during covid, stable. LE U/S showed no evidence of DVT, CTA chest without PE.  - Trending downward   AKI with lactic acidosis: Improving Cr 1.27 >> 0.71 - continue home medications.   Lipase elevation: With benign, nontender abdomen and ability to tolerate po intake, pancreatitis felt to be unlikely and lipase elevation which is improving more likely due to vomiting PTA. No other LFT abnormalities - Monitor clinically.   Pancytopenia: No bleeding reported. Possibly due to viral infection, showing signs of improvement. - Continue monitoring  Hypokalemia:  - Continue monitoring.  Alzheimer's dementia, bipolar disorder:  - Continue home medications. Mood stable. - Delirium precautions  History of left hip fracture: Staples at operative site have been removed, wound healed nicely. - Outpatient follow up.  - PT/OT to continue at SNF  Glaucoma:  - Continue gtts  HTN:  - Continue norvasc and restart home lisinopril at PTA dosing  Left facial droop: Not new, no acute findings on CT head  Thrombocytopenia: Mild, improved/stable. Likely covid effect. Resolved.  Underweight:  - Dietitian  consulted given poor per oral intake, need to heal wounds. Tube  feeding not within goals of care. - Note on megace   Pressure Injuries: These are presumed to be present on arrival. Pressure Injury 02/07/19 Buttocks Left;Upper Stage II -  Partial thickness loss of dermis presenting as a shallow open ulcer with a red, pink wound bed without slough. pink, skin broken but scabing  (Active)  02/07/19 1635  Location: Buttocks  Location Orientation: Left;Upper  Staging: Stage II -  Partial thickness loss of dermis presenting as a shallow open ulcer with a red, pink wound bed without slough.  Wound Description (Comments): pink, skin broken but scabing   Present on Admission:      Pressure Injury 02/07/19 Buttocks Left;Lower Stage II -  Partial thickness loss of dermis presenting as a shallow open ulcer with a red, pink wound bed without slough. (Active)  02/07/19 1600  Location: Buttocks  Location Orientation: Left;Lower  Staging: Stage II -  Partial thickness loss of dermis presenting as a shallow open ulcer with a red, pink wound bed without slough.  Wound Description (Comments):   Present on Admission:      Pressure Injury 02/07/19 Buttocks Left Stage II -  Partial thickness loss of dermis presenting as a shallow open ulcer with a red, pink wound bed without slough. (Active)  02/07/19 1600  Location: Buttocks  Location Orientation: Left  Staging: Stage II -  Partial thickness loss of dermis presenting as a shallow open ulcer with a red, pink wound bed without slough.  Wound Description (Comments):   Present on Admission:      Discharge Instructions  Allergies as of 02/11/2019      Reactions   Codeine Rash   "Allergic," per MAR   Penicillins Rash   Did it involve swelling of the face/tongue/throat, SOB, or low BP? No. Bur "Allergic," per [facility] Did it involve sudden or severe rash/hives, skin peeling, or any reaction on the inside of your mouth or nose? No Did you need to seek medical attention at a hospital or doctor's office? No When did it last  happen?Many years ago If all above answers are "NO", may proceed with cephalosporin use. ( has tolerated Ancef )      Medication List    STOP taking these medications   enoxaparin 30 MG/0.3ML injection Commonly known as: LOVENOX     TAKE these medications   amLODipine 5 MG tablet Commonly known as: NORVASC Take 1 tablet (5 mg total) by mouth daily.   aspirin 81 MG chewable tablet Chew 81 mg by mouth at bedtime.   buPROPion 150 MG 24 hr tablet Commonly known as: WELLBUTRIN XL Take 150 mg by mouth daily.   docusate sodium 100 MG capsule Commonly known as: COLACE Take 1 capsule (100 mg total) by mouth 2 (two) times daily.   donepezil 5 MG tablet Commonly known as: ARICEPT Take 5 mg by mouth at bedtime.   feeding supplement (ENSURE ENLIVE) Liqd Take 237 mLs by mouth 2 (two) times daily between meals.   fluticasone 50 MCG/ACT nasal spray Commonly known as: FLONASE Place 1 spray into both nostrils 2 (two) times daily.   hydrOXYzine 25 MG tablet Commonly known as: ATARAX/VISTARIL Take 25 mg by mouth every 8 (eight) hours as needed (for anxiety or agitation).   lisinopril 10 MG tablet Commonly known as: ZESTRIL Take 10 mg by mouth 2 (two) times daily.   megestrol 20 MG tablet Commonly known as: MEGACE Take 20  mg by mouth daily.   Melatonin 5 MG Tabs Take 5 mg by mouth at bedtime.   memantine 10 MG tablet Commonly known as: NAMENDA Take 10 mg by mouth daily.   MULTI-VITAMIN DAILY PO Take 1 tablet by mouth daily.   omeprazole 20 MG capsule Commonly known as: PRILOSEC Take 20 mg by mouth daily as needed (heartburn or indigestion).   sertraline 100 MG tablet Commonly known as: ZOLOFT Take 100 mg by mouth daily.   timolol 0.5 % ophthalmic solution Commonly known as: TIMOPTIC Place 1 drop into both eyes daily.   traZODone 50 MG tablet Commonly known as: DESYREL Take 50 mg by mouth at bedtime.   Vitamin D3 50 MCG (2000 UT) Tabs Take 2,000 Units by  mouth daily.      Contact information for after-discharge care    [facility] .   Service: Skilled Nursing Contact information: [address] [address] [phone]             Allergies  Allergen Reactions  . Codeine Rash    "Allergic," per MAR  . Penicillins Rash    Did it involve swelling of the face/tongue/throat, SOB, or low BP? No. Bur "Allergic," per [facility] Did it involve sudden or severe rash/hives, skin peeling, or any reaction on the inside of your mouth or nose? No Did you need to seek medical attention at a hospital or doctor's office? No When did it last happen?Many years ago If all above answers are "NO", may proceed with cephalosporin use. ( has tolerated Ancef )     Consultations:  None  Procedures/Studies: Ct Angio Head W Or Wo Contrast  Result Date: 01/21/2019 CLINICAL DATA:  Left-sided weakness and facial droop. EXAM: CT ANGIOGRAPHY HEAD AND NECK CT PERFUSION BRAIN TECHNIQUE: Multidetector CT imaging of the head and neck was performed using the standard protocol during bolus administration of intravenous contrast. Multiplanar CT image reconstructions and MIPs were obtained to evaluate the vascular anatomy. Carotid stenosis measurements (when applicable) are obtained utilizing NASCET criteria, using the distal internal carotid diameter as the denominator. Multiphase CT imaging of the brain was performed following IV bolus contrast injection. Subsequent parametric perfusion maps were calculated using RAPID software. CONTRAST:  123mL OMNIPAQUE IOHEXOL 350 MG/ML SOLN COMPARISON:  None. FINDINGS: CTA NECK FINDINGS Aortic arch: Standard 3 vessel aortic arch with mild atherosclerotic plaque. Calcified plaque at both subclavian artery origins without significant stenosis. Right carotid system: Patent with small volume calcified plaque at the carotid bifurcation. No evidence of significant stenosis or  dissection. Tortuous distal cervical ICA. Left carotid system: Patent with small volume calcified plaque at the carotid bifurcation. No evidence of significant stenosis or dissection. Vertebral arteries: Patent and codominant without evidence of stenosis or dissection. Skeleton: Moderately advanced multilevel cervical facet arthrosis with trace multilevel anterolisthesis. Other neck: No evidence of cervical lymphadenopathy or mass. Upper chest: Minimal scarring in the lung apices. Review of the MIP images confirms the above findings CTA HEAD FINDINGS Anterior circulation: The internal carotid arteries are patent from skull base to carotid termini with mild atherosclerotic plaque bilaterally not resulting in significant stenosis. ACAs and MCAs are patent without evidence of proximal branch occlusion or significant proximal stenosis. No aneurysm is identified. Posterior circulation: The intracranial vertebral arteries are patent to the basilar with minimal nonstenotic plaque bilaterally. A patent right PICA and bilateral SCAis are visualized. AICAs and a left PICA are not clearly identified. The  basilar artery is widely patent with a small fenestration incidentally noted proximally. There is slight fusiform enlargement of the distal basilar artery at the PCA and SCA origins without a saccular aneurysm. Posterior communicating arteries are diminutive or absent. Both PCAs are patent with branch vessel irregularity bilaterally. Additionally, there are severe right PCA stenoses involving the P2 and proximal P3 segments, and there is a mild proximal left P2 stenosis. No aneurysm is identified. Venous sinuses: Patent. Anatomic variants: None. Review of the MIP images confirms the above findings CT Brain Perfusion Findings: ASPECTS: 10 CBF (<30%) Volume: 68mL Perfusion (Tmax>6.0s) volume: 60mL Mismatch Volume: n/a Infarction Location: n/a IMPRESSION: 1. No emergent large vessel occlusion. 2. Intracranial atherosclerosis  including severe right P2 and proximal P3 stenoses. 3. Mild cervical carotid artery atherosclerosis without stenosis. 4. Widely patent vertebral arteries. 5. Negative CTP. 6.  Aortic Atherosclerosis (ICD10-I70.0). These results were communicated to Dr. Rory Percy at 9:55 am on 01/21/2019 by text page via the [facility] messaging system. Electronically Signed   By: Logan Bores M.D.   On: 01/21/2019 10:20   Dg Chest 1 View  Result Date: 01/18/2019 CLINICAL DATA:  Fall, left leg pain EXAM: CHEST  1 VIEW COMPARISON:  07/11/2006 FINDINGS: The heart size and mediastinal contours are within normal limits. Both lungs are clear. The visualized skeletal structures are unremarkable. IMPRESSION: No acute cardiopulmonary findings. Electronically Signed   By: Davina Poke M.D.   On: 01/18/2019 16:39   Dg Pelvis 1-2 Views  Result Date: 01/18/2019 CLINICAL DATA:  Fall.  Left leg pain. EXAM: PELVIS - 1-2 VIEW COMPARISON:  Pelvic and right hip x-rays dated December 25, 2018. FINDINGS: Acute largely nondisplaced fracture of the left intertrochanteric femur. More subacute appearing nondisplaced fractures of the right superior and inferior pubic rami, new since the prior study. No dislocation. The hip joint spaces are relatively preserved. Osteopenia. Soft tissues are unremarkable. IMPRESSION: 1. Acute nondisplaced left intertrochanteric femur fracture. 2. More subacute appearing nondisplaced fractures of the right superior and inferior pubic rami, new since the prior study. Electronically Signed   By: Titus Dubin M.D.   On: 01/18/2019 16:42   Ct Head Wo Contrast  Result Date: 02/06/2019 CLINICAL DATA:  Syncopal episode EXAM: CT HEAD WITHOUT CONTRAST TECHNIQUE: Contiguous axial images were obtained from the base of the skull through the vertex without intravenous contrast. COMPARISON:  January 21, 2019 FINDINGS: Brain: There is stable disproportionate enlargement of the ventricles. Patchy and confluent areas of  hypoattenuation in the supratentorial white matter are nonspecific but likely reflect stable chronic microvascular ischemic changes. There is no acute intracranial hemorrhage, mass-effect, or edema. Gray-white differentiation is preserved. There is no extra-axial fluid collection. Vascular: No hyperdense vessel or unexpected calcification.There is atherosclerotic calcification at the skull base. Skull: Calvarium is unremarkable. Sinuses/Orbits: No acute finding. Other: None. IMPRESSION: No acute intracranial hemorrhage, mass effect, or evidence of acute infarction. Stable chronic findings as detailed above. Electronically Signed   By: Macy Mis M.D.   On: 02/06/2019 11:47   Ct Head Wo Contrast  Result Date: 01/18/2019 CLINICAL DATA:  Fall. EXAM: CT HEAD WITHOUT CONTRAST CT CERVICAL SPINE WITHOUT CONTRAST TECHNIQUE: Multidetector CT imaging of the head and cervical spine was performed following the standard protocol without intravenous contrast. Multiplanar CT image reconstructions of the cervical spine were also generated. COMPARISON:  CT paranasal sinuses dated December 20, 2017. FINDINGS: CT HEAD FINDINGS Brain: No evidence of acute infarction, hemorrhage, extra-axial collection or mass lesion/mass effect. Mild generalized cerebral atrophy.  Scattered moderate periventricular and subcortical white matter hypodensities are nonspecific, but favored to reflect chronic microvascular ischemic changes. Prominent lateral ventricles, greater than expected based on the degree of atrophy. Vascular: Calcified atherosclerosis at the skullbase. No hyperdense vessel. Skull: Normal. Negative for fracture or focal lesion. Sinuses/Orbits: No acute finding. Other: None. CT CERVICAL SPINE FINDINGS Alignment: No traumatic malalignment. Trace stepwise anterolisthesis from C3-C4 through C7-T1. Skull base and vertebrae: No acute fracture. No primary bone lesion or focal pathologic process. Soft tissues and spinal canal: No  prevertebral fluid or swelling. No visible canal hematoma. Disc levels: Mild disc height loss at C5-C6. Moderate facet arthropathy throughout the cervical spine. Upper chest: Negative. Other: None. IMPRESSION: CT head: 1.  No acute intracranial abnormality. 2. Mild atrophy and chronic microvascular ischemic changes. 3. Prominent lateral ventricles, greater than expected based on the degree of atrophy. Correlate for normal pressure hydrocephalus. CT cervical spine: 1.  No acute cervical spine fracture. Electronically Signed   By: Titus Dubin M.D.   On: 01/18/2019 17:22   Ct Angio Neck W Or Wo Contrast  Result Date: 01/21/2019 CLINICAL DATA:  Left-sided weakness and facial droop. EXAM: CT ANGIOGRAPHY HEAD AND NECK CT PERFUSION BRAIN TECHNIQUE: Multidetector CT imaging of the head and neck was performed using the standard protocol during bolus administration of intravenous contrast. Multiplanar CT image reconstructions and MIPs were obtained to evaluate the vascular anatomy. Carotid stenosis measurements (when applicable) are obtained utilizing NASCET criteria, using the distal internal carotid diameter as the denominator. Multiphase CT imaging of the brain was performed following IV bolus contrast injection. Subsequent parametric perfusion maps were calculated using RAPID software. CONTRAST:  147mL OMNIPAQUE IOHEXOL 350 MG/ML SOLN COMPARISON:  None. FINDINGS: CTA NECK FINDINGS Aortic arch: Standard 3 vessel aortic arch with mild atherosclerotic plaque. Calcified plaque at both subclavian artery origins without significant stenosis. Right carotid system: Patent with small volume calcified plaque at the carotid bifurcation. No evidence of significant stenosis or dissection. Tortuous distal cervical ICA. Left carotid system: Patent with small volume calcified plaque at the carotid bifurcation. No evidence of significant stenosis or dissection. Vertebral arteries: Patent and codominant without evidence of  stenosis or dissection. Skeleton: Moderately advanced multilevel cervical facet arthrosis with trace multilevel anterolisthesis. Other neck: No evidence of cervical lymphadenopathy or mass. Upper chest: Minimal scarring in the lung apices. Review of the MIP images confirms the above findings CTA HEAD FINDINGS Anterior circulation: The internal carotid arteries are patent from skull base to carotid termini with mild atherosclerotic plaque bilaterally not resulting in significant stenosis. ACAs and MCAs are patent without evidence of proximal branch occlusion or significant proximal stenosis. No aneurysm is identified. Posterior circulation: The intracranial vertebral arteries are patent to the basilar with minimal nonstenotic plaque bilaterally. A patent right PICA and bilateral SCAis are visualized. AICAs and a left PICA are not clearly identified. The basilar artery is widely patent with a small fenestration incidentally noted proximally. There is slight fusiform enlargement of the distal basilar artery at the PCA and SCA origins without a saccular aneurysm. Posterior communicating arteries are diminutive or absent. Both PCAs are patent with branch vessel irregularity bilaterally. Additionally, there are severe right PCA stenoses involving the P2 and proximal P3 segments, and there is a mild proximal left P2 stenosis. No aneurysm is identified. Venous sinuses: Patent. Anatomic variants: None. Review of the MIP images confirms the above findings CT Brain Perfusion Findings: ASPECTS: 10 CBF (<30%) Volume: 23mL Perfusion (Tmax>6.0s) volume: 31mL Mismatch Volume: n/a Infarction  Location: n/a IMPRESSION: 1. No emergent large vessel occlusion. 2. Intracranial atherosclerosis including severe right P2 and proximal P3 stenoses. 3. Mild cervical carotid artery atherosclerosis without stenosis. 4. Widely patent vertebral arteries. 5. Negative CTP. 6.  Aortic Atherosclerosis (ICD10-I70.0). These results were communicated to Dr.  Rory Percy at 9:55 am on 01/21/2019 by text page via the [facility] messaging system. Electronically Signed   By: Logan Bores M.D.   On: 01/21/2019 10:20   Ct Angio Chest Pe W Or Wo Contrast  Result Date: 02/06/2019 CLINICAL DATA:  Shortness of breath with altered mental status. History of breast carcinoma EXAM: CT ANGIOGRAPHY CHEST WITH CONTRAST TECHNIQUE: Multidetector CT imaging of the chest was performed using the standard protocol during bolus administration of intravenous contrast. Multiplanar CT image reconstructions and MIPs were obtained to evaluate the vascular anatomy. CONTRAST:  46 mL OMNIPAQUE IOHEXOL 350 MG/ML SOLN COMPARISON:  Chest radiograph February 06, 2016 FINDINGS: Cardiovascular: There is no demonstrable pulmonary embolus. There is no thoracic aortic aneurysm. There is no appreciable thoracic aortic dissection. The contrast bolus in the aorta is somewhat less than optimal for dissection assessment. There is aortic atherosclerosis. There are foci of calcification in proximal visualized great vessels. There is no pericardial effusion or pericardial thickening. There are foci of coronary artery calcification. Mediastinum/Nodes: Thyroid appears unremarkable. There is no appreciable thoracic adenopathy. There is a focal hiatal hernia. Lungs/Pleura: There is a degree of underlying centrilobular emphysematous change. No evident pneumothorax. There is ground-glass type opacity in much of the right lower lobe with more subtle areas of ground-glass opacity in the posterior segment of the left lower lobe and in the posterior aspect of the lateral segment right middle lobe. There is no consolidation. No pleural effusion. Upper Abdomen: Gallbladder is absent. There is upper abdominal aortic atherosclerosis. Visualized upper abdominal structures otherwise appear unremarkable. Musculoskeletal: There is anterior wedging of the T12 and T10 vertebral bodies. There are no blastic or lytic bone lesions. No chest wall  lesions are evident. Review of the MIP images confirms the above findings. IMPRESSION: 1. No demonstrable pulmonary embolus. No thoracic aortic aneurysm. No dissection evident with the proviso that contrast bolus in the aorta is less than optimal for dissection assessment. There is aortic atherosclerosis as well as foci of great vessel and coronary artery calcification. 2. Ground-glass type opacity throughout much of the posterior segment of the right lower lobe and to a lesser extent more medially in the right lower lobe. Small foci of ground-glass opacity in the lateral segment right middle lobe and in the posterior segment left lower lobe. Concern for atypical organism pneumonia. No frank consolidation. No pleural effusion. Mild underlying centrilobular emphysematous change. 3.  No evident thoracic adenopathy. 4.  Focal hiatal hernia present. 5.  Gallbladder absent. Aortic Atherosclerosis (ICD10-I70.0) and Emphysema (ICD10-J43.9). Electronically Signed   By: Lowella Grip III M.D.   On: 02/06/2019 15:21   Ct Cervical Spine Wo Contrast  Result Date: 01/18/2019 CLINICAL DATA:  Fall. EXAM: CT HEAD WITHOUT CONTRAST CT CERVICAL SPINE WITHOUT CONTRAST TECHNIQUE: Multidetector CT imaging of the head and cervical spine was performed following the standard protocol without intravenous contrast. Multiplanar CT image reconstructions of the cervical spine were also generated. COMPARISON:  CT paranasal sinuses dated December 20, 2017. FINDINGS: CT HEAD FINDINGS Brain: No evidence of acute infarction, hemorrhage, extra-axial collection or mass lesion/mass effect. Mild generalized cerebral atrophy. Scattered moderate periventricular and subcortical white matter hypodensities are nonspecific, but favored to reflect chronic microvascular ischemic changes. Prominent  lateral ventricles, greater than expected based on the degree of atrophy. Vascular: Calcified atherosclerosis at the skullbase. No hyperdense vessel. Skull:  Normal. Negative for fracture or focal lesion. Sinuses/Orbits: No acute finding. Other: None. CT CERVICAL SPINE FINDINGS Alignment: No traumatic malalignment. Trace stepwise anterolisthesis from C3-C4 through C7-T1. Skull base and vertebrae: No acute fracture. No primary bone lesion or focal pathologic process. Soft tissues and spinal canal: No prevertebral fluid or swelling. No visible canal hematoma. Disc levels: Mild disc height loss at C5-C6. Moderate facet arthropathy throughout the cervical spine. Upper chest: Negative. Other: None. IMPRESSION: CT head: 1.  No acute intracranial abnormality. 2. Mild atrophy and chronic microvascular ischemic changes. 3. Prominent lateral ventricles, greater than expected based on the degree of atrophy. Correlate for normal pressure hydrocephalus. CT cervical spine: 1.  No acute cervical spine fracture. Electronically Signed   By: Titus Dubin M.D.   On: 01/18/2019 17:22   Mr Brain Wo Contrast  Result Date: 01/21/2019 CLINICAL DATA:  Left facial droop. EXAM: MRI HEAD WITHOUT CONTRAST TECHNIQUE: Multiplanar, multiecho pulse sequences of the brain and surrounding structures were obtained without intravenous contrast. COMPARISON:  Head CT, CTA, and CTP 01/21/2019 and MRI 06/26/2012 FINDINGS: The study is mildly motion degraded. Brain: There is no evidence of acute infarct, intracranial hemorrhage, mass, midline shift, or extra-axial fluid collection. Patchy T2 hyperintensities in the cerebral white matter and pons have progressed from 2014 and are nonspecific but compatible with moderate chronic small vessel ischemic disease. Lateral ventriculomegaly, including prominent dilatation of the occipital and temporal horns, has greatly progressed from 2014, and there is milder dilatation of the third and fourth ventricles. Ventricular dilatation is out of proportion to the size of the sulci, and there is mild sulcal crowding at the vertex and narrowing of the callosal angle.  Cerebral atrophy is present including severe bilateral mesial temporal lobe atrophy. Vascular: Major intracranial vascular flow voids are preserved. Skull and upper cervical spine: Unremarkable bone marrow signal. Sinuses/Orbits: Bilateral cataract extraction. Paranasal sinuses and mastoid air cells are clear. Other: None. IMPRESSION: 1. No acute intracranial abnormality. 2. Moderate chronic small vessel ischemic disease and cerebral atrophy. 3. Progressive ventriculomegaly since 2014 which could reflect normal pressure hydrocephalus in the appropriate clinical setting. Electronically Signed   By: Logan Bores M.D.   On: 01/21/2019 14:34   Ct Cerebral Perfusion W Contrast  Result Date: 01/21/2019 CLINICAL DATA:  Left-sided weakness and facial droop. EXAM: CT ANGIOGRAPHY HEAD AND NECK CT PERFUSION BRAIN TECHNIQUE: Multidetector CT imaging of the head and neck was performed using the standard protocol during bolus administration of intravenous contrast. Multiplanar CT image reconstructions and MIPs were obtained to evaluate the vascular anatomy. Carotid stenosis measurements (when applicable) are obtained utilizing NASCET criteria, using the distal internal carotid diameter as the denominator. Multiphase CT imaging of the brain was performed following IV bolus contrast injection. Subsequent parametric perfusion maps were calculated using RAPID software. CONTRAST:  163mL OMNIPAQUE IOHEXOL 350 MG/ML SOLN COMPARISON:  None. FINDINGS: CTA NECK FINDINGS Aortic arch: Standard 3 vessel aortic arch with mild atherosclerotic plaque. Calcified plaque at both subclavian artery origins without significant stenosis. Right carotid system: Patent with small volume calcified plaque at the carotid bifurcation. No evidence of significant stenosis or dissection. Tortuous distal cervical ICA. Left carotid system: Patent with small volume calcified plaque at the carotid bifurcation. No evidence of significant stenosis or dissection.  Vertebral arteries: Patent and codominant without evidence of stenosis or dissection. Skeleton: Moderately advanced multilevel cervical facet  arthrosis with trace multilevel anterolisthesis. Other neck: No evidence of cervical lymphadenopathy or mass. Upper chest: Minimal scarring in the lung apices. Review of the MIP images confirms the above findings CTA HEAD FINDINGS Anterior circulation: The internal carotid arteries are patent from skull base to carotid termini with mild atherosclerotic plaque bilaterally not resulting in significant stenosis. ACAs and MCAs are patent without evidence of proximal branch occlusion or significant proximal stenosis. No aneurysm is identified. Posterior circulation: The intracranial vertebral arteries are patent to the basilar with minimal nonstenotic plaque bilaterally. A patent right PICA and bilateral SCAis are visualized. AICAs and a left PICA are not clearly identified. The basilar artery is widely patent with a small fenestration incidentally noted proximally. There is slight fusiform enlargement of the distal basilar artery at the PCA and SCA origins without a saccular aneurysm. Posterior communicating arteries are diminutive or absent. Both PCAs are patent with branch vessel irregularity bilaterally. Additionally, there are severe right PCA stenoses involving the P2 and proximal P3 segments, and there is a mild proximal left P2 stenosis. No aneurysm is identified. Venous sinuses: Patent. Anatomic variants: None. Review of the MIP images confirms the above findings CT Brain Perfusion Findings: ASPECTS: 10 CBF (<30%) Volume: 9mL Perfusion (Tmax>6.0s) volume: 69mL Mismatch Volume: n/a Infarction Location: n/a IMPRESSION: 1. No emergent large vessel occlusion. 2. Intracranial atherosclerosis including severe right P2 and proximal P3 stenoses. 3. Mild cervical carotid artery atherosclerosis without stenosis. 4. Widely patent vertebral arteries. 5. Negative CTP. 6.  Aortic  Atherosclerosis (ICD10-I70.0). These results were communicated to Dr. Rory Percy at 9:55 am on 01/21/2019 by text page via the [facility] messaging system. Electronically Signed   By: Logan Bores M.D.   On: 01/21/2019 10:20   Dg Chest Portable 1 View  Result Date: 02/06/2019 CLINICAL DATA:  Loss of consciousness, history of breast cancer, Alzheimer's, hypertension EXAM: PORTABLE CHEST 1 VIEW COMPARISON:  Portable exam 1104 hours compared to old 01/18/2019 FINDINGS: Normal heart size, mediastinal contours, and pulmonary vascularity. Atherosclerotic calcification aorta. Bronchitic changes and hyperinflation question COPD. No infiltrate, pleural effusion or pneumothorax. Bones demineralized with dextroconvex thoracolumbar scoliosis. IMPRESSION: Probable COPD changes without acute infiltrate. Aortic atherosclerosis. Electronically Signed   By: Lavonia Dana M.D.   On: 02/06/2019 11:15   Dg C-arm 1-60 Min  Result Date: 01/19/2019 CLINICAL DATA:  The patient suffered a left intertrochanteric fracture in a fall 01/18/2019. Initial encounter. EXAM: LEFT FEMUR 2 VIEWS; DG C-ARM 1-60 MIN COMPARISON:  Plain film of the pelvis 01/18/2019. FINDINGS: Three fluoroscopic spot views of the right hip demonstrate a screw in the femoral neck and short intramedullary nail with a single distal screw for fixation of an intertrochanteric fracture. Position and alignment are near anatomic. No acute abnormality. IMPRESSION: Intraoperative imaging for fixation of an intertrochanteric fracture. Electronically Signed   By: Inge Rise M.D.   On: 01/19/2019 17:52   Dg Femur Min 2 Views Left  Result Date: 01/19/2019 CLINICAL DATA:  The patient suffered a left intertrochanteric fracture in a fall 01/18/2019. Initial encounter. EXAM: LEFT FEMUR 2 VIEWS; DG C-ARM 1-60 MIN COMPARISON:  Plain film of the pelvis 01/18/2019. FINDINGS: Three fluoroscopic spot views of the right hip demonstrate a screw in the femoral neck and short intramedullary  nail with a single distal screw for fixation of an intertrochanteric fracture. Position and alignment are near anatomic. No acute abnormality. IMPRESSION: Intraoperative imaging for fixation of an intertrochanteric fracture. Electronically Signed   By: Inge Rise M.D.   On:  01/19/2019 17:52   Dg Femur Min 2 Views Left  Result Date: 01/18/2019 CLINICAL DATA:  [place].  Left leg pain. EXAM: LEFT FEMUR 2 VIEWS COMPARISON:  None. FINDINGS: Nondisplaced intertrochanteric fracture of the left hip. No femoral shaft fracture. The knee joint is maintained. The visualized left hemipelvis is intact. IMPRESSION: Nondisplaced intertrochanteric fracture of the left hip. Electronically Signed   By: Marijo Sanes M.D.   On: 01/18/2019 16:39   Ct Head Code Stroke Wo Contrast  Result Date: 01/21/2019 CLINICAL DATA:  Code stroke.  Left-sided weakness and facial droop. EXAM: CT HEAD WITHOUT CONTRAST TECHNIQUE: Contiguous axial images were obtained from the base of the skull through the vertex without intravenous contrast. COMPARISON:  01/18/2019 FINDINGS: Brain: There is no evidence of acute infarct, intracranial hemorrhage, mass, midline shift, or extra-axial fluid collection. Moderate dilatation of the lateral ventricles, including of the temporal horns, is unchanged and out of proportion to the size of the sulci. There is sulcal effacement at the vertex, and there is narrowing of the callososeptal angle. Slight enlargement of the third and fourth ventricles is also unchanged. Patchy hypodensities in the cerebral white matter bilaterally are unchanged and nonspecific but compatible with moderate chronic small vessel ischemic disease. A punctate calcification is again seen in the right parietal lobe. Vascular: Calcified atherosclerosis at the skull base. No hyperdense vessel. Skull: No fracture or focal osseous lesion. Sinuses/Orbits: Visualized paranasal sinuses and mastoid air cells are clear. Bilateral cataract  extraction is noted. Other: None. ASPECTS [facility] Stroke Program Early CT Score) - Ganglionic level infarction (caudate, lentiform nuclei, internal capsule, insula, M1-M3 cortex): 7 - Supraganglionic infarction (M4-M6 cortex): 3 Total score (0-10 with 10 being normal): 10 IMPRESSION: 1. No evidence of acute intracranial abnormality. 2. ASPECTS is 10. 3. Unchanged hydrocephalus. 4. Moderate chronic small vessel ischemic disease. These results were communicated to Dr. Rory Percy at 9:11 am on 01/21/2019 by text page via the [facility] messaging system. Electronically Signed   By: Logan Bores M.D.   On: 01/21/2019 09:12   Vas [place] Lower Extremity Venous (dvt)  Result Date: 02/08/2019  Lower Venous Study Indications: Edema, and Covid Positive with elevated d-dimer.  Comparison Study: no priors. Performing Technologist: Oda Cogan RDMS, RVT  Examination Guidelines: A complete evaluation includes B-mode imaging, spectral Doppler, color Doppler, and power Doppler as needed of all accessible portions of each vessel. Bilateral testing is considered an integral part of a complete examination. Limited examinations for reoccurring indications may be performed as noted.  +---------+---------------+---------+-----------+----------+--------------+ RIGHT    CompressibilityPhasicitySpontaneityPropertiesThrombus Aging +---------+---------------+---------+-----------+----------+--------------+ CFV      Full           Yes      Yes                                 +---------+---------------+---------+-----------+----------+--------------+ SFJ      Full                                                        +---------+---------------+---------+-----------+----------+--------------+ FV Prox  Full                                                        +---------+---------------+---------+-----------+----------+--------------+  FV Mid   Full                                                         +---------+---------------+---------+-----------+----------+--------------+ FV DistalFull                                                        +---------+---------------+---------+-----------+----------+--------------+ PFV      Full                                                        +---------+---------------+---------+-----------+----------+--------------+ POP      Full           Yes      Yes                                 +---------+---------------+---------+-----------+----------+--------------+ PTV      Full                                                        +---------+---------------+---------+-----------+----------+--------------+ PERO     Full                                                        +---------+---------------+---------+-----------+----------+--------------+   +---------+---------------+---------+-----------+----------+--------------+ LEFT     CompressibilityPhasicitySpontaneityPropertiesThrombus Aging +---------+---------------+---------+-----------+----------+--------------+ CFV      Full           Yes      Yes                                 +---------+---------------+---------+-----------+----------+--------------+ SFJ      Full                                                        +---------+---------------+---------+-----------+----------+--------------+ FV Prox  Full                                                        +---------+---------------+---------+-----------+----------+--------------+ FV Mid   Full                                                        +---------+---------------+---------+-----------+----------+--------------+  FV DistalFull                                                        +---------+---------------+---------+-----------+----------+--------------+ PFV      Full                                                         +---------+---------------+---------+-----------+----------+--------------+ POP      Full           Yes      Yes                                 +---------+---------------+---------+-----------+----------+--------------+ PTV      Full                                                        +---------+---------------+---------+-----------+----------+--------------+ PERO     Full                                                        +---------+---------------+---------+-----------+----------+--------------+     Summary: Right: There is no evidence of deep vein thrombosis in the lower extremity. No cystic structure found in the popliteal fossa. Left: There is no evidence of deep vein thrombosis in the lower extremity. No cystic structure found in the popliteal fossa.  *See table(s) above for measurements and observations. Electronically signed by Servando Snare MD on 02/08/2019 at 4:29:00 PM.    Final     Subjective: No complaints, denies SOB or pain anywhere.  Discharge Exam: Vitals:   02/10/19 1948 02/11/19 0424  BP: 119/74 140/68  Pulse: 73 65  Resp: 14 16  Temp: 98 F (36.7 C) 97.8 F (36.6 C)  SpO2: 96% 99%   General: No distress Cardiovascular: Regular, no edema Respiratory: Nonlabored on room air, clear  Labs: BNP (last 3 results) Recent Labs    02/06/19 1758  BNP [id number]   Basic Metabolic Panel: Recent Labs  Lab 02/07/19 0330 02/08/19 0335 02/09/19 0017 02/10/19 0911 02/11/19 0119  NA 138 141 138 134* 133*  K 3.3* 3.1* 3.1* 4.7 4.0  CL 111 109 107 104 101  CO2 18* 21* 21* 21* 23  GLUCOSE 72 106* 93 82 88  BUN 23 12 5* 7* 12  CREATININE 0.91 0.75 0.69 0.67 0.71  CALCIUM 8.2* 8.8* 8.9 9.0 9.2  MG  --   --  1.4* 1.7  --    Liver Function Tests: Recent Labs  Lab 02/07/19 0330 02/08/19 0335 02/09/19 0017 02/10/19 0911 02/11/19 0119  AST 30 27 25 24 22   ALT 23 21 21 20 20   ALKPHOS 97 99 99 92 91  BILITOT 0.2* 0.3 0.5 0.4 0.6  PROT 5.7* 6.6 6.5  6.5 6.5  ALBUMIN 2.7* 3.1*  3.0* 2.9* 3.0*   Recent Labs  Lab 02/06/19 1036 02/07/19 0330  LIPASE 166* 94*   No results for input(s): AMMONIA in the last 168 hours. CBC: Recent Labs  Lab 02/07/19 0500 02/08/19 0335 02/09/19 0017 02/10/19 0911 02/11/19 0119  WBC 3.6* 3.8* 4.2 3.2* 3.5*  NEUTROABS 2.1 2.7 3.1 2.0 2.2  HGB 9.3* 9.8* 9.8* 9.5* 10.0*  HCT 29.6* 30.9* 30.1* 29.2* 31.0*  MCV 93.4 92.2 90.7 90.1 89.9  PLT 147* 150 145* 152 169   Cardiac Enzymes: No results for input(s): CKTOTAL, CKMB, CKMBINDEX, TROPONINI in the last 168 hours. BNP: Invalid input(s): POCBNP CBG: Recent Labs  Lab 02/06/19 1150  GLUCAP 101*   D-Dimer Recent Labs    02/10/19 0911 02/11/19 0119  DDIMER 2.13* 1.79*   Hgb A1c No results for input(s): HGBA1C in the last 72 hours. Lipid Profile No results for input(s): CHOL, HDL, LDLCALC, TRIG, CHOLHDL, LDLDIRECT in the last 72 hours. Thyroid function studies No results for input(s): TSH, T4TOTAL, T3FREE, THYROIDAB in the last 72 hours.  Invalid input(s): FREET3 Anemia work up No results for input(s): VITAMINB12, FOLATE, FERRITIN, TIBC, IRON, RETICCTPCT in the last 72 hours. Urinalysis No results found for: COLORURINE, APPEARANCEUR, LABSPEC, [place], GLUCOSEU, [place], [place], KETONESUR, PROTEINUR, UROBILINOGEN, NITRITE, LEUKOCYTESUR  Microbiology Recent Results (from the past 240 hour(s))  SARS CORONAVIRUS 2 (TAT 6-24 HRS) Nasopharyngeal Nasopharyngeal Swab     Status: Abnormal   Collection Time: 02/06/19 12:46 PM   Specimen: Nasopharyngeal Swab  Result Value Ref Range Status   SARS Coronavirus 2 POSITIVE (A) NEGATIVE Final    Comment: RESULT CALLED TO, READ BACK BY AND VERIFIED WITH: H VANKRETSCHMAR,RN 1723 02/06/2019 D BRADLEY (NOTE) SARS-CoV-2 target nucleic acids are DETECTED. The SARS-CoV-2 RNA is generally detectable in upper and lower respiratory specimens during the acute phase of infection. Positive results are  indicative of active infection with SARS-CoV-2. Clinical  correlation with patient history and other diagnostic information is necessary to determine patient infection status. Positive results do  not rule out bacterial infection or co-infection with other viruses. The expected result is Negative. Fact Sheet for Patients: SugarRoll.be Fact Sheet for Healthcare Providers: https://www.woods-mathews.com/ This test is not yet approved or cleared by the [place] FDA and  has been authorized for detection and/or diagnosis of SARS-CoV-2 by FDA under an Emergency Use Authorization (EUA). This EUA will remain  in effect (meaning this test can be [place] ed) for the duration of the COVID-19 declaration under Section 564(b)(1) of the Act, 21 U.S.C. section 360bbb-3(b)(1), unless the authorization is terminated or revoked sooner. Performed at [facility], [place] [address], [place], [place] [address]   Blood culture (routine x 2)     Status: None   Collection Time: 02/06/19  5:58 PM   Specimen: BLOOD  Result Value Ref Range Status   Specimen Description BLOOD LEFT ANTECUBITAL  Final   Special Requests   Final    BOTTLES DRAWN AEROBIC AND ANAEROBIC Blood Culture adequate volume   Culture   Final    NO GROWTH 5 DAYS Performed at [facility], [place] [address], [place], [place] [address]    Report Status 02/11/2019 FINAL  Final  Blood culture (routine x 2)     Status: None   Collection Time: 02/06/19  6:07 PM   Specimen: BLOOD LEFT FOREARM  Result Value Ref Range Status   Specimen Description BLOOD LEFT FOREARM  Final   Special Requests   Final  BOTTLES DRAWN AEROBIC ONLY Blood Culture results may not be optimal due to an inadequate volume of blood received in culture bottles   Culture   Final    NO GROWTH 5 DAYS Performed at [facility], [place] [address], [place], [place] [address]    Report Status 02/11/2019 FINAL  Final    Time  coordinating discharge: Approximately 40 minutes  Patrecia Pour, MD  [facility] 02/11/2019, 7:31 AM

## 2019-02-11 NOTE — Discharge Planning (Signed)
Attempted to call [facility] 3x with no answer. Called [phone] which was provided by SW and also confirmed # by going to facility website.  RN assessment and VS revealed stability for DC to facility. PTAR arrived while I was at lunch, but IV was removed.  Discharge paperwork and signed [place] was sent to facility.  Daughter was contacted by SW to inform of discharge.

## 2019-02-12 DIAGNOSIS — S72009D Fracture of unspecified part of neck of unspecified femur, subsequent encounter for closed fracture with routine healing: Secondary | ICD-10-CM | POA: Diagnosis not present

## 2019-02-12 DIAGNOSIS — U071 COVID-19: Secondary | ICD-10-CM | POA: Diagnosis not present

## 2019-02-12 DIAGNOSIS — R69 Illness, unspecified: Secondary | ICD-10-CM | POA: Diagnosis not present

## 2019-02-26 DIAGNOSIS — R6889 Other general symptoms and signs: Secondary | ICD-10-CM | POA: Diagnosis not present

## 2019-02-26 DIAGNOSIS — A0472 Enterocolitis due to Clostridium difficile, not specified as recurrent: Secondary | ICD-10-CM | POA: Diagnosis not present

## 2019-03-13 DIAGNOSIS — R1312 Dysphagia, oropharyngeal phase: Secondary | ICD-10-CM | POA: Diagnosis not present

## 2019-03-13 DIAGNOSIS — E876 Hypokalemia: Secondary | ICD-10-CM | POA: Diagnosis not present

## 2019-03-13 DIAGNOSIS — S72042G Displaced fracture of base of neck of left femur, subsequent encounter for closed fracture with delayed healing: Secondary | ICD-10-CM | POA: Diagnosis not present

## 2019-03-13 DIAGNOSIS — D61818 Other pancytopenia: Secondary | ICD-10-CM | POA: Diagnosis not present

## 2019-03-13 DIAGNOSIS — N179 Acute kidney failure, unspecified: Secondary | ICD-10-CM | POA: Diagnosis not present

## 2019-03-13 DIAGNOSIS — G309 Alzheimer's disease, unspecified: Secondary | ICD-10-CM | POA: Diagnosis not present

## 2019-03-13 DIAGNOSIS — R69 Illness, unspecified: Secondary | ICD-10-CM | POA: Diagnosis not present

## 2019-03-13 DIAGNOSIS — D696 Thrombocytopenia, unspecified: Secondary | ICD-10-CM | POA: Diagnosis not present

## 2019-03-13 DIAGNOSIS — R262 Difficulty in walking, not elsewhere classified: Secondary | ICD-10-CM | POA: Diagnosis not present

## 2019-03-13 DIAGNOSIS — R41841 Cognitive communication deficit: Secondary | ICD-10-CM | POA: Diagnosis not present

## 2019-03-15 DIAGNOSIS — R41841 Cognitive communication deficit: Secondary | ICD-10-CM | POA: Diagnosis not present

## 2019-03-15 DIAGNOSIS — R1312 Dysphagia, oropharyngeal phase: Secondary | ICD-10-CM | POA: Diagnosis not present

## 2019-03-15 DIAGNOSIS — R262 Difficulty in walking, not elsewhere classified: Secondary | ICD-10-CM | POA: Diagnosis not present

## 2019-03-15 DIAGNOSIS — S72042G Displaced fracture of base of neck of left femur, subsequent encounter for closed fracture with delayed healing: Secondary | ICD-10-CM | POA: Diagnosis not present

## 2019-03-19 DIAGNOSIS — S72042G Displaced fracture of base of neck of left femur, subsequent encounter for closed fracture with delayed healing: Secondary | ICD-10-CM | POA: Diagnosis not present

## 2019-03-19 DIAGNOSIS — R197 Diarrhea, unspecified: Secondary | ICD-10-CM | POA: Diagnosis not present

## 2019-03-19 DIAGNOSIS — R41841 Cognitive communication deficit: Secondary | ICD-10-CM | POA: Diagnosis not present

## 2019-03-19 DIAGNOSIS — R1312 Dysphagia, oropharyngeal phase: Secondary | ICD-10-CM | POA: Diagnosis not present

## 2019-03-19 DIAGNOSIS — R262 Difficulty in walking, not elsewhere classified: Secondary | ICD-10-CM | POA: Diagnosis not present

## 2019-03-20 DIAGNOSIS — A0472 Enterocolitis due to Clostridium difficile, not specified as recurrent: Secondary | ICD-10-CM | POA: Diagnosis not present

## 2019-03-20 DIAGNOSIS — R6889 Other general symptoms and signs: Secondary | ICD-10-CM | POA: Diagnosis not present

## 2019-03-20 DIAGNOSIS — D649 Anemia, unspecified: Secondary | ICD-10-CM | POA: Diagnosis not present

## 2019-04-13 DEATH — deceased

## 2020-05-18 IMAGING — CT CT HEAD W/O CM
4 series · 17 of 47 positions shown, 19 images · non-contrast
Comparison: January 21, 2019

CLINICAL DATA: Syncopal episode

EXAM:
CT HEAD WITHOUT CONTRAST
TECHNIQUE: Contiguous axial images were obtained from the base of the skull
through the vertex without intravenous contrast.

[Series 3: head wo · axial · 0.42mm/px · z∈[+914,+1034]mm · 7 of 34 slices shown, 9 images]
[im 5/34  brain]
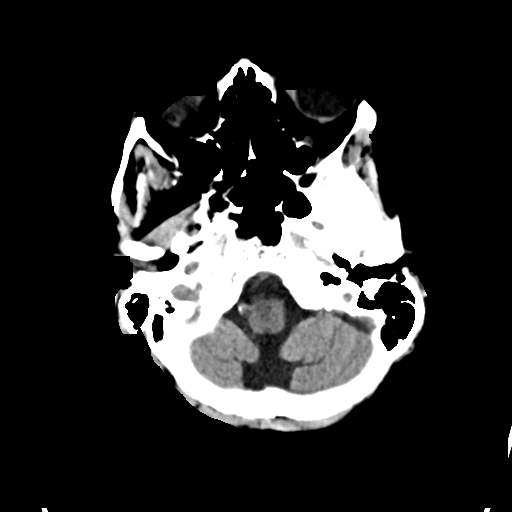
[im 5/34  bone]
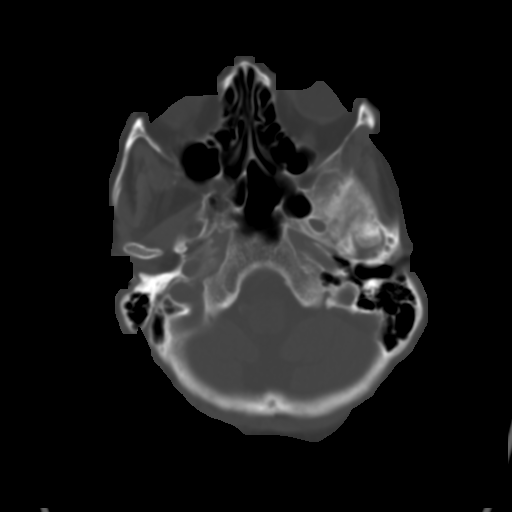
[im 9/34  brain]
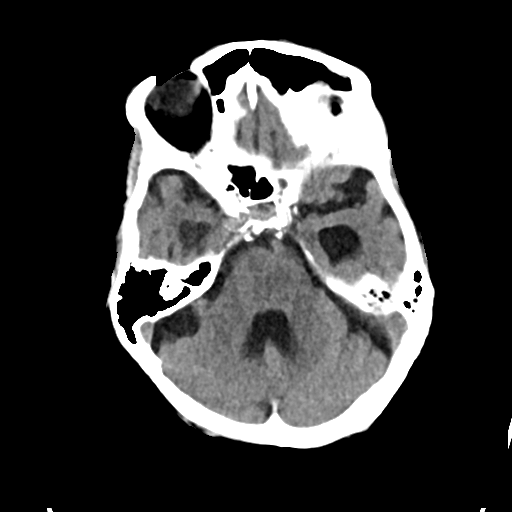
[im 13/34  brain]
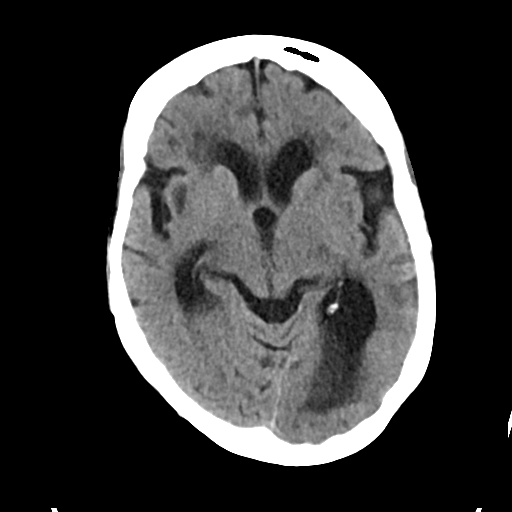
[im 17/34  brain]
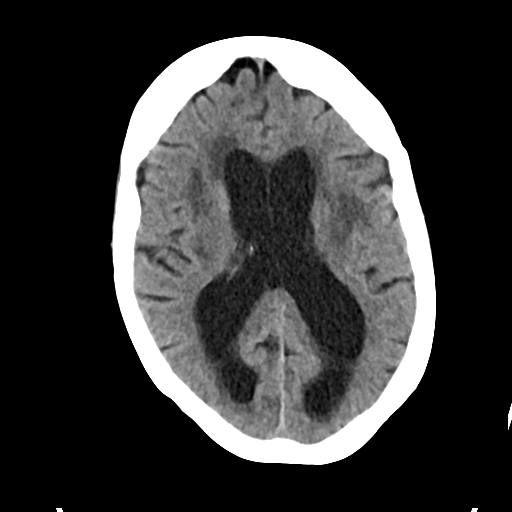
[im 21/34  brain]
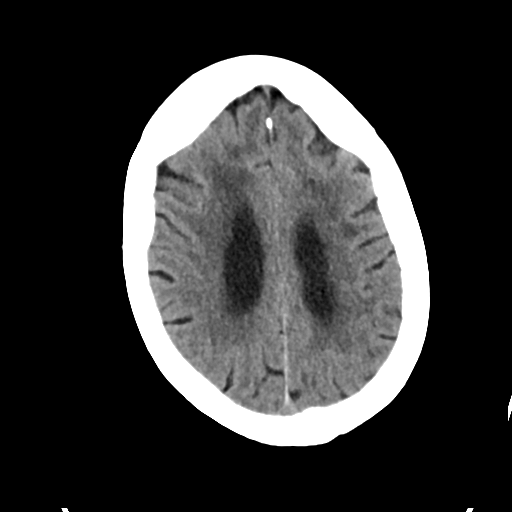
[im 21/34  bone]
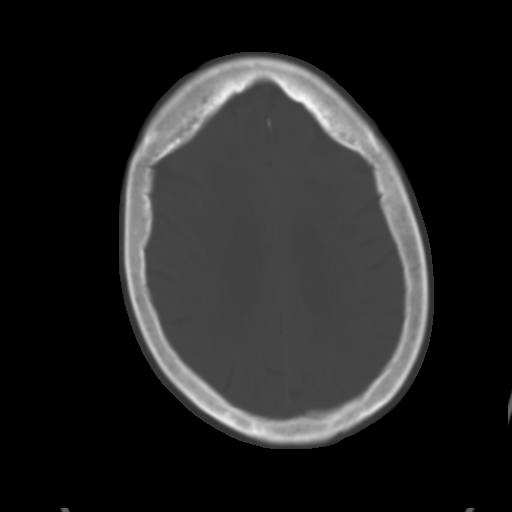
[im 25/34  brain]
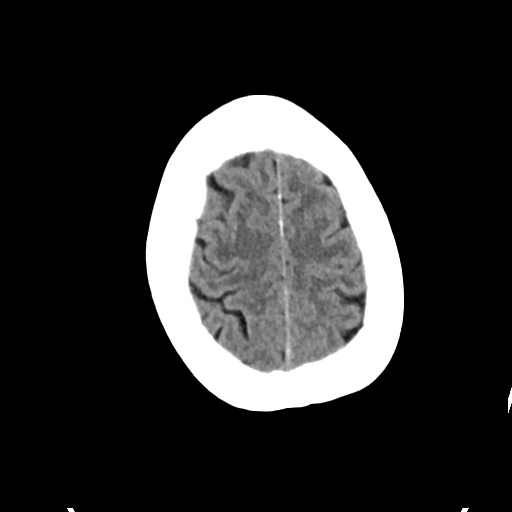
[im 29/34  brain]
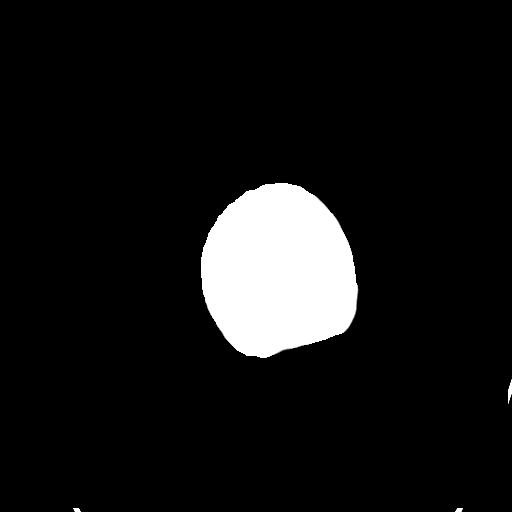

[Series 4: head bone · axial · 0.42mm/px · z∈[+910,+966]mm · 4 of 83 slices shown]
[im 9/83  bone]
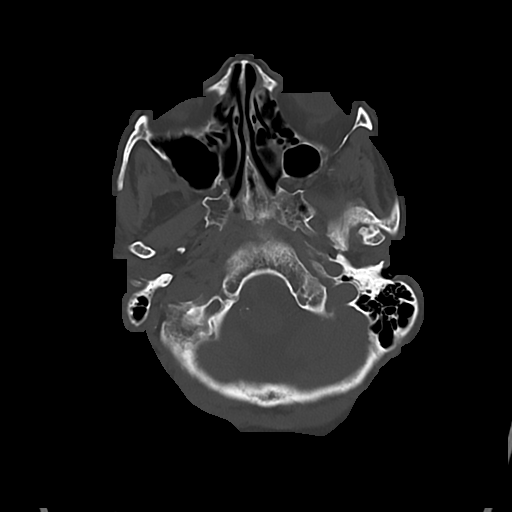
[im 17/83  bone]
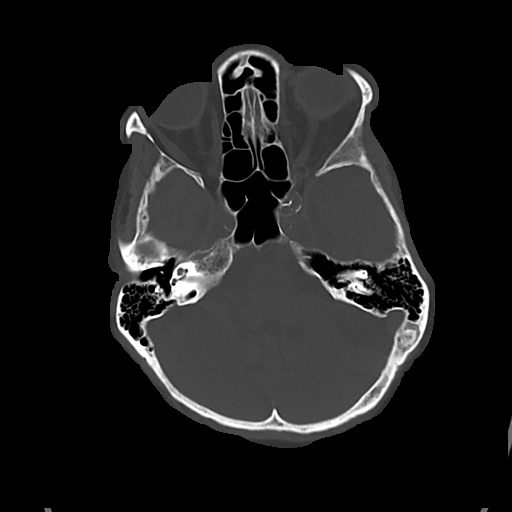
[im 25/83  bone]
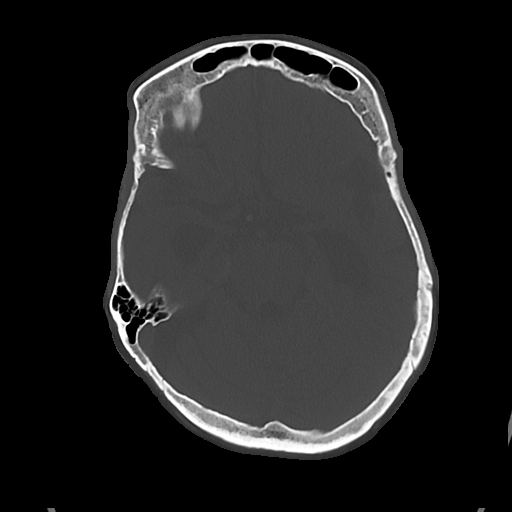
[im 37/83  bone]
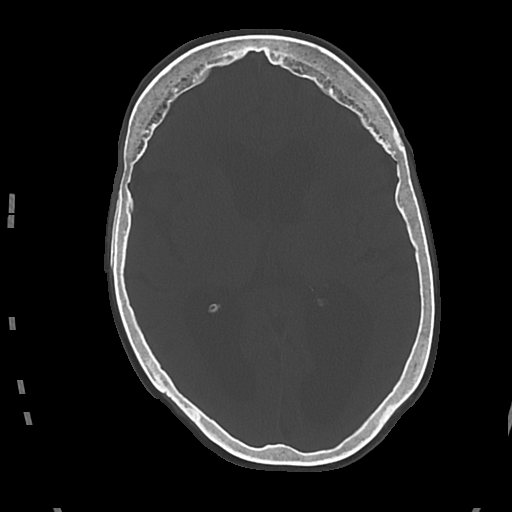

[Series 5: cor soft · coronal · 0.33mm/px · 3 of 68 slices shown]
[im 23/68  brain]
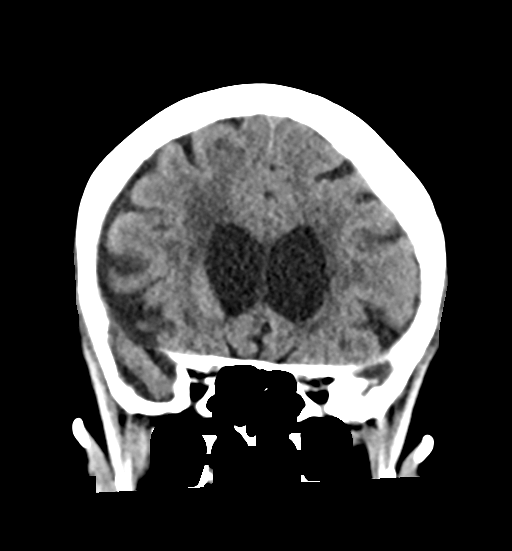
[im 30/68  brain]
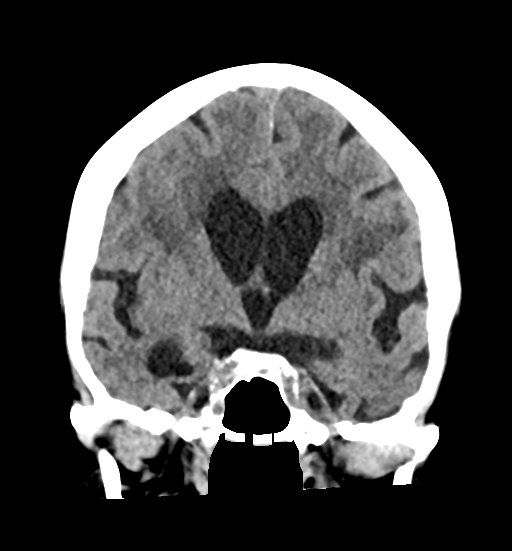
[im 38/68  brain]
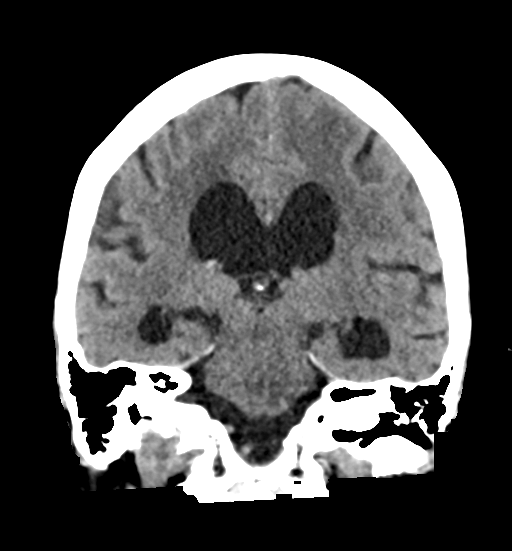

[Series 6: sag soft · sagittal · 0.33mm/px · 3 of 56 slices shown]
[im 19/56  brain]
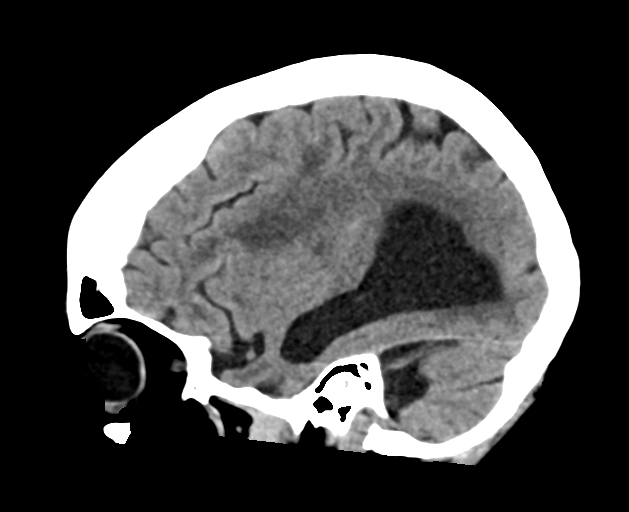
[im 28/56  brain]
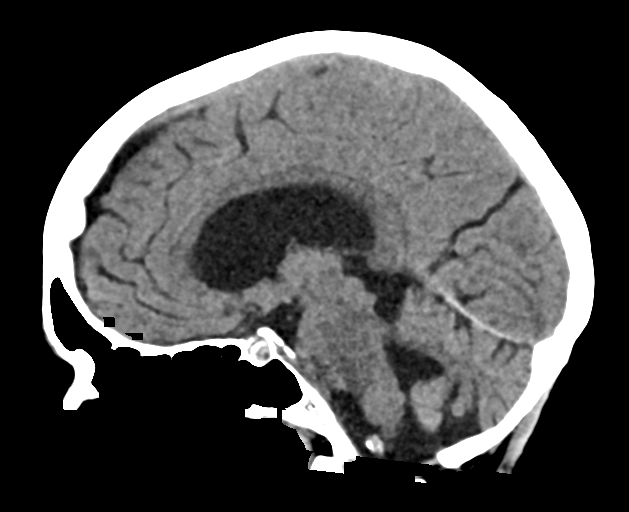
[im 37/56  brain]
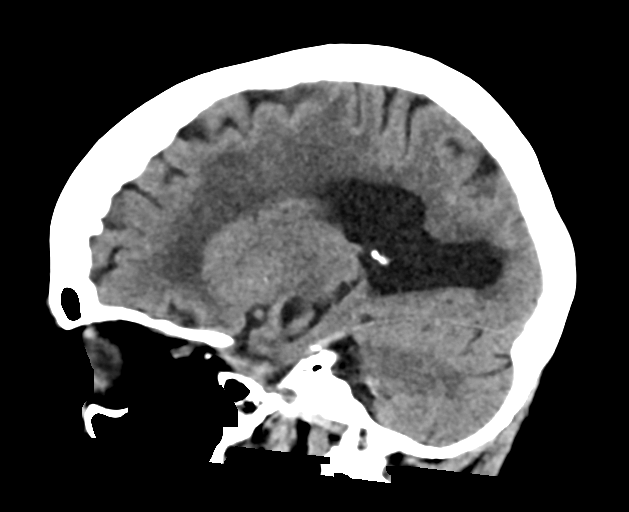

[17 of 47 positions shown; findings below may reference images not displayed]

FINDINGS: Brain: There is stable disproportionate enlargement of the
ventricles. Patchy and confluent areas of hypoattenuation in the
supratentorial white matter are nonspecific but likely reflect
stable chronic microvascular ischemic changes. There is no acute
intracranial hemorrhage, mass-effect, or edema. Gray-white
differentiation is preserved. There is no extra-axial fluid
collection.

Vascular: No hyperdense vessel or unexpected calcification.There is
atherosclerotic calcification at the skull base.

Skull: Calvarium is unremarkable.

Sinuses/Orbits: No acute finding.

Other: None.
IMPRESSION: No acute intracranial hemorrhage, mass effect, or evidence of acute
infarction. Stable chronic findings as detailed above.

## 2020-05-18 IMAGING — DX DG CHEST 1V PORT
1 series · 1 of 1 positions shown · non-contrast
Comparison: Portable exam 1183 hours compared to old 01/18/2019

CLINICAL DATA: Loss of consciousness, history of breast cancer,
Alzheimer's, hypertension

EXAM:
PORTABLE CHEST 1 VIEW

[chest ap]
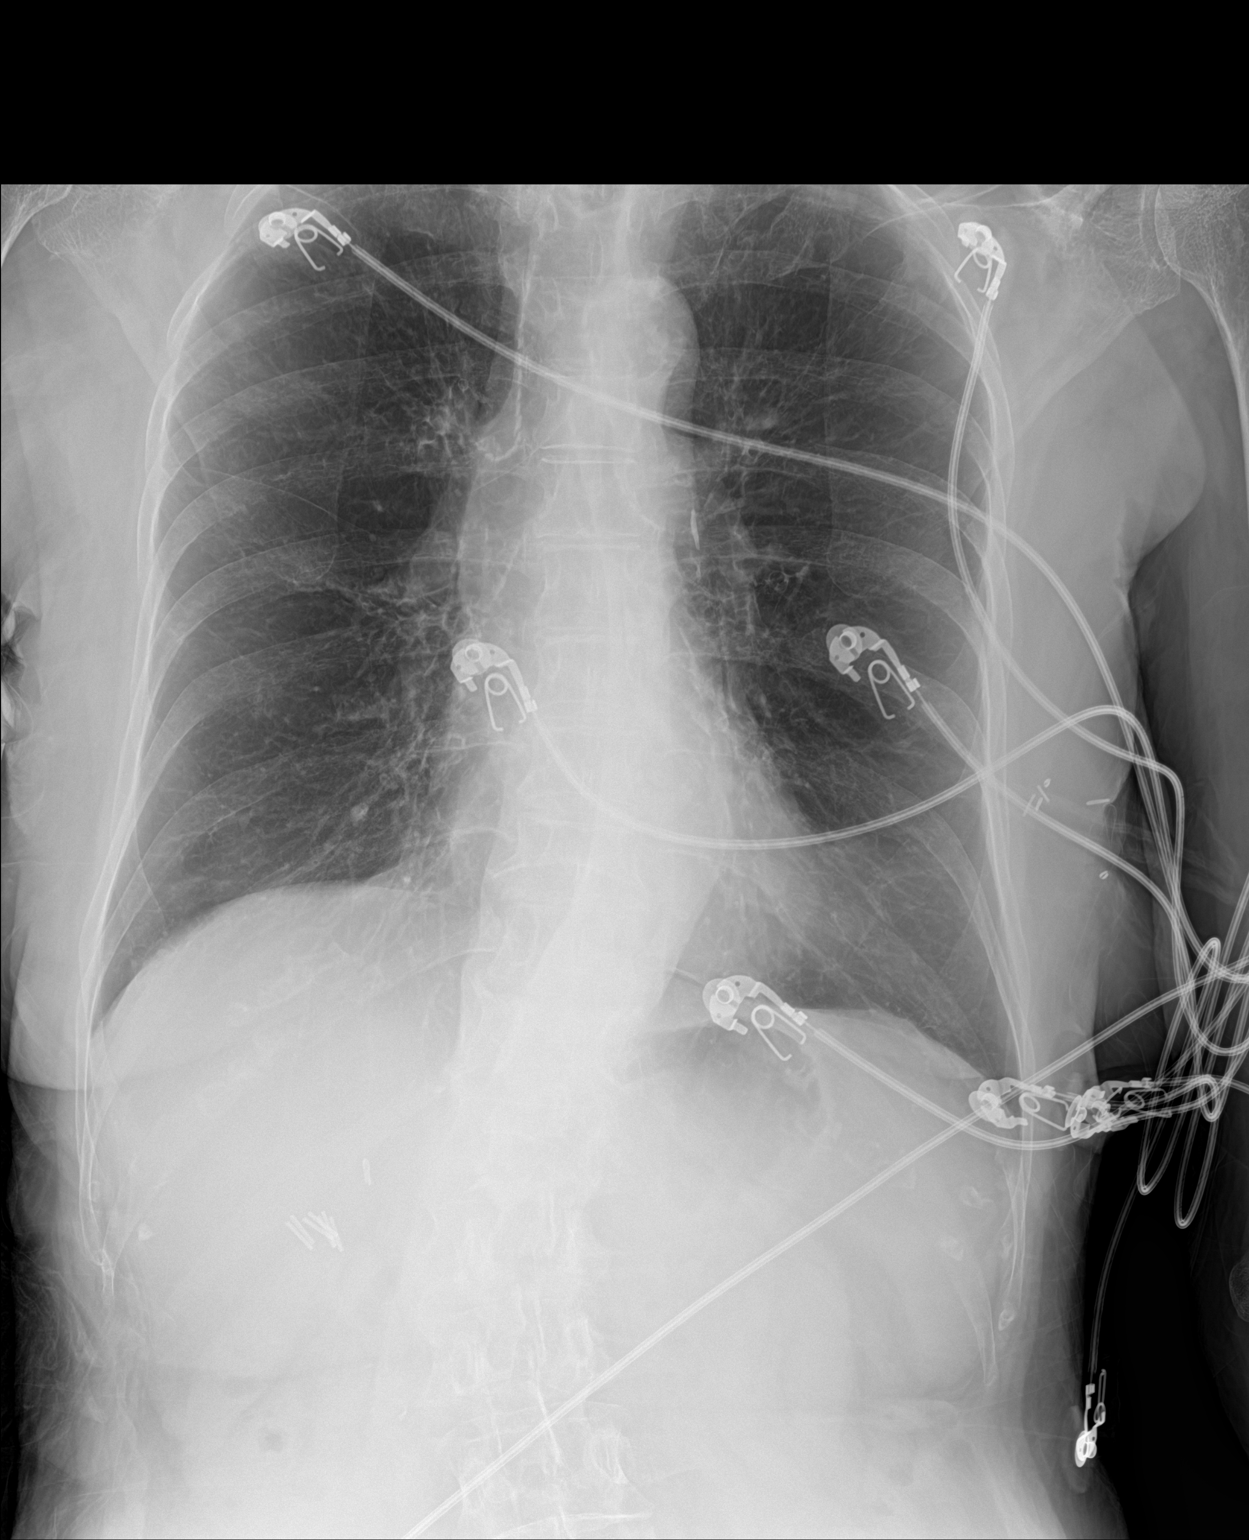

[1 of 1 positions shown; findings below may reference images not displayed]

FINDINGS: Normal heart size, mediastinal contours, and pulmonary vascularity.

Atherosclerotic calcification aorta.

Bronchitic changes and hyperinflation question COPD.

No infiltrate, pleural effusion or pneumothorax.

Bones demineralized with dextroconvex thoracolumbar scoliosis.
IMPRESSION: Probable COPD changes without acute infiltrate.

Aortic atherosclerosis.
